# Patient Record
Sex: Male | Born: 1935 | Race: White | Hispanic: No | Smoking: Former smoker
Health system: Southern US, Community
[De-identification: ages and names within clinical notes are randomized; demographics above are authoritative.]

## PROBLEM LIST (undated history)

## (undated) DIAGNOSIS — K56609 Unspecified intestinal obstruction, unspecified as to partial versus complete obstruction: Secondary | ICD-10-CM

## (undated) DIAGNOSIS — I639 Cerebral infarction, unspecified: Secondary | ICD-10-CM

## (undated) DIAGNOSIS — I10 Essential (primary) hypertension: Secondary | ICD-10-CM

## (undated) DIAGNOSIS — C8590 Non-Hodgkin lymphoma, unspecified, unspecified site: Secondary | ICD-10-CM

## (undated) DIAGNOSIS — E119 Type 2 diabetes mellitus without complications: Secondary | ICD-10-CM

## (undated) HISTORY — PX: APPENDECTOMY: SHX54

## (undated) HISTORY — PX: GALLBLADDER SURGERY: SHX652

## (undated) HISTORY — PX: CORONARY ARTERY BYPASS GRAFT: SHX141

## (undated) HISTORY — PX: SPLENECTOMY: SUR1306

## (undated) HISTORY — DX: Cerebral infarction, unspecified: I63.9

---

## 2004-10-03 ENCOUNTER — Ambulatory Visit: Payer: Self-pay | Admitting: Oncology

## 2004-10-22 ENCOUNTER — Ambulatory Visit: Payer: Self-pay | Admitting: Oncology

## 2005-01-02 ENCOUNTER — Ambulatory Visit: Payer: Self-pay | Admitting: Oncology

## 2005-01-22 ENCOUNTER — Ambulatory Visit: Payer: Self-pay | Admitting: Oncology

## 2005-02-19 ENCOUNTER — Ambulatory Visit: Payer: Self-pay | Admitting: Oncology

## 2005-03-22 ENCOUNTER — Ambulatory Visit: Payer: Self-pay | Admitting: Oncology

## 2005-05-08 ENCOUNTER — Ambulatory Visit: Payer: Self-pay | Admitting: Oncology

## 2005-05-22 ENCOUNTER — Ambulatory Visit: Payer: Self-pay | Admitting: Oncology

## 2005-08-07 ENCOUNTER — Ambulatory Visit: Payer: Self-pay | Admitting: Oncology

## 2005-08-22 ENCOUNTER — Ambulatory Visit: Payer: Self-pay | Admitting: Oncology

## 2005-11-05 ENCOUNTER — Ambulatory Visit: Payer: Self-pay | Admitting: Oncology

## 2005-11-21 ENCOUNTER — Ambulatory Visit: Payer: Self-pay | Admitting: Oncology

## 2006-03-05 ENCOUNTER — Ambulatory Visit: Payer: Self-pay | Admitting: Oncology

## 2006-03-22 ENCOUNTER — Ambulatory Visit: Payer: Self-pay | Admitting: Oncology

## 2006-09-01 ENCOUNTER — Ambulatory Visit: Payer: Self-pay | Admitting: Ophthalmology

## 2006-09-09 ENCOUNTER — Ambulatory Visit: Payer: Self-pay | Admitting: Ophthalmology

## 2006-10-16 ENCOUNTER — Ambulatory Visit: Payer: Self-pay | Admitting: Oncology

## 2006-10-22 ENCOUNTER — Ambulatory Visit: Payer: Self-pay | Admitting: Oncology

## 2007-03-23 ENCOUNTER — Ambulatory Visit: Payer: Self-pay | Admitting: Oncology

## 2007-04-15 ENCOUNTER — Ambulatory Visit: Payer: Self-pay | Admitting: Oncology

## 2007-04-22 ENCOUNTER — Ambulatory Visit: Payer: Self-pay | Admitting: Oncology

## 2007-06-15 ENCOUNTER — Other Ambulatory Visit: Payer: Self-pay

## 2007-06-15 ENCOUNTER — Ambulatory Visit: Payer: Self-pay | Admitting: Ophthalmology

## 2007-06-23 ENCOUNTER — Ambulatory Visit: Payer: Self-pay | Admitting: Ophthalmology

## 2007-09-22 ENCOUNTER — Ambulatory Visit: Payer: Self-pay | Admitting: Oncology

## 2007-10-14 ENCOUNTER — Ambulatory Visit: Payer: Self-pay | Admitting: Oncology

## 2007-10-23 ENCOUNTER — Ambulatory Visit: Payer: Self-pay | Admitting: Oncology

## 2008-03-22 ENCOUNTER — Ambulatory Visit: Payer: Self-pay | Admitting: Oncology

## 2008-04-14 ENCOUNTER — Ambulatory Visit: Payer: Self-pay | Admitting: Oncology

## 2008-04-21 ENCOUNTER — Ambulatory Visit: Payer: Self-pay | Admitting: Oncology

## 2008-10-22 ENCOUNTER — Ambulatory Visit: Payer: Self-pay | Admitting: Oncology

## 2008-10-26 ENCOUNTER — Ambulatory Visit: Payer: Self-pay | Admitting: Oncology

## 2008-11-21 ENCOUNTER — Ambulatory Visit: Payer: Self-pay | Admitting: Oncology

## 2009-02-19 ENCOUNTER — Ambulatory Visit: Payer: Self-pay | Admitting: Oncology

## 2009-02-27 ENCOUNTER — Ambulatory Visit: Payer: Self-pay | Admitting: Oncology

## 2009-03-22 ENCOUNTER — Ambulatory Visit: Payer: Self-pay | Admitting: Oncology

## 2009-04-17 ENCOUNTER — Ambulatory Visit: Payer: Self-pay | Admitting: Unknown Physician Specialty

## 2009-04-21 ENCOUNTER — Ambulatory Visit: Payer: Self-pay | Admitting: Oncology

## 2009-05-22 ENCOUNTER — Ambulatory Visit: Payer: Self-pay | Admitting: Oncology

## 2009-06-21 ENCOUNTER — Ambulatory Visit: Payer: Self-pay | Admitting: Oncology

## 2009-07-22 ENCOUNTER — Ambulatory Visit: Payer: Self-pay | Admitting: Oncology

## 2009-08-22 ENCOUNTER — Ambulatory Visit: Payer: Self-pay | Admitting: Internal Medicine

## 2009-08-30 ENCOUNTER — Ambulatory Visit: Payer: Self-pay | Admitting: Internal Medicine

## 2009-09-21 ENCOUNTER — Ambulatory Visit: Payer: Self-pay | Admitting: Internal Medicine

## 2009-10-22 ENCOUNTER — Ambulatory Visit: Payer: Self-pay | Admitting: Internal Medicine

## 2009-11-21 ENCOUNTER — Ambulatory Visit: Payer: Self-pay | Admitting: Internal Medicine

## 2009-12-06 ENCOUNTER — Ambulatory Visit: Payer: Self-pay | Admitting: Internal Medicine

## 2009-12-22 ENCOUNTER — Ambulatory Visit: Payer: Self-pay | Admitting: Internal Medicine

## 2010-01-22 ENCOUNTER — Ambulatory Visit: Payer: Self-pay | Admitting: Internal Medicine

## 2010-01-29 ENCOUNTER — Ambulatory Visit: Payer: Self-pay | Admitting: Internal Medicine

## 2010-02-19 ENCOUNTER — Ambulatory Visit: Payer: Self-pay | Admitting: Internal Medicine

## 2010-03-22 ENCOUNTER — Ambulatory Visit: Payer: Self-pay | Admitting: Internal Medicine

## 2010-03-26 ENCOUNTER — Ambulatory Visit: Payer: Self-pay | Admitting: Internal Medicine

## 2010-04-21 ENCOUNTER — Ambulatory Visit: Payer: Self-pay | Admitting: Internal Medicine

## 2010-06-21 ENCOUNTER — Ambulatory Visit: Payer: Self-pay | Admitting: Internal Medicine

## 2010-07-04 ENCOUNTER — Ambulatory Visit: Payer: Self-pay | Admitting: Internal Medicine

## 2010-07-22 ENCOUNTER — Ambulatory Visit: Payer: Self-pay | Admitting: Internal Medicine

## 2010-10-08 ENCOUNTER — Ambulatory Visit: Payer: Self-pay | Admitting: Internal Medicine

## 2010-10-22 ENCOUNTER — Ambulatory Visit: Payer: Self-pay | Admitting: Internal Medicine

## 2011-02-11 ENCOUNTER — Ambulatory Visit: Payer: Self-pay | Admitting: Internal Medicine

## 2011-02-20 ENCOUNTER — Ambulatory Visit: Payer: Self-pay | Admitting: Internal Medicine

## 2011-04-01 ENCOUNTER — Emergency Department: Payer: Self-pay | Admitting: Emergency Medicine

## 2011-08-12 ENCOUNTER — Ambulatory Visit: Payer: Self-pay | Admitting: Internal Medicine

## 2011-08-23 ENCOUNTER — Ambulatory Visit: Payer: Self-pay | Admitting: Internal Medicine

## 2012-02-12 ENCOUNTER — Ambulatory Visit: Payer: Self-pay | Admitting: Oncology

## 2012-02-12 LAB — RETICULOCYTES
Absolute Retic Count: 0.096 10*6/uL — ABNORMAL HIGH (ref 0.024–0.084)
Reticulocyte: 2 % — ABNORMAL HIGH (ref 0.5–1.5)

## 2012-02-12 LAB — CBC CANCER CENTER
Basophil #: 0 x10 3/mm (ref 0.0–0.1)
Basophil %: 0.5 %
Eosinophil #: 0.2 x10 3/mm (ref 0.0–0.7)
Eosinophil %: 2.1 %
Lymphocyte #: 2.9 x10 3/mm (ref 1.0–3.6)
MCV: 94 fL (ref 80–100)
Monocyte #: 0.8 x10 3/mm — ABNORMAL HIGH (ref 0.0–0.7)
Neutrophil #: 3.4 x10 3/mm (ref 1.4–6.5)
Neutrophil %: 47 %
Platelet: 264 x10 3/mm (ref 150–440)
RBC: 4.92 10*6/uL (ref 4.40–5.90)
RDW: 13.9 % (ref 11.5–14.5)
WBC: 7.3 x10 3/mm (ref 3.8–10.6)

## 2012-02-12 LAB — COMPREHENSIVE METABOLIC PANEL
Albumin: 3.8 g/dL (ref 3.4–5.0)
Alkaline Phosphatase: 73 U/L (ref 50–136)
Anion Gap: 6 — ABNORMAL LOW (ref 7–16)
BUN: 19 mg/dL — ABNORMAL HIGH (ref 7–18)
Bilirubin,Total: 0.5 mg/dL (ref 0.2–1.0)
Co2: 30 mmol/L (ref 21–32)
Creatinine: 1.36 mg/dL — ABNORMAL HIGH (ref 0.60–1.30)
EGFR (Non-African Amer.): 53 — ABNORMAL LOW
Glucose: 188 mg/dL — ABNORMAL HIGH (ref 65–99)
Osmolality: 283 (ref 275–301)
Potassium: 4.2 mmol/L (ref 3.5–5.1)
SGPT (ALT): 41 U/L
Sodium: 138 mmol/L (ref 136–145)
Total Protein: 7.1 g/dL (ref 6.4–8.2)

## 2012-02-20 ENCOUNTER — Ambulatory Visit: Payer: Self-pay | Admitting: Oncology

## 2012-09-02 ENCOUNTER — Ambulatory Visit: Payer: Self-pay | Admitting: Oncology

## 2012-09-02 LAB — COMPREHENSIVE METABOLIC PANEL
Alkaline Phosphatase: 70 U/L (ref 50–136)
BUN: 17 mg/dL (ref 7–18)
Bilirubin,Total: 0.6 mg/dL (ref 0.2–1.0)
Creatinine: 1.17 mg/dL (ref 0.60–1.30)
EGFR (Non-African Amer.): 56 — ABNORMAL LOW
Potassium: 4.2 mmol/L (ref 3.5–5.1)
SGPT (ALT): 34 U/L (ref 12–78)
Sodium: 141 mmol/L (ref 136–145)
Total Protein: 7 g/dL (ref 6.4–8.2)

## 2012-09-02 LAB — CBC CANCER CENTER
Basophil %: 0.9 %
Eosinophil #: 0.1 x10 3/mm (ref 0.0–0.7)
HCT: 45.9 % (ref 40.0–52.0)
HGB: 15.5 g/dL (ref 13.0–18.0)
Lymphocyte #: 2.3 x10 3/mm (ref 1.0–3.6)
MCH: 31.6 pg (ref 26.0–34.0)
MCHC: 33.8 g/dL (ref 32.0–36.0)
Monocyte #: 0.8 x10 3/mm (ref 0.2–1.0)
Neutrophil #: 3.7 x10 3/mm (ref 1.4–6.5)
Neutrophil %: 52.9 %

## 2012-09-21 ENCOUNTER — Ambulatory Visit: Payer: Self-pay | Admitting: Oncology

## 2012-12-29 LAB — CBC
HGB: 17.3 g/dL (ref 13.0–18.0)
MCH: 31.6 pg (ref 26.0–34.0)
MCHC: 33.6 g/dL (ref 32.0–36.0)
MCV: 94 fL (ref 80–100)
Platelet: 305 10*3/uL (ref 150–440)
RBC: 5.49 10*6/uL (ref 4.40–5.90)
RDW: 14.2 % (ref 11.5–14.5)
WBC: 15.6 10*3/uL — ABNORMAL HIGH (ref 3.8–10.6)

## 2012-12-29 LAB — COMPREHENSIVE METABOLIC PANEL
Alkaline Phosphatase: 92 U/L (ref 50–136)
Anion Gap: 13 (ref 7–16)
Bilirubin,Total: 1.2 mg/dL — ABNORMAL HIGH (ref 0.2–1.0)
Chloride: 101 mmol/L (ref 98–107)
Co2: 23 mmol/L (ref 21–32)
Creatinine: 1.56 mg/dL — ABNORMAL HIGH (ref 0.60–1.30)
EGFR (African American): 46 — ABNORMAL LOW
EGFR (Non-African Amer.): 39 — ABNORMAL LOW
Glucose: 283 mg/dL — ABNORMAL HIGH (ref 65–99)
Osmolality: 290 (ref 275–301)
Potassium: 4.3 mmol/L (ref 3.5–5.1)
SGOT(AST): 34 U/L (ref 15–37)
SGPT (ALT): 42 U/L (ref 12–78)
Total Protein: 8.4 g/dL — ABNORMAL HIGH (ref 6.4–8.2)

## 2012-12-29 LAB — LIPASE, BLOOD: Lipase: 87 U/L (ref 73–393)

## 2012-12-30 ENCOUNTER — Inpatient Hospital Stay: Payer: Self-pay | Admitting: Surgery

## 2012-12-30 LAB — URINALYSIS, COMPLETE
Bacteria: NONE SEEN
Blood: NEGATIVE
Glucose,UR: 150 mg/dL (ref 0–75)
Ketone: NEGATIVE
Leukocyte Esterase: NEGATIVE
Nitrite: NEGATIVE
Protein: 30
Squamous Epithelial: <1

## 2012-12-30 LAB — CBC WITH DIFFERENTIAL/PLATELET
Basophil #: 0 10*3/uL (ref 0.0–0.1)
Eosinophil #: 0 10*3/uL (ref 0.0–0.7)
Eosinophil %: 0 %
HCT: 48.9 % (ref 40.0–52.0)
HGB: 16.9 g/dL (ref 13.0–18.0)
MCH: 32.4 pg (ref 26.0–34.0)
MCHC: 34.5 g/dL (ref 32.0–36.0)
Monocyte #: 1.5 x10 3/mm — ABNORMAL HIGH (ref 0.2–1.0)
Monocyte %: 14.4 %
Neutrophil #: 7.6 10*3/uL — ABNORMAL HIGH (ref 1.4–6.5)
Platelet: 273 10*3/uL (ref 150–440)
RDW: 14 % (ref 11.5–14.5)

## 2012-12-30 LAB — COMPREHENSIVE METABOLIC PANEL
Alkaline Phosphatase: 76 U/L (ref 50–136)
BUN: 34 mg/dL — ABNORMAL HIGH (ref 7–18)
Calcium, Total: 9 mg/dL (ref 8.5–10.1)
Chloride: 99 mmol/L (ref 98–107)
EGFR (African American): 50 — ABNORMAL LOW
Osmolality: 287 (ref 275–301)
SGOT(AST): 27 U/L (ref 15–37)
SGPT (ALT): 35 U/L (ref 12–78)
Sodium: 137 mmol/L (ref 136–145)
Total Protein: 8.1 g/dL (ref 6.4–8.2)

## 2012-12-30 LAB — APTT: Activated PTT: 30.8 secs (ref 23.6–35.9)

## 2012-12-30 LAB — PROTIME-INR: INR: 1.1

## 2012-12-31 LAB — BASIC METABOLIC PANEL WITH GFR
Anion Gap: 8
BUN: 26 mg/dL — ABNORMAL HIGH
Calcium, Total: 7.9 mg/dL — ABNORMAL LOW
Chloride: 107 mmol/L
Co2: 28 mmol/L
Creatinine: 1.2 mg/dL
EGFR (African American): >60
EGFR (Non-African Amer.): 54 — ABNORMAL LOW
Glucose: 117 mg/dL — ABNORMAL HIGH
Osmolality: 291
Potassium: 3.9 mmol/L
Sodium: 143 mmol/L

## 2012-12-31 LAB — CBC WITH DIFFERENTIAL/PLATELET
Eosinophil %: 2 %
HCT: 44.7 % (ref 40.0–52.0)
HGB: 14.3 g/dL (ref 13.0–18.0)
Lymphocyte #: 2 10*3/uL (ref 1.0–3.6)
Lymphocyte %: 26.7 %
MCH: 30 pg (ref 26.0–34.0)
MCHC: 32 g/dL (ref 32.0–36.0)
MCV: 94 fL (ref 80–100)
Monocyte #: 1 x10 3/mm (ref 0.2–1.0)
Neutrophil #: 4.4 10*3/uL (ref 1.4–6.5)
Neutrophil %: 57.2 %
WBC: 7.6 10*3/uL (ref 3.8–10.6)

## 2013-03-02 ENCOUNTER — Ambulatory Visit: Payer: Self-pay | Admitting: Oncology

## 2013-03-03 LAB — BASIC METABOLIC PANEL
Anion Gap: 8 (ref 7–16)
BUN: 16 mg/dL (ref 7–18)
Co2: 30 mmol/L (ref 21–32)
Creatinine: 1.61 mg/dL — ABNORMAL HIGH (ref 0.60–1.30)
EGFR (Non-African Amer.): 38 — ABNORMAL LOW
Glucose: 197 mg/dL — ABNORMAL HIGH (ref 65–99)
Osmolality: 290 (ref 275–301)

## 2013-03-03 LAB — CBC CANCER CENTER
Basophil %: 1.3 %
Eosinophil #: 0.2 x10 3/mm (ref 0.0–0.7)
Eosinophil %: 1.5 %
HGB: 14.2 g/dL (ref 13.0–18.0)
Lymphocyte #: 3 x10 3/mm (ref 1.0–3.6)
Lymphocyte %: 25.4 %
MCH: 31 pg (ref 26.0–34.0)
MCHC: 33.5 g/dL (ref 32.0–36.0)
MCV: 93 fL (ref 80–100)
Monocyte %: 11.6 %
Platelet: 282 x10 3/mm (ref 150–440)
RDW: 14.5 % (ref 11.5–14.5)
WBC: 11.8 x10 3/mm — ABNORMAL HIGH (ref 3.8–10.6)

## 2013-03-22 ENCOUNTER — Ambulatory Visit: Payer: Self-pay | Admitting: Oncology

## 2013-08-30 ENCOUNTER — Ambulatory Visit: Payer: Self-pay | Admitting: Oncology

## 2013-08-30 LAB — COMPREHENSIVE METABOLIC PANEL
Albumin: 3.7 g/dL (ref 3.4–5.0)
Anion Gap: 7 (ref 7–16)
BUN: 16 mg/dL (ref 7–18)
Calcium, Total: 9.1 mg/dL (ref 8.5–10.1)
Creatinine: 1.18 mg/dL (ref 0.60–1.30)
EGFR (African American): >60
EGFR (Non-African Amer.): 55 — ABNORMAL LOW
Osmolality: 283 (ref 275–301)
SGPT (ALT): 40 U/L (ref 12–78)
Sodium: 140 mmol/L (ref 136–145)
Total Protein: 7.1 g/dL (ref 6.4–8.2)

## 2013-08-30 LAB — CBC CANCER CENTER
Basophil #: 0.1 x10 3/mm (ref 0.0–0.1)
Basophil %: 1.5 %
Eosinophil #: 0.1 x10 3/mm (ref 0.0–0.7)
Eosinophil %: 1.8 %
HCT: 44.7 % (ref 40.0–52.0)
Lymphocyte %: 29.2 %
MCH: 31.2 pg (ref 26.0–34.0)
MCV: 93 fL (ref 80–100)
Monocyte #: 0.8 x10 3/mm (ref 0.2–1.0)
Monocyte %: 9.6 %
Neutrophil %: 57.9 %
Platelet: 287 x10 3/mm (ref 150–440)
RDW: 14 % (ref 11.5–14.5)
WBC: 8.5 x10 3/mm (ref 3.8–10.6)

## 2013-09-21 ENCOUNTER — Ambulatory Visit: Payer: Self-pay | Admitting: Oncology

## 2014-03-08 ENCOUNTER — Ambulatory Visit: Payer: Self-pay | Admitting: Oncology

## 2014-03-08 LAB — COMPREHENSIVE METABOLIC PANEL
ANION GAP: 10 (ref 7–16)
AST: 38 U/L — AB (ref 15–37)
Albumin: 3.6 g/dL (ref 3.4–5.0)
Alkaline Phosphatase: 72 U/L
BILIRUBIN TOTAL: 0.5 mg/dL (ref 0.2–1.0)
BUN: 24 mg/dL — AB (ref 7–18)
CALCIUM: 9.5 mg/dL (ref 8.5–10.1)
Chloride: 104 mmol/L (ref 98–107)
Co2: 28 mmol/L (ref 21–32)
Creatinine: 1.2 mg/dL (ref 0.60–1.30)
EGFR (African American): >60
GFR CALC NON AF AMER: 54 — AB
Glucose: 128 mg/dL — ABNORMAL HIGH (ref 65–99)
Osmolality: 289 (ref 275–301)
POTASSIUM: 4.7 mmol/L (ref 3.5–5.1)
SGPT (ALT): 36 U/L (ref 12–78)
SODIUM: 142 mmol/L (ref 136–145)
TOTAL PROTEIN: 7.2 g/dL (ref 6.4–8.2)

## 2014-03-08 LAB — CBC CANCER CENTER
BASOS ABS: 0.1 x10 3/mm (ref 0.0–0.1)
Basophil %: 1 %
Eosinophil #: 0.3 x10 3/mm (ref 0.0–0.7)
Eosinophil %: 2.9 %
HCT: 45.6 % (ref 40.0–52.0)
HGB: 14.7 g/dL (ref 13.0–18.0)
Lymphocyte #: 2.9 x10 3/mm (ref 1.0–3.6)
Lymphocyte %: 30.3 %
MCH: 30.2 pg (ref 26.0–34.0)
MCHC: 32.2 g/dL (ref 32.0–36.0)
MCV: 94 fL (ref 80–100)
Monocyte #: 0.9 x10 3/mm (ref 0.2–1.0)
Monocyte %: 9.3 %
NEUTROS ABS: 5.4 x10 3/mm (ref 1.4–6.5)
Neutrophil %: 56.5 %
Platelet: 294 x10 3/mm (ref 150–440)
RBC: 4.86 10*6/uL (ref 4.40–5.90)
RDW: 13.9 % (ref 11.5–14.5)
WBC: 9.5 x10 3/mm (ref 3.8–10.6)

## 2014-03-22 ENCOUNTER — Ambulatory Visit: Payer: Self-pay | Admitting: Oncology

## 2014-08-24 DIAGNOSIS — Z951 Presence of aortocoronary bypass graft: Secondary | ICD-10-CM | POA: Insufficient documentation

## 2014-08-24 DIAGNOSIS — E119 Type 2 diabetes mellitus without complications: Secondary | ICD-10-CM | POA: Insufficient documentation

## 2014-08-24 DIAGNOSIS — E785 Hyperlipidemia, unspecified: Secondary | ICD-10-CM | POA: Insufficient documentation

## 2014-08-24 DIAGNOSIS — I10 Essential (primary) hypertension: Secondary | ICD-10-CM | POA: Insufficient documentation

## 2014-09-12 ENCOUNTER — Ambulatory Visit: Payer: Self-pay | Admitting: Oncology

## 2014-10-10 ENCOUNTER — Ambulatory Visit: Payer: Self-pay | Admitting: Oncology

## 2014-10-10 LAB — COMPREHENSIVE METABOLIC PANEL
ALBUMIN: 3.7 g/dL (ref 3.4–5.0)
ALK PHOS: 67 U/L
Anion Gap: 4 — ABNORMAL LOW (ref 7–16)
BUN: 20 mg/dL — AB (ref 7–18)
Bilirubin,Total: 0.8 mg/dL (ref 0.2–1.0)
CO2: 31 mmol/L (ref 21–32)
Calcium, Total: 8.8 mg/dL (ref 8.5–10.1)
Chloride: 105 mmol/L (ref 98–107)
Creatinine: 1.09 mg/dL (ref 0.60–1.30)
EGFR (African American): >60
GLUCOSE: 95 mg/dL (ref 65–99)
OSMOLALITY: 282 (ref 275–301)
POTASSIUM: 4.5 mmol/L (ref 3.5–5.1)
SGOT(AST): 37 U/L (ref 15–37)
SGPT (ALT): 40 U/L
Sodium: 140 mmol/L (ref 136–145)
Total Protein: 7.3 g/dL (ref 6.4–8.2)

## 2014-10-10 LAB — CBC CANCER CENTER
BASOS ABS: 0.1 x10 3/mm (ref 0.0–0.1)
Basophil %: 1.3 %
EOS PCT: 2.8 %
Eosinophil #: 0.2 x10 3/mm (ref 0.0–0.7)
HCT: 45.2 % (ref 40.0–52.0)
HGB: 14.8 g/dL (ref 13.0–18.0)
Lymphocyte #: 2.9 x10 3/mm (ref 1.0–3.6)
Lymphocyte %: 34.7 %
MCH: 31.3 pg (ref 26.0–34.0)
MCHC: 32.7 g/dL (ref 32.0–36.0)
MCV: 96 fL (ref 80–100)
MONO ABS: 0.9 x10 3/mm (ref 0.2–1.0)
MONOS PCT: 11 %
NEUTROS ABS: 4.2 x10 3/mm (ref 1.4–6.5)
Neutrophil %: 50.2 %
Platelet: 255 x10 3/mm (ref 150–440)
RBC: 4.72 10*6/uL (ref 4.40–5.90)
RDW: 13.9 % (ref 11.5–14.5)
WBC: 8.3 x10 3/mm (ref 3.8–10.6)

## 2014-10-10 LAB — LACTATE DEHYDROGENASE: LDH: 221 U/L (ref 85–241)

## 2014-10-12 ENCOUNTER — Emergency Department: Payer: Self-pay | Admitting: Emergency Medicine

## 2014-10-22 ENCOUNTER — Ambulatory Visit: Payer: Self-pay | Admitting: Oncology

## 2014-12-26 DIAGNOSIS — M5430 Sciatica, unspecified side: Secondary | ICD-10-CM | POA: Insufficient documentation

## 2014-12-26 DIAGNOSIS — G5600 Carpal tunnel syndrome, unspecified upper limb: Secondary | ICD-10-CM | POA: Insufficient documentation

## 2014-12-26 DIAGNOSIS — M21379 Foot drop, unspecified foot: Secondary | ICD-10-CM | POA: Insufficient documentation

## 2015-01-08 DIAGNOSIS — R29898 Other symptoms and signs involving the musculoskeletal system: Secondary | ICD-10-CM | POA: Insufficient documentation

## 2015-04-12 DIAGNOSIS — R208 Other disturbances of skin sensation: Secondary | ICD-10-CM | POA: Insufficient documentation

## 2015-04-13 NOTE — H&P (Signed)
PATIENT NAME:  Zachary Spence, Zachary Spence MR#:  [id number] DATE OF BIRTH:  [birth year]  DATE OF ADMISSION:  12/30/2012  HISTORY OF PRESENT ILLNESS:  The patient is an 81 year old reasonably healthy white male who was doing fine until approximately 24 hours ago.  He had a normal bowel movement the day before yesterday and a normal bowel movement yesterday and was passing flatus the day before yesterday, but does not recall passing flatus in the last 18 to 24 hours.  He experienced some abdominal pain, nausea and vomiting and sought attention in the Emergency Department.   PAST MEDICAL HISTORY:  Diabetes mellitus since 2003, status post coronary artery bypass graft in 1997, peptic ulcer disease with gastrointestinal hemorrhage in 1981, status post bilateral inguinal hernia repairs by Dr. Rochel Brome, status post splenectomy 2003 by Dr. Rochel Brome, status post appendectomy 1961, status post prostate biopsy 1998, autoimmune hemolytic anemia.   MEDICATIONS:  Tonic water 2 ounces daily for leg cramps, fish oil 1 daily, multivitamin 1 daily, calcium 1 daily, Lipitor 40 mg at bedtime, folic acid 1 daily, glyburide 6 mg daily, atenolol 25 mg daily, PreserVision antioxidant one twice daily.   ALLERGIES:  ERYTHROMYCIN.   FAMILY HISTORY:  Noncontributory.   SOCIAL HISTORY:  The patient's wife died 6-1/2 years ago and he lives alone in a large house.  He is completely independent.  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  He does not drink alcohol.  He is attended in the Emergency Department by his son who lives in [place].   REVIEW OF SYSTEMS:  Negative for 10 systems, except as mentioned above in the history of present illness.  Specifically, the patient has not had any blood in his vomit or his bowel movements, and denies fever.  His pain is well-controlled in the Emergency Department by the analgesics administered to him here.  Since being in the Emergency  Department, he has had a nasogastric tube placed to low continuous suction and has had over a liter of dark fluid (nonbloody) in output and he is feeling better.   PHYSICAL EXAMINATION: GENERAL:  Healthy very pleasant elderly white male, height 6 feet 0 inches, weight 200 pounds, BMI 27.2.   VITAL SIGNS:  Temperature 97.6, pulse 100, respirations 16, blood pressure 148/78, oxygen saturation 94% on room air at rest.  HEENT:  Pupils equally round and reactive to light.  Extraocular movements intact.  Sclerae are anicteric.  Oropharynx clear.  Mucous membranes moist.  Hearing intact to voice.  NECK:  Supple with no tracheal deviation or jugular venous distention.  HEART:  Regular rate and rhythm with no murmurs or rubs.  LUNGS:  Clear to auscultation with normal respiratory effort bilaterally.  ABDOMEN:  Nondistended, mildly tender with no rebound, tenderness or guarding and some significant tympany despite the mild distention.  EXTREMITIES:  No edema, normal capillary refill bilaterally.  NEUROLOGIC:  Cranial nerves II-XII, motor and sensation grossly intact.  PSYCHIATRIC:  Alert and oriented x 4.  Appropriate affect.   LABORATORY, DIAGNOSTIC, AND RADIOLOGICAL DATA:  Glucose 283, BUN 28, creatinine 1.6.  Electrolytes normal.  Lipase normal.  Hepatic profile essentially normal.  White blood cell count 15.6, hemoglobin 17.3, hematocrit 51.5%, platelet count 305,000.  CT scan of the abdomen and pelvis without contrast is consistent with a small bowel obstruction of uncertain etiology.  There is no ascites or abscess.  I reviewed the films and concur as there appears to be  1 or 2 long loops of proximal small intestine that is mildly to moderately dilated and gas-filled.  The terminal ileum and colon are essentially decompressed and without gas.   ASSESSMENT:  Small bowel obstruction in a patient with multiple previous abdominal surgeries.   PLAN:  Admit to the hospital for IV fluid hydration, analgesia,  antiemetics and continue nasogastric suction and observe.     ____________________________ Consuela Mimes, MD wfm:ea D: 12/30/2012 02:16:12 ET T: 12/30/2012 04:54:04 ET JOB#: [id number]  cc: Consuela Mimes, MD, <Dictator> Consuela Mimes MD ELECTRONICALLY SIGNED 12/30/2012 19:42

## 2015-04-13 NOTE — Discharge Summary (Signed)
PATIENT NAME:  Zachary Spence, Zachary Spence MR#:  [id number] DATE OF BIRTH:  April 11, 1923  DATE OF ADMISSION:  12/30/2012 DATE OF DISCHARGE:  01/01/2013  BRIEF HISTORY:  The patient is an 81 year old gentleman seen through the Emergency Room with a clinical presentation and imaging suggestive of partial small bowel obstruction. He was admitted to the hospital, placed on nasogastric decompression. His symptoms resolved almost immediately. He was able to pass gas and have a small bowel movement. Plain films improved very slowly. Nasogastric tube was discontinued on the 10th.  He was begun on a  liquid diet and advanced to a soft diet today. He was tolerating a diet without difficulty. His plain films continued to improve. He has no sign of any further obstruction with a benign abdominal examination.  We will discharge him home today, follow him up in the office as necessary.   DISCHARGE MEDICATIONS: 1.  Atenolol 25 mg p.o. daily.  2.  Glyburide 6 mg p.o. daily.  3.  Folic acid gas 1 mg p.o. daily.  4.  Lipitor 40 mg p.o. daily.  5.  Calcium 1 tablet every day. 7.  Multivitamins 1 tablet every day.  8.  Fish oil capsules once a day.  9.  Tonic water 2 ounces daily. 10.  PreserVision antioxidant 1 tablet b.i.d.   FINAL DISCHARGE DIAGNOSIS:  Partial small bowel obstruction.   SURGERY:  None.    ____________________________ Micheline Maze, MD rle:ct D: 01/01/2013 11:50:33 ET T: 01/02/2013 10:41:04 ET JOB#: [id number]  cc: Micheline Maze, MD, <Dictator> [place] MD ELECTRONICALLY SIGNED 01/07/2013 8:48

## 2015-04-18 ENCOUNTER — Other Ambulatory Visit: Payer: Self-pay | Admitting: *Deleted

## 2015-04-18 ENCOUNTER — Ambulatory Visit
Admit: 2015-04-18 | Disposition: A | Discharge status: Home / Self Care | Payer: Self-pay | Attending: Oncology | Admitting: Oncology

## 2015-04-18 DIAGNOSIS — D649 Anemia, unspecified: Secondary | ICD-10-CM

## 2015-04-18 LAB — CBC CANCER CENTER
BASOS PCT: 1 %
Basophil #: 0.1 x10 3/mm (ref 0.0–0.1)
EOS ABS: 0.1 x10 3/mm (ref 0.0–0.7)
Eosinophil %: 2 %
HCT: 43.9 % (ref 40.0–52.0)
HGB: 14.5 g/dL (ref 13.0–18.0)
LYMPHS PCT: 35.1 %
Lymphocyte #: 2.6 x10 3/mm (ref 1.0–3.6)
MCH: 31 pg (ref 26.0–34.0)
MCHC: 33 g/dL (ref 32.0–36.0)
MCV: 94 fL (ref 80–100)
MONOS PCT: 10.7 %
Monocyte #: 0.8 x10 3/mm (ref 0.2–1.0)
NEUTROS ABS: 3.8 x10 3/mm (ref 1.4–6.5)
NEUTROS PCT: 51.2 %
Platelet: 275 x10 3/mm (ref 150–440)
RBC: 4.67 10*6/uL (ref 4.40–5.90)
RDW: 14.5 % (ref 11.5–14.5)
WBC: 7.5 x10 3/mm (ref 3.8–10.6)

## 2015-04-18 LAB — COMPREHENSIVE METABOLIC PANEL
ALK PHOS: 59 U/L
ANION GAP: 5 — AB (ref 7–16)
AST: 28 U/L
Albumin: 4.2 g/dL
BUN: 20 mg/dL
Bilirubin,Total: 0.8 mg/dL
CALCIUM: 8.8 mg/dL — AB
CREATININE: 1.05 mg/dL
Chloride: 106 mmol/L
Co2: 27 mmol/L
EGFR (African American): >60
EGFR (Non-African Amer.): >60
Glucose: 93 mg/dL
POTASSIUM: 4 mmol/L
SGPT (ALT): 26 U/L
SODIUM: 138 mmol/L
Total Protein: 7.3 g/dL

## 2015-10-18 ENCOUNTER — Inpatient Hospital Stay: Payer: Medicare Other | Attending: Oncology | Admitting: Oncology

## 2015-10-18 ENCOUNTER — Encounter: Payer: Self-pay | Admitting: Oncology

## 2015-10-18 ENCOUNTER — Inpatient Hospital Stay: Payer: Medicare Other

## 2015-10-18 VITALS — BP 135/74 | HR 90 | Temp 96.6°F | Wt 184.1 lb

## 2015-10-18 DIAGNOSIS — M21379 Foot drop, unspecified foot: Secondary | ICD-10-CM

## 2015-10-18 DIAGNOSIS — Z9081 Acquired absence of spleen: Secondary | ICD-10-CM | POA: Diagnosis not present

## 2015-10-18 DIAGNOSIS — D591 Autoimmune hemolytic anemia, unspecified: Secondary | ICD-10-CM

## 2015-10-18 DIAGNOSIS — D649 Anemia, unspecified: Secondary | ICD-10-CM

## 2015-10-18 DIAGNOSIS — G629 Polyneuropathy, unspecified: Secondary | ICD-10-CM | POA: Insufficient documentation

## 2015-10-18 DIAGNOSIS — I2510 Atherosclerotic heart disease of native coronary artery without angina pectoris: Secondary | ICD-10-CM | POA: Diagnosis not present

## 2015-10-18 DIAGNOSIS — D72820 Lymphocytosis (symptomatic): Secondary | ICD-10-CM | POA: Diagnosis not present

## 2015-10-18 DIAGNOSIS — E119 Type 2 diabetes mellitus without complications: Secondary | ICD-10-CM | POA: Insufficient documentation

## 2015-10-18 DIAGNOSIS — Z7984 Long term (current) use of oral hypoglycemic drugs: Secondary | ICD-10-CM | POA: Diagnosis not present

## 2015-10-18 DIAGNOSIS — Z9221 Personal history of antineoplastic chemotherapy: Secondary | ICD-10-CM | POA: Insufficient documentation

## 2015-10-18 DIAGNOSIS — Z87891 Personal history of nicotine dependence: Secondary | ICD-10-CM | POA: Diagnosis not present

## 2015-10-18 DIAGNOSIS — Z8711 Personal history of peptic ulcer disease: Secondary | ICD-10-CM

## 2015-10-18 DIAGNOSIS — Z923 Personal history of irradiation: Secondary | ICD-10-CM

## 2015-10-18 DIAGNOSIS — Z8572 Personal history of non-Hodgkin lymphomas: Secondary | ICD-10-CM | POA: Diagnosis not present

## 2015-10-18 DIAGNOSIS — Z79899 Other long term (current) drug therapy: Secondary | ICD-10-CM | POA: Insufficient documentation

## 2015-10-18 DIAGNOSIS — Z23 Encounter for immunization: Secondary | ICD-10-CM | POA: Diagnosis not present

## 2015-10-18 LAB — CBC WITH DIFFERENTIAL/PLATELET
BASOS ABS: 0.1 10*3/uL (ref 0–0.1)
BASOS PCT: 1 %
EOS PCT: 2 %
Eosinophils Absolute: 0.2 10*3/uL (ref 0–0.7)
HEMATOCRIT: 45.7 % (ref 40.0–52.0)
Hemoglobin: 15.3 g/dL (ref 13.0–18.0)
LYMPHS PCT: 18 %
Lymphs Abs: 1.9 10*3/uL (ref 1.0–3.6)
MCH: 30.8 pg (ref 26.0–34.0)
MCHC: 33.5 g/dL (ref 32.0–36.0)
MCV: 91.8 fL (ref 80.0–100.0)
MONO ABS: 1.1 10*3/uL — AB (ref 0.2–1.0)
MONOS PCT: 10 %
NEUTROS ABS: 7.5 10*3/uL — AB (ref 1.4–6.5)
Neutrophils Relative %: 69 %
PLATELETS: 275 10*3/uL (ref 150–440)
RBC: 4.97 MIL/uL (ref 4.40–5.90)
RDW: 14 % (ref 11.5–14.5)
WBC: 10.8 10*3/uL — ABNORMAL HIGH (ref 3.8–10.6)

## 2015-10-18 LAB — BASIC METABOLIC PANEL
Anion gap: 8 (ref 5–15)
BUN: 12 mg/dL (ref 6–20)
CO2: 28 mmol/L (ref 22–32)
CREATININE: 0.95 mg/dL (ref 0.61–1.24)
Calcium: 8.6 mg/dL — ABNORMAL LOW (ref 8.9–10.3)
Chloride: 102 mmol/L (ref 101–111)
GFR calc Af Amer: >60 mL/min (ref >60)
GLUCOSE: 162 mg/dL — AB (ref 65–99)
Potassium: 3.6 mmol/L (ref 3.5–5.1)
Sodium: 138 mmol/L (ref 135–145)

## 2015-10-18 MED ORDER — INFLUENZA VAC SPLIT QUAD 0.5 ML IM SUSY
0.5000 mL | PREFILLED_SYRINGE | Freq: Once | INTRAMUSCULAR | Status: AC
  Administered 2015-10-18: 0.5 mL via INTRAMUSCULAR
  Filled 2015-10-18: qty 0.5

## 2015-10-18 NOTE — Progress Notes (Signed)
[place] @ [facility] [phone]  Fax:([phone]) [place]: 01-Apr-1923  MR#: [id number]  XNT#:[id number]  Patient Care Team: Melynda Ripple, MD as PCP - General (Internal Medicine)  CHIEF COMPLAINT:  Chief Complaint  Patient presents with  . OTHER  Chief Complaint/Problem List  1.  Autoimmune hemolytic anemia.  2.  Status post splenectomy.  3.  Status post Rituxan therapy.  4.  Progressive anemia in January 2006. Received Rituxan weekly for 4 weeks as well as quick prednisone taper with a complete response.  Previous work up:  Urine immunoelectrophoresis: No M spike. Serum immunoelectrophoresis: IgG 814, IgA 223, IgM 338, Immunofixation IgA, monoclonal protein with kappa light chain specificity.  CT scan of chest, abdomen, pelvis notable for splenomegaly. No lymphadenopathy in the chest, abdomen, or pelvis.  No liver lesions.  Direct Coombs: IgG negative.  C3B, C3D: Positive (pretransfusion sample). Heptoglobin: Under 15.  Reticulocyte count: 89. LDH: 463. Direct Coombs: IgG negative.  C3B, C3D: Positive (post transfusion sample). Cold agglutinin: 1:128. HCV: AB-  Bone Marrow biopsy: Extensive lymphoid replacement of marrow supporting diagnosis of lymphoma, but mixture of T and B cells and variability in B-cells makes classification difficult. Favor interpretation of aggressive B-cell lymphoma, but clinical correlation and LN biopsy would be of value, however, this is a difficult case to interpret.  Bone marrow biopsy (repeat): No evidence of lymphoma.  Spleen: Normal splenic tissue, no evidence of malignanc   INTERVAL HISTORY:   81 year-old gentleman came today further follow-up regarding autoimmune hemolytic anemia and lymphoproliferative disease.. Patient is here for ongoing evaluation and treatment consideration.  Patient has recently moved to [facility] to be  with his son. Feeling stronger.  Still very active.  Appetite has been  stable. Patient desires flu vaccine  REVIEW OF SYSTEMS:   GENERAL:  Feels good.  Active.  No fevers, sweats or weight loss. PERFORMANCE STATUS (ECOG):0 HEENT:  No visual changes, runny nose, sore throat, mouth sores or tenderness. Lungs: No shortness of breath or cough.  No hemoptysis. Cardiac:  No chest pain, palpitations, orthopnea, or PND. GI:  No nausea, vomiting, diarrhea, constipation, melena or hematochezia. GU:  No urgency, frequency, dysuria, or hematuria. Musculoskeletal:  No back pain.  No joint pain.  No muscle tenderness. Extremities:  No pain or swelling. Skin:  No rashes or skin changes. Neuro:  No headache, numbness or weakness, balance or coordination issues. Endocrine:  No diabetes, thyroid issues, hot flashes or night sweats. Psych:  No mood changes, depression or anxiety. Pain:  No focal pain. Review of systems:  All other systems reviewed and found to be negative. As per HPI. Otherwise, a complete review of systems is negatve.  Significant History/PMH:   Diabetes mellitus, diagnosednin 3/03:    Coronary artery disease,status post bypass in 1997:    Peptic ulcer disease with GI bleed in 1981:    Status post hernia repair:    Status post splenectomy 03/2002:    Status post appendectomy in 1961:    Status post prostate biopsy in 1998:    Status post bypass surgery in 8/97:   [place]: Additional Past Medical and Surgical History: Past Medical History   Peptic ulcer disease with GI bleed in 1981.    Coronary artery disease, status post bypass in 1997.    Diabetes mellitus, diagnosed in 3/03.     Past Surgical History   Status post bypass surgery in 8/97.    Status post prostate biopsy in  1998.    Status post appendectomy in 1961.   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   Status post hernia repair.     Family History   No family history of colorectal cancer, breast cancer, or ovarian cancer. No history of blood clots, liver disease, anemia, or diabetes.       Social History   Positive for tobacco use from [phone]. No alcohol use. No radiation exposure. No recreational drug use.  Positive for blood transfusion in 1981 related to a bleeding peptic ulcer.   ADVANCED DIRECTIVES:  Patient does not have any living will or healthcare power of attorney.  Information was given .  Available resources had been discussed.  We will follow-up on subsequent appointments regarding this issue HEALTH MAINTENANCE: Social History  Substance Use Topics  . Smoking status: Former Research scientist (life sciences)  . Smokeless tobacco: None  . Alcohol Use: None      Allergies  Allergen Reactions  . Erythromycin Other (See Comments)    Current Outpatient Prescriptions  Medication Sig Dispense Refill  . atenolol (TENORMIN) 25 MG tablet TAKE 1 TABLET EVERY DAY    . atorvastatin (LIPITOR) 10 MG tablet Take by mouth.    . calcium carbonate (TUMS EX) 750 MG chewable tablet Chew by mouth.    Marland Kitchen glipiZIDE (GLUCOTROL) 5 MG tablet TAKE ONE TABLET DAILY WITH FOOD FOR DIABETES    . Multiple Vitamins-Minerals (MULTIVITAMIN & MINERAL PO) Take by mouth.    . Multiple Vitamins-Minerals (PRESERVISION AREDS 2 PO) Take by mouth.    . Omega-3 Fatty Acids (FISH OIL) 1000 MG CAPS Take by mouth.    . Vitamin D, Ergocalciferol, (DRISDOL) 50000 UNITS CAPS capsule Take by mouth.     No current facility-administered medications for this visit.    OBJECTIVE:  Filed Vitals:   10/18/15 1121  BP: 135/74  Pulse: 90  Temp: 96.6 F (35.9 C)     There is no height on file to calculate BMI.    ECOG FS:0 - Asymptomatic  PHYSICAL EXAM: General  status: Performance status is good.  Patient has not lost significant weight. Since last evaluation there is no significant change in the general status HEENT: No evidence of stomatitis. Sclera and conjunctivae :: No jaundice.   pale looking. Lungs: Air  entry equal on both sides.  No rhonchi.  No rales.  Cardiac: Heart sounds are normal.  No pericardial rub.  No  murmur. Lymphatic system: Cervical, axillary, inguinal, lymph nodes not palpable GI: Abdomen is soft.liver and spleen not palpable.  No ascites.  Bowel sounds are normal.  No other palpable masses.  No tenderness . Lower extremity: No edema Neurological system: Higher functions, cranial nerves intact no evidence of peripheral neuropathy. Skin: No rash.  No ecchymosis.. No petechial hemorrhages   LAB RESULTS:  CBC Latest Ref Rng 10/18/2015 04/18/2015  WBC 3.8 - 10.6 K/uL 10.8(H) 7.5  Hemoglobin 13.0 - 18.0 g/dL 15.3 14.5  Hematocrit 40.0 - 52.0 % 45.7 43.9  Platelets 150 - 440 K/uL 275 275    Appointment on 10/18/2015  Component Date Value Ref Range Status  . WBC 10/18/2015 10.8* 3.8 - 10.6 K/uL Final  . RBC 10/18/2015 4.97  4.40 - 5.90 MIL/uL Final  . Hemoglobin 10/18/2015 15.3  13.0 - 18.0 g/dL Final  . HCT 10/18/2015 45.7  40.0 - 52.0 % Final  . MCV 10/18/2015 91.8  80.0 - 100.0 fL Final  . MCH 10/18/2015 30.8  26.0 - 34.0 pg Final  . MCHC 10/18/2015 33.5  32.0 - 36.0 g/dL Final  . RDW 10/18/2015 14.0  11.5 - 14.5 % Final  . Platelets 10/18/2015 275  150 - 440 K/uL Final  . Neutrophils Relative % 10/18/2015 69   Final  . Neutro Abs 10/18/2015 7.5* 1.4 - 6.5 K/uL Final  . Lymphocytes Relative 10/18/2015 18   Final  . Lymphs Abs 10/18/2015 1.9  1.0 - 3.6 K/uL Final  . Monocytes Relative 10/18/2015 10   Final  . Monocytes Absolute 10/18/2015 1.1* 0.2 - 1.0 K/uL Final  . Eosinophils Relative 10/18/2015 2   Final  . Eosinophils Absolute 10/18/2015 0.2  0 - 0.7 K/uL Final  . Basophils Relative 10/18/2015 1   Final  . Basophils Absolute 10/18/2015 0.1  0 - 0.1 K/uL Final  . Sodium 10/18/2015 138  135 - 145 mmol/L Final  . Potassium 10/18/2015 3.6  3.5 - 5.1 mmol/L Final  . Chloride 10/18/2015 102  101 - 111 mmol/L Final  . CO2 10/18/2015 28  22 - 32 mmol/L Final  . Glucose, Bld 10/18/2015 162* 65 - 99 mg/dL Final  . BUN 10/18/2015 12  6 - 20 mg/dL Final  . Creatinine, Ser  10/18/2015 0.95  0.61 - 1.24 mg/dL Final  . Calcium 10/18/2015 8.6* 8.9 - 10.3 mg/dL Final  . GFR calc non Af Amer 10/18/2015 >60  >60 mL/min Final  . GFR calc Af Amer 10/18/2015 >60  >60 mL/min Final   Comment: (NOTE) The eGFR has been calculated using the CKD EPI equation. This calculation has not been validated in all clinical situations. eGFR's persistently <60 mL/min signify possible Chronic Kidney Disease.   . Anion gap 10/18/2015 8  5 - 15 Final         ASSESSMENT:  Autoimmune hemolytic anemia.  Patient is off all prednisone therapy and Rituxan therapy Hemoglobin is stable at 15.3 Mild lymphocytosis states to be followed Patient desires flu vaccine which was given Assessment and Plan: Impression:   1. Coombs positive hemolytic anemia.  2. B- Cell lymphoma.   3. Coombs positive hemolytic anemia. He is status post rituximab/prednisone therapy. Hgb is 14.8 and stable today.  4 B Cell Lymphoma. Clinically there is no evidence of disease. Will continue with observation.  Hemoglobin is stable. Peripheral neuropathy and foot drop is secondary to diabetes Continue follow-up without any intervention     Patient expressed understanding and was in agreement with this plan. He also understands that He can call clinic at any time with any questions, concerns, or complaints.    No matching staging information was found for the patient.  Forest Gleason, MD   10/18/2015 11:48 AM

## 2015-11-18 ENCOUNTER — Emergency Department (HOSPITAL_COMMUNITY)
Admission: EM | Admit: 2015-11-18 | Discharge: 2015-11-19 | Disposition: A | Discharge status: Home / Self Care | Payer: Medicare Other | Attending: Emergency Medicine | Admitting: Emergency Medicine

## 2015-11-18 ENCOUNTER — Encounter (HOSPITAL_COMMUNITY): Payer: Self-pay | Admitting: Nurse Practitioner

## 2015-11-18 ENCOUNTER — Emergency Department (HOSPITAL_COMMUNITY): Payer: Medicare Other

## 2015-11-18 DIAGNOSIS — Z8669 Personal history of other diseases of the nervous system and sense organs: Secondary | ICD-10-CM | POA: Diagnosis not present

## 2015-11-18 DIAGNOSIS — S0101XA Laceration without foreign body of scalp, initial encounter: Secondary | ICD-10-CM | POA: Diagnosis not present

## 2015-11-18 DIAGNOSIS — I10 Essential (primary) hypertension: Secondary | ICD-10-CM | POA: Insufficient documentation

## 2015-11-18 DIAGNOSIS — Y9389 Activity, other specified: Secondary | ICD-10-CM | POA: Insufficient documentation

## 2015-11-18 DIAGNOSIS — Z8572 Personal history of non-Hodgkin lymphomas: Secondary | ICD-10-CM | POA: Diagnosis not present

## 2015-11-18 DIAGNOSIS — Y92009 Unspecified place in unspecified non-institutional (private) residence as the place of occurrence of the external cause: Secondary | ICD-10-CM | POA: Diagnosis not present

## 2015-11-18 DIAGNOSIS — Y998 Other external cause status: Secondary | ICD-10-CM | POA: Diagnosis not present

## 2015-11-18 DIAGNOSIS — W01198A Fall on same level from slipping, tripping and stumbling with subsequent striking against other object, initial encounter: Secondary | ICD-10-CM | POA: Diagnosis not present

## 2015-11-18 DIAGNOSIS — W010XXA Fall on same level from slipping, tripping and stumbling without subsequent striking against object, initial encounter: Secondary | ICD-10-CM

## 2015-11-18 DIAGNOSIS — Z79899 Other long term (current) drug therapy: Secondary | ICD-10-CM | POA: Insufficient documentation

## 2015-11-18 DIAGNOSIS — S0990XA Unspecified injury of head, initial encounter: Secondary | ICD-10-CM | POA: Diagnosis present

## 2015-11-18 HISTORY — DX: Essential (primary) hypertension: I10

## 2015-11-18 HISTORY — DX: Non-Hodgkin lymphoma, unspecified, unspecified site: C85.90

## 2015-11-18 MED ORDER — LIDOCAINE-EPINEPHRINE (PF) 2 %-1:200000 IJ SOLN
INTRAMUSCULAR | Status: AC
  Filled 2015-11-18: qty 20

## 2015-11-18 MED ORDER — LIDOCAINE-EPINEPHRINE (PF) 2 %-1:200000 IJ SOLN
10.0000 mL | Freq: Once | INTRAMUSCULAR | Status: AC
  Administered 2015-11-18: 10 mL

## 2015-11-18 NOTE — ED Notes (Signed)
Bed: [id number] Expected date:  Expected time:  Means of arrival:  Comments: EMS 81 yo M fall; head lac

## 2015-11-18 NOTE — ED Provider Notes (Signed)
CSN: [id number]     Arrival date & time 11/18/15  2241 History  By signing my name below, I, [name], attest that this documentation has been prepared under the direction and in the presence of Delora Fuel, MD. Electronically Signed: [name], Scribe. 11/18/2015. 11:25 PM.    Chief Complaint  Patient presents with  . Fall  . Head Injury    Patient is a 81 y.o. male presenting with head injury. The history is provided by the patient. No language interpreter was used.  Head Injury Associated symptoms: no neck pain     HPI Comments: Zachary Spence is a 81 y.o. male who presents to the Emergency Department complaining of a mechanical fall due to a loss of balance while trying to reach up and turn off the lamp in his room before going to bed earlier this evening. He reports he landed on his tail bone before hitting the occipital area of his head. Pt's son reports that his father lost a lot of blood after the fall but the bleeding has now resolved. Pt reports he has had falls due to a loss of balance before. 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 He denies LOC, back pain and neck pain. Pt suspects his last tetanus shot was within the last five years but is unsure.   PCP: Dr. Starr Sinclair  Past Medical History  Diagnosis Date  . Hypertension   . Non Hodgkin's lymphoma [facility])    Past Surgical History  Procedure Laterality Date  . Appendectomy    . Splenectomy    . Coronary artery bypass graft     History reviewed. No pertinent family history. Social History  Substance Use Topics  . Smoking status: Never Smoker   . Smokeless tobacco: Never Used  . Alcohol Use: No    Review of Systems  Musculoskeletal: Negative for back pain and neck pain.  Skin: Positive for wound ( gash to the left parietal lobe).  Neurological: Negative for syncope.  All other systems reviewed and are negative.     Allergies  Erythromycin  Home Medications   Prior to  Admission medications   Medication Sig Start Date End Date Taking? Authorizing Provider  atenolol (TENORMIN) 25 MG tablet TAKE 1 TABLET (25 MG) EVERY DAY 08/23/15  Yes Historical Provider, MD  atorvastatin (LIPITOR) 10 MG tablet Take by mouth. 08/23/15 08/22/16 Yes Historical Provider, MD  calcium carbonate (OS-CAL - DOSED IN MG OF ELEMENTAL CALCIUM) 1250 (500 CA) MG tablet Take 1 tablet by mouth daily.   Yes Historical Provider, MD  folic acid (FOLVITE) 1 MG tablet Take 1 mg by mouth daily.   Yes Historical Provider, MD  glipiZIDE (GLUCOTROL) 5 MG tablet TAKE ONE TABLET DAILY WITH FOOD FOR DIABETES 08/23/15  Yes Historical Provider, MD  Multiple Vitamins-Minerals (MULTIVITAMIN & MINERAL PO) Take 1 tablet by mouth daily.    Yes Historical Provider, MD  Multiple Vitamins-Minerals (PRESERVISION AREDS 2 PO) Take 1 capsule by mouth daily.    Yes Historical Provider, MD  Omega-3 Fatty Acids (FISH OIL) 1000 MG CAPS Take 1,000 mg by mouth daily.    Yes Historical Provider, MD   BP 136/74 mmHg  Pulse 75  Temp(Src) 98.2 F (36.8 C) (Oral)  Resp 18  SpO2 98% Physical Exam  Constitutional: He is oriented to person, place, and time. He appears well-developed and well-nourished. No distress.  HENT:  Head: Normocephalic.  Laceration left parietal area No active bleeding  Eyes: Conjunctivae and EOM are normal. Pupils are equal, round, and reactive to light.  Neck: No JVD present.  Nontender  Cardiovascular: Normal rate, regular rhythm and normal heart sounds.   No murmur heard. Pulmonary/Chest: Effort normal and breath sounds normal. He has no wheezes. He has no rales. He exhibits no tenderness.  Abdominal: Soft. Bowel sounds are normal. He exhibits no distension and no mass. There is no tenderness.  Musculoskeletal: Normal range of motion. He exhibits no edema.  Lymphadenopathy:    He has no cervical adenopathy.  Neurological: He is alert and oriented to person, place, and time. No cranial nerve deficit.  He exhibits normal muscle tone. Coordination normal.  Skin: Skin is warm and dry. No rash noted.  Psychiatric: He has a normal mood and affect. His behavior is normal. Judgment and thought content normal.  Nursing note and vitals reviewed.   ED Course  Procedures  DIAGNOSTIC STUDIES:    Oxygen Saturation is 98% on RA, normal by my interpretation.   COORDINATION OF CARE:  11:20 PM Will send pt for CT scan. Will suture pt's head laceration. Discussed treatment plan with pt at bedside and pt agreed to plan.   LACERATION REPAIR Performed by: [id number] Authorized by: [id number] Consent: Verbal consent obtained. Risks and benefits: risks, benefits and alternatives were discussed Consent given by: patient Patient identity confirmed: provided demographic data Prepped and Draped in normal sterile fashion Wound explored  Laceration Location: Scalp  Laceration Length: 8.0 cm  No Foreign Bodies seen or palpated  Anesthesia: local infiltration  Local anesthetic: lidocaine 2% with epinephrine  Anesthetic total: 3 ml  Amount of cleaning: standard  Skin closure: Close   Number of staples: 13  Technique: Surgical stapling   Patient tolerance: Patient tolerated the procedure well with no immediate complications.    Imaging Review Ct Head Wo Contrast  11/19/2015  CLINICAL DATA:  Loss of balance while reaching. Fell backward and hit back of head. Concern for head or cervical spine injury. Initial encounter. EXAM: CT HEAD WITHOUT CONTRAST CT CERVICAL SPINE WITHOUT CONTRAST TECHNIQUE: Multidetector CT imaging of the head and cervical spine was performed following the standard protocol without intravenous contrast. Multiplanar CT image reconstructions of the cervical spine were also generated. COMPARISON:  None. FINDINGS: CT HEAD FINDINGS There is no evidence of acute infarction, mass lesion, or intra- or extra-axial hemorrhage on CT. Prominence of the ventricles and sulci reflects  mild cortical volume loss. Mild periventricular white matter change likely reflects small vessel ischemic microangiopathy. A small chronic lacunar infarct is noted at the left mid corona radiata. The brainstem and fourth ventricle are within normal limits. The basal ganglia are unremarkable in appearance. The cerebral hemispheres demonstrate grossly normal gray-white differentiation. No mass effect or midline shift is seen. There is no evidence of fracture; visualized osseous structures are unremarkable in appearance. The orbits are within normal limits. The paranasal sinuses and mastoid air cells are well-aerated. A 1.6 cm soft tissue nodule is noted anterior to the right parotid gland, of uncertain significance. CT CERVICAL SPINE FINDINGS There is no evidence of fracture or subluxation. Vertebral bodies demonstrate normal height and alignment. Intervertebral mild multilevel disc space narrowing is noted along the cervical and upper thoracic spine, with scattered anterior and posterior disc osteophyte complexes. There is mild chronic anterior compression deformity at T3. Diffuse facet disease is noted along the cervical spine. Prevertebral soft tissues are within normal limits. The thyroid gland is unremarkable in appearance. Mild scarring is noted  at the lung apices. No significant soft tissue abnormalities are seen. IMPRESSION: 1. No evidence of traumatic intracranial injury or fracture. 2. No evidence of fracture or subluxation along the cervical spine. 3. Mild cortical volume loss and scattered small vessel ischemic microangiopathy. 4. Small chronic lacunar infarct at the left mid corona radiata. 5. Mild diffuse degenerative change along the cervical spine, with mild chronic anterior compression deformity at T3. 6. Mild scarring noted at the lung apices. 7. 1.6 cm soft tissue nodule anterior to the right parotid gland, of uncertain significance. Would correlate for any recent palpable findings. Electronically  Signed   By: Garald Balding M.D.   On: 11/19/2015 00:32   Ct Cervical Spine Wo Contrast  11/19/2015  CLINICAL DATA:  Loss of balance while reaching. Fell backward and hit back of head. Concern for head or cervical spine injury. Initial encounter. EXAM: CT HEAD WITHOUT CONTRAST CT CERVICAL SPINE WITHOUT CONTRAST TECHNIQUE: Multidetector CT imaging of the head and cervical spine was performed following the standard protocol without intravenous contrast. Multiplanar CT image reconstructions of the cervical spine were also generated. COMPARISON:  None. FINDINGS: CT HEAD FINDINGS There is no evidence of acute infarction, mass lesion, or intra- or extra-axial hemorrhage on CT. Prominence of the ventricles and sulci reflects mild cortical volume loss. Mild periventricular white matter change likely reflects small vessel ischemic microangiopathy. A small chronic lacunar infarct is noted at the left mid corona radiata. The brainstem and fourth ventricle are within normal limits. The basal ganglia are unremarkable in appearance. The cerebral hemispheres demonstrate grossly normal gray-white differentiation. No mass effect or midline shift is seen. There is no evidence of fracture; visualized osseous structures are unremarkable in appearance. The orbits are within normal limits. The paranasal sinuses and mastoid air cells are well-aerated. A 1.6 cm soft tissue nodule is noted anterior to the right parotid gland, of uncertain significance. CT CERVICAL SPINE FINDINGS There is no evidence of fracture or subluxation. Vertebral bodies demonstrate normal height and alignment. Intervertebral mild multilevel disc space narrowing is noted along the cervical and upper thoracic spine, with scattered anterior and posterior disc osteophyte complexes. There is mild chronic anterior compression deformity at T3. Diffuse facet disease is noted along the cervical spine. Prevertebral soft tissues are within normal limits. The thyroid gland  is unremarkable in appearance. Mild scarring is noted at the lung apices. No significant soft tissue abnormalities are seen. IMPRESSION: 1. No evidence of traumatic intracranial injury or fracture. 2. No evidence of fracture or subluxation along the cervical spine. 3. Mild cortical volume loss and scattered small vessel ischemic microangiopathy. 4. Small chronic lacunar infarct at the left mid corona radiata. 5. Mild diffuse degenerative change along the cervical spine, with mild chronic anterior compression deformity at T3. 6. Mild scarring noted at the lung apices. 7. 1.6 cm soft tissue nodule anterior to the right parotid gland, of uncertain significance. Would correlate for any recent palpable findings. Electronically Signed   By: Garald Balding M.D.   On: 11/19/2015 00:32   I have personally reviewed and evaluated these images and lab results as part of my medical decision-making.   MDM   Final diagnoses:  Fall from slip, trip, or stumble, initial encounter  Scalp laceration, initial encounter     fall with scalp laceration. He is sent for CT to rule out intracranial injury. Laceration is closed with stapling.   I personally performed the services described in this documentation, which was scribed in my presence. The  recorded information has been reviewed and is accurate.        Delora Fuel, MD [id number]

## 2015-11-18 NOTE — ED Notes (Signed)
Pt presents from home, reports a fall landing on his tail bone before hitting his head on the occipital aspect, bleeding is controlled at this time from the laceration, denies LOC or being on anticoagulants.

## 2015-11-19 NOTE — Discharge Instructions (Signed)
Staples need to be removed in 7 days. I can be done here, at an urgent care center, or at her doctor's office.  Fall Prevention in the Home  Falls can cause injuries and can affect people from all age groups. There are many simple things that you can do to make your home safe and to help prevent falls. WHAT CAN I DO ON THE OUTSIDE OF MY HOME?  Regularly repair the edges of walkways and driveways and fix any cracks.  Remove high doorway thresholds.  Trim any shrubbery on the main path into your home.  Use bright outdoor lighting.  Clear walkways of debris and clutter, including tools and rocks.  Regularly check that handrails are securely fastened and in good repair. Both sides of any steps should have handrails.  Install guardrails along the edges of any raised decks or porches.  Have leaves, snow, and ice cleared regularly.  Use sand or salt on walkways during winter months.  In the garage, clean up any spills right away, including grease or oil spills. WHAT CAN I DO IN THE BATHROOM?  Use night lights.  Install grab bars by the toilet and in the tub and shower. Do not use towel bars as grab bars.  Use non-skid mats or decals on the floor of the tub or shower.  If you need to sit down while you are in the shower, use a plastic, non-slip stool.Marland Kitchen  Keep the floor dry. Immediately clean up any water that spills on the floor.  Remove soap buildup in the tub or shower on a regular basis.  Attach bath mats securely with double-sided non-slip rug tape.  Remove throw rugs and other tripping hazards from the floor. WHAT CAN I DO IN THE BEDROOM?  Use night lights.  Make sure that a bedside light is easy to reach.  Do not use oversized bedding that drapes onto the floor.  Have a firm chair that has side arms to use for getting dressed.  Remove throw rugs and other tripping hazards from the floor. WHAT CAN I DO IN THE KITCHEN?   Clean up any spills right away.  Avoid  walking on wet floors.  Place frequently used items in easy-to-reach places.  If you need to reach for something above you, use a sturdy step stool that has a grab bar.  Keep electrical cables out of the way.  Do not use floor polish or wax that makes floors slippery. If you have to use wax, make sure that it is non-skid floor wax.  Remove throw rugs and other tripping hazards from the floor. WHAT CAN I DO IN THE STAIRWAYS?  Do not leave any items on the stairs.  Make sure that there are handrails on both sides of the stairs. Fix handrails that are broken or loose. Make sure that handrails are as long as the stairways.  Check any carpeting to make sure that it is firmly attached to the stairs. Fix any carpet that is loose or worn.  Avoid having throw rugs at the top or bottom of stairways, or secure the rugs with carpet tape to prevent them from moving.  Make sure that you have a light switch at the top of the stairs and the bottom of the stairs. If you do not have them, have them installed. WHAT ARE SOME OTHER FALL PREVENTION TIPS?  Wear closed-toe shoes that fit well and support your feet. Wear shoes that have rubber soles or low heels.  When you  use a stepladder, make sure that it is completely opened and that the sides are firmly locked. Have someone hold the ladder while you are using it. Do not climb a closed stepladder.  Add color or contrast paint or tape to grab bars and handrails in your home. Place contrasting color strips on the first and last steps.  Use mobility aids as needed, such as canes, walkers, scooters, and crutches.  Turn on lights if it is dark. Replace any light bulbs that burn out.  Set up furniture so that there are clear paths. Keep the furniture in the same spot.  Fix any uneven floor surfaces.  Choose a carpet design that does not hide the edge of steps of a stairway.  Be aware of any and all pets.  Review your medicines with your healthcare  provider. Some medicines can cause dizziness or changes in blood pressure, which increase your risk of falling. Talk with your health care provider about other ways that you can decrease your risk of falls. This may include working with a physical therapist or trainer to improve your strength, balance, and endurance.   This information is not intended to replace advice given to you by your health care provider. Make sure you discuss any questions you have with your health care provider.   Document Released: 11/28/2002 Document Revised: 04/24/2015 Document Reviewed: 01/12/2015 Elsevier Interactive Patient Education 2016 [place], Adult A laceration is a cut that goes through all of the layers of the skin and into the tissue that is right under the skin. Some lacerations heal on their own. Others need to be closed with stitches (sutures), staples, skin adhesive strips, or skin glue. Proper laceration care minimizes the risk of infection and helps the laceration to heal better. HOW TO CARE FOR YOUR LACERATION If sutures or staples were used:  Keep the wound clean and dry.  If you were given a bandage (dressing), you should change it at least one time per day or as told by your health care provider. You should also change it if it becomes wet or dirty.  Keep the wound completely dry for the first 24 hours or as told by your health care provider. After that time, you may shower or bathe. However, make sure that the wound is not soaked in water until after the sutures or staples have been removed.  Clean the wound one time each day or as told by your health care provider:  Wash the wound with soap and water.  Rinse the wound with water to remove all soap.  Pat the wound dry with a clean towel. Do not rub the wound.  After cleaning the wound, apply a thin layer of antibiotic ointmentas told by your health care provider. This will help to prevent infection and keep the  dressing from sticking to the wound.  Have the sutures or staples removed as told by your health care provider. If skin adhesive strips were used:  Keep the wound clean and dry.  If you were given a bandage (dressing), you should change it at least one time per day or as told by your health care provider. You should also change it if it becomes dirty or wet.  Do not get the skin adhesive strips wet. You may shower or bathe, but be careful to keep the wound dry.  If the wound gets wet, pat it dry with a clean towel. Do not rub the wound.  Skin adhesive  strips fall off on their own. You may trim the strips as the wound heals. Do not remove skin adhesive strips that are still stuck to the wound. They will fall off in time. If skin glue was used:  Try to keep the wound dry, but you may briefly wet it in the shower or bath. Do not soak the wound in water, such as by swimming.  After you have showered or bathed, gently pat the wound dry with a clean towel. Do not rub the wound.  Do not do any activities that will make you sweat heavily until the skin glue has fallen off on its own.  Do not apply liquid, cream, or ointment medicine to the wound while the skin glue is in place. Using those may loosen the film before the wound has healed.  If you were given a bandage (dressing), you should change it at least one time per day or as told by your health care provider. You should also change it if it becomes dirty or wet.  If a dressing is placed over the wound, be careful not to apply tape directly over the skin glue. Doing that may cause the glue to be pulled off before the wound has healed.  Do not pick at the glue. The skin glue usually remains in place for 5-10 days, then it falls off of the skin. General Instructions  Take over-the-counter and prescription medicines only as told by your health care provider.  If you were prescribed an antibiotic medicine or ointment, take or apply it as  told by your doctor. Do not stop using it even if your condition improves.  To help prevent scarring, make sure to cover your wound with sunscreen whenever you are outside after stitches are removed, after adhesive strips are removed, or when glue remains in place and the wound is healed. Make sure to wear a sunscreen of at least 30 SPF.  Do not scratch or pick at the wound.  Keep all follow-up visits as told by your health care provider. This is important.  Check your wound every day for signs of infection. Watch for:  Redness, swelling, or pain.  Fluid, blood, or pus.  Raise (elevate) the injured area above the level of your heart while you are sitting or lying down, if possible. SEEK MEDICAL CARE IF:  You received a tetanus shot and you have swelling, severe pain, redness, or bleeding at the injection site.  You have a fever.  A wound that was closed breaks open.  You notice a bad smell coming from your wound or your dressing.  You notice something coming out of the wound, such as wood or glass.  Your pain is not controlled with medicine.  You have increased redness, swelling, or pain at the site of your wound.  You have fluid, blood, or pus coming from your wound.  You notice a change in the color of your skin near your wound.  You need to change the dressing frequently due to fluid, blood, or pus draining from the wound.  You develop a new rash.  You develop numbness around the wound. SEEK IMMEDIATE MEDICAL CARE IF:  You develop severe swelling around the wound.  Your pain suddenly increases and is severe.  You develop painful lumps near the wound or on skin that is anywhere on your body.  You have a red streak going away from your wound.  The wound is on your hand or foot and you cannot  properly move a finger or toe.  The wound is on your hand or foot and you notice that your fingers or toes look pale or bluish.   This information is not intended to replace  advice given to you by your health care provider. Make sure you discuss any questions you have with your health care provider.   Document Released: 12/08/2005 Document Revised: 04/24/2015 Document Reviewed: 12/04/2014 Elsevier Interactive Patient Education [employer].

## 2015-11-26 ENCOUNTER — Emergency Department (HOSPITAL_COMMUNITY)
Admission: EM | Admit: 2015-11-26 | Discharge: 2015-11-26 | Disposition: A | Discharge status: Home / Self Care | Payer: Medicare Other | Attending: Emergency Medicine | Admitting: Emergency Medicine

## 2015-11-26 ENCOUNTER — Encounter (HOSPITAL_COMMUNITY): Payer: Self-pay | Admitting: Emergency Medicine

## 2015-11-26 DIAGNOSIS — Z951 Presence of aortocoronary bypass graft: Secondary | ICD-10-CM | POA: Insufficient documentation

## 2015-11-26 DIAGNOSIS — Z8572 Personal history of non-Hodgkin lymphomas: Secondary | ICD-10-CM | POA: Insufficient documentation

## 2015-11-26 DIAGNOSIS — Z79899 Other long term (current) drug therapy: Secondary | ICD-10-CM | POA: Diagnosis not present

## 2015-11-26 DIAGNOSIS — I10 Essential (primary) hypertension: Secondary | ICD-10-CM | POA: Insufficient documentation

## 2015-11-26 DIAGNOSIS — Z4802 Encounter for removal of sutures: Secondary | ICD-10-CM | POA: Diagnosis present

## 2015-11-26 NOTE — Discharge Instructions (Signed)
Please follow up with a primary care provider from the Resource Guide provided below. Return to the emergency department if symptoms worsen or new onset of fever, drainage, redness, swelling.   Emergency Department Resource Guide 1) Find a Doctor and Pay Out of Pocket Although you won't have to find out who is covered by your insurance plan, it is a good idea to ask around and get recommendations. You will then need to call the office and see if the doctor you have chosen will accept you as a new patient and what types of options they offer for patients who are self-pay. Some doctors offer discounts or will set up payment plans for their patients who do not have insurance, but you will need to ask so you aren't surprised when you get to your appointment.  2) Contact Your Local Health Department Not all health departments have doctors that can see patients for sick visits, but many do, so it is worth a call to see if yours does. If you don't know where your local health department is, you can check in your phone book. The CDC also has a tool to help you locate your state's health department, and many state websites also have listings of all of their local health departments.  3) Find a [facility] If your illness is not likely to be very severe or complicated, you may want to try a walk in clinic. These are popping up all over the country in pharmacies, drugstores, and shopping centers. They're usually staffed by nurse practitioners or physician assistants that have been trained to treat common illnesses and complaints. They're usually fairly quick and inexpensive. However, if you have serious medical issues or chronic medical problems, these are probably not your best option.  No Primary Care Doctor: - Call Health Connect at  [phone] - they can help you locate a primary care doctor that  accepts your insurance, provides certain services, etc. - Physician Referral Service-  [phone]  Chronic Pain Problems: Organization         Address  Phone   Notes  [facility]  [phone] Patients need to be referred by their primary care doctor.   Medication Assistance: Organization         Address  Phone   Notes  [facility] Medication [facility] [address] [place], [place] [address] [phone] --Must be a resident of [facility] -- Must have NO insurance coverage whatsoever (no Medicaid/ Medicare, etc.) -- The pt. MUST have a primary care doctor that directs their care regularly and follows them in the community   MedAssist  [phone]   [employer]  [phone]    Agencies that provide inexpensive medical care: Organization         Address  Phone   Notes  [place]  [phone]   [facility]    [phone]   [facility] [place], [place] [address] [phone]   [place] [address] [place] [phone]   Planned Parenthood    [phone]   [facility]    [phone]   [place] and [address], [place] Phone:  [phone], Fax:  [phone] Hours of Operation:  9 am - 6 pm, M-F.  Also accepts Medicaid/Medicare and self-pay.  [facility] for [place]  [address], [place] Phone: [phone], Fax: [phone]. Hours of Operation:  8:30 am - 5:30 pm, M-F.  Also accepts Medicaid and self-pay.  [facility] [address] [place] Phone: [phone]   [place], [place], [place] [phone], Ext. [phone] Mondays & Thursdays: 7-9 AM.  First 15 patients are seen on a first come, first serve basis.    [place] Providers:  Organization         Address  Phone   Notes  [facility] [address],  [place] [phone] Also accepts self-pay patients.  [facility] [address], [place]  [phone]   [place], [address], [place] [phone]   [facility] [address] [place] [phone]   Lucianne Lei [address], [place]   [phone] Only accepts [place] Access [place] patients after they have their name applied to their card.   Self-Pay (no insurance) in [facility]:  Organization         Address  Phone   Notes  Sickle Cell Patients, [facility] [place] [phone]   [facility] Urgent Care [place] [phone]   [facility] [place]  [place], [place], [place] [phone]   [facility]/Dr. Osei-Bonsu  [address] [place] or [address], [place] [phone] Phone number for both [place] and [place] locations is the same.  Urgent Medical and [facility] [address], [place] [phone]   [facility] [address] [place] or [address] [phone]   [facility] [address] [place] [phone], phone; [phone], fax Sees patients 1st and 3rd Saturday of every month.  Must not qualify for public or private insurance (i.e. Medicaid, Medicare, [place] Health Choice, Veterans' Benefits)  Household income should be no more than 200% of the poverty level The clinic cannot treat you if you are pregnant or think you are pregnant  Sexually transmitted diseases are not treated at the clinic.    Dental Care: Organization         Address  Phone  Notes  [facility] Department of [facility] [phone] Accepts children up to age 11 who are enrolled in  [place] or [place]; pregnant women with a Medicaid card; and children who have applied for Medicaid or [place] Health Choice, but were declined, whose parents can pay a reduced fee at time of service.  [facility] Department of [facility]  [address], [place] [phone] Accepts children up to age 74 who are enrolled in [place] or [place]; pregnant women with a Medicaid card; and children who have applied for Medicaid or [place] Health Choice, but were declined, whose parents can pay a reduced fee at time of service.  [facility] PRO[facility]  [place] [phone] Patients are seen by appointment only. Walk-ins are not accepted. [place] will see patients 38 years of age and older. Monday - Tuesday (8am-5pm) Most Wednesdays (8:30-5pm) $30 per visit, cash only  [place]  PROGRAM  [address], [facility] [phone] Patients are seen by appointment only. Walk-ins are not accepted. [place] will see patients 72 years of age and older. One Wednesday Evening (Monthly: Volunteer Based).  $30 per visit, cash only  [place]  [phone] for adults; Children under age 63, call Graduate Pediatric Dentistry at [phone]. Children aged 75-14, please call [phone] to request a pediatric application.  Dental services are provided in all areas of dental care including fillings, crowns and bridges, complete and partial dentures, implants, gum treatment, root canals, and extractions. Preventive care is also provided. Treatment is provided to both adults and children. Patients are selected via a lottery and there is often a waiting list.   [facility] [address] [place]  [phone] [email or web address]   [facility] [address], [place] [phone], Ext. [phone] Second and Fourth Thursday of each month, opens at 6:30  AM; Clinic ends at 9 AM.  Patients are seen on a first-come first-served basis, and a limited number are seen during each clinic.   [facility]  [address] Hillard Danker [place], [place] [phone]   Eligibility Requirements You must have lived in [place], [place], or [place] counties for at least the last three months.   You cannot be eligible for state or federal sponsored [employer], including [employer], [place], or [employer].   You generally cannot be eligible for healthcare insurance through your employer.    How to apply: Eligibility screenings are held every Tuesday and Wednesday afternoon from 1:00 pm until 4:00 pm. You do not need an appointment for the interview!  [facility] [address] [place], [place]   [place]  [facility] Department  [place]  [phone]    Behavioral Health Resources in the Community: Intensive Outpatient Programs Organization         Address  Phone  Notes  [place]et Warren. [address], [place], [place] [phone]   [facility] Outpatient [address], [place], [place]   ADS: Alcohol & Drug S[facility] [address] [place], [place]   [place] [address]  [place], [place] or [phone]   Substance Abuse Resources Organization         Address  Phone  Notes  Alcohol and Drug Services  [phone]   [place]  [phone]   [facility] [place]   Chinita Pester  [phone]   Residential & Outpatient Substance Abuse Program  [phone]   Psychological Services Organization         Address  Phone  Notes  [facility] [place]  [place]  [phone]   [place] [address], [place] or  [phone]    Mobile Crisis Teams Organization         Address  Phone  Notes  [facility], Mobile Crisis Care Unit  [phone]   [facility]ive Psychotherapeutic Services  [address] [place], [place]   [place] [address], [place] [phone]    Self-Help/Support Groups Organization         Address  Phone             Notes  [place].  of [employer] - variety of support groups  [place] Call for more information  Narcotics Anonymous (NA), [facility] [address], [employer] [place]  2 meetings at this location   Special educational needs teacher         Address  Phone  Notes  [facility] Residential Treatment [place],    [place]  [phone]   [facility]  [address], [place] [phone], [place], [place]   [address] [place], [place] [phone] Admissions: 8am-3pm M-F  Incentives Substance [place] [address]    [place], [place] [phone]   [facility] [address] [place], [place], [place]   [facility] [address],  [place], [place]   [facility] - Intensive Outpatient [address] Krist[address], [place], [place]   [facility] [address].,  [place], [place] [phone] or [phone]   [facility] Services ([facility]) [address], [place], [place] Accepts Medicaid  [facility] [place] [address]  [place] [phone] Substance Abuse/Addiction Treatment   [facility] Organization         Address  Phone  Notes  [facility]  [phone]   Domenic Schwab, PhD [address] Arlis Porta [place], [place]   [phone] or [phone]   [place] Park[place], [place] [phone]   [facility] [address],  [place], [place] [phone] Insurance/Medicaid/sponsorship through [facility] and Families [address]                                    [place], [place] [phone] [place] [address] Street[place], [place] [phone]    Dr. Adele Schilder  [phone]   [facility] Dept. 1) [address] [place] 2) [place] 3)  [place] [address], [place] [phone]  [phone]   [place] [phone] or [phone] (After Hours)

## 2015-11-26 NOTE — ED Provider Notes (Signed)
CSN: [id number]     Arrival date & time 11/26/15  1225 History  By signing my name below, I, Evelene Croon, attest that this documentation has been prepared under the direction and in the presence of non-physician practitioner, Harlene Ramus, PA-C. Electronically Signed: Evelene Croon, Scribe. 11/26/2015. 12:55 PM.  Chief Complaint  Patient presents with  . Suture / Staple Removal    The history is provided by the patient. No language interpreter was used.    HPI Comments:  Zachary Spence is a 81 y.o. male who presents to the Emergency Department for staple removal. He was seen in the ED on 11/18/15 following a fall and  had 13 staples placed in his left parietal scalp at that time, negative head CT. Pt notes he struck his head on the door knob when he went to turn the light off. He denies fever, HA, change in vision, difficulty walking, and drainage/bleeding from the site. Pt has no other complaints or symptoms at this time.    Past Medical History  Diagnosis Date  . Hypertension   . Non Hodgkin's lymphoma [facility])    Past Surgical History  Procedure Laterality Date  . Appendectomy    . Splenectomy    . Coronary artery bypass graft     No family history on file. Social History  Substance Use Topics  . Smoking status: Never Smoker   . Smokeless tobacco: Never Used  . Alcohol Use: No    Review of Systems  Constitutional: Negative for fever and chills.  Eyes: Negative for visual disturbance.  Respiratory: Negative for shortness of breath.   Cardiovascular: Negative for chest pain.  Skin: Positive for wound (Staple removal).  Neurological: Negative for headaches.    Allergies  Erythromycin  Home Medications   Prior to Admission medications   Medication Sig Start Date End Date Taking? Authorizing Provider  atenolol (TENORMIN) 25 MG tablet TAKE 1 TABLET (25 MG) EVERY DAY 08/23/15   Historical Provider, MD  atorvastatin (LIPITOR) 10 MG tablet Take by mouth. 08/23/15 08/22/16   Historical Provider, MD  calcium carbonate (OS-CAL - DOSED IN MG OF ELEMENTAL CALCIUM) 1250 (500 CA) MG tablet Take 1 tablet by mouth daily.    Historical Provider, MD  folic acid (FOLVITE) 1 MG tablet Take 1 mg by mouth daily.    Historical Provider, MD  glipiZIDE (GLUCOTROL) 5 MG tablet TAKE ONE TABLET DAILY WITH FOOD FOR DIABETES 08/23/15   Historical Provider, MD  Multiple Vitamins-Minerals (MULTIVITAMIN & MINERAL PO) Take 1 tablet by mouth daily.     Historical Provider, MD  Multiple Vitamins-Minerals (PRESERVISION AREDS 2 PO) Take 1 capsule by mouth daily.     Historical Provider, MD  Omega-3 Fatty Acids (FISH OIL) 1000 MG CAPS Take 1,000 mg by mouth daily.     Historical Provider, MD   BP 112/81 mmHg  Pulse 62  Temp(Src) 98.7 F (37.1 C) (Oral)  Resp 18  SpO2 100% Physical Exam  Constitutional: He is oriented to person, place, and time. He appears well-developed and well-nourished. No distress.  HENT:  Head: Normocephalic.  Eyes: Conjunctivae are normal.  Cardiovascular: Normal rate.   Pulmonary/Chest: Effort normal.  Abdominal: He exhibits no distension.  Neurological: He is alert and oriented to person, place, and time.  Skin: Skin is warm and dry.  Healing laceration to left parietal region with 13 staples in place  No surrounding erythema, swelling, or drainage   Psychiatric: He has a normal mood and affect.  Nursing  note and vitals reviewed.   ED Course  Procedures   DIAGNOSTIC STUDIES:  Oxygen Saturation is 100% on RA, normal by my interpretation.    COORDINATION OF CARE:  12:48 PM 13 staples removed from left scalp at bedside. Pt tolerated procedure well. Discussed follow up plan with pt at bedside and pt agreed to plan.  MDM   Final diagnoses:  Removal of staple    Patient presents for staple removal. He was seen in the ED on 11/27 status post fall, negative head CT, 13 staples were placed to left parietal region. Patient denies any complaints or  complications. VSS. Exam revealed healing laceration to left parietal region with 13 staples in place, no surrounding erythema, swelling, drainage or signs of infection. Staples removed without any, locations. Plan to discharge patient home. Patient given resource guide to follow up with primary care provider.  I personally performed the services described in this documentation, which was scribed in my presence. The recorded information has been reviewed and is accurate.    Chesley Noon [place], [place] 11/26/15 1405  Pattricia Boss, MD 11/27/15 [phone]

## 2015-11-26 NOTE — ED Notes (Signed)
Pt was here on 11/27 and was given 13 staples on the top of his head for a lac.  Pt states that he has had no problems with these and has been taking showers normally.  Pt here for staple removal.

## 2016-04-17 ENCOUNTER — Inpatient Hospital Stay: Payer: Medicare Other

## 2016-04-17 ENCOUNTER — Inpatient Hospital Stay: Payer: Medicare Other | Attending: Oncology | Admitting: Oncology

## 2016-04-17 ENCOUNTER — Encounter: Payer: Self-pay | Admitting: Oncology

## 2016-04-17 VITALS — BP 110/61 | HR 88 | Temp 97.2°F | Resp 18 | Wt 184.6 lb

## 2016-04-17 DIAGNOSIS — D591 Autoimmune hemolytic anemia, unspecified: Secondary | ICD-10-CM

## 2016-04-17 DIAGNOSIS — D72820 Lymphocytosis (symptomatic): Secondary | ICD-10-CM

## 2016-04-17 DIAGNOSIS — Z9221 Personal history of antineoplastic chemotherapy: Secondary | ICD-10-CM | POA: Insufficient documentation

## 2016-04-17 DIAGNOSIS — Z79899 Other long term (current) drug therapy: Secondary | ICD-10-CM | POA: Diagnosis not present

## 2016-04-17 DIAGNOSIS — E1142 Type 2 diabetes mellitus with diabetic polyneuropathy: Secondary | ICD-10-CM | POA: Diagnosis not present

## 2016-04-17 DIAGNOSIS — Z87891 Personal history of nicotine dependence: Secondary | ICD-10-CM

## 2016-04-17 DIAGNOSIS — Z9081 Acquired absence of spleen: Secondary | ICD-10-CM | POA: Insufficient documentation

## 2016-04-17 DIAGNOSIS — Z8711 Personal history of peptic ulcer disease: Secondary | ICD-10-CM | POA: Insufficient documentation

## 2016-04-17 DIAGNOSIS — I2510 Atherosclerotic heart disease of native coronary artery without angina pectoris: Secondary | ICD-10-CM | POA: Insufficient documentation

## 2016-04-17 LAB — CBC WITH DIFFERENTIAL/PLATELET
BASOS PCT: 1 %
Basophils Absolute: 0.1 10*3/uL (ref 0–0.1)
EOS ABS: 0.2 10*3/uL (ref 0–0.7)
Eosinophils Relative: 2 %
HEMATOCRIT: 42.6 % (ref 40.0–52.0)
Hemoglobin: 14.3 g/dL (ref 13.0–18.0)
LYMPHS ABS: 2.2 10*3/uL (ref 1.0–3.6)
Lymphocytes Relative: 28 %
MCH: 30.8 pg (ref 26.0–34.0)
MCHC: 33.6 g/dL (ref 32.0–36.0)
MCV: 91.8 fL (ref 80.0–100.0)
MONO ABS: 1 10*3/uL (ref 0.2–1.0)
MONOS PCT: 12 %
NEUTROS ABS: 4.7 10*3/uL (ref 1.4–6.5)
NEUTROS PCT: 57 %
PLATELETS: 266 10*3/uL (ref 150–440)
RBC: 4.64 MIL/uL (ref 4.40–5.90)
RDW: 13.8 % (ref 11.5–14.5)
WBC: 8.2 10*3/uL (ref 3.8–10.6)

## 2016-04-17 LAB — COMPREHENSIVE METABOLIC PANEL
ALBUMIN: 4.3 g/dL (ref 3.5–5.0)
ALT: 25 U/L (ref 17–63)
AST: 32 U/L (ref 15–41)
Alkaline Phosphatase: 66 U/L (ref 38–126)
Anion gap: 10 (ref 5–15)
BILIRUBIN TOTAL: 1.1 mg/dL (ref 0.3–1.2)
BUN: 18 mg/dL (ref 6–20)
CO2: 23 mmol/L (ref 22–32)
CREATININE: 1.04 mg/dL (ref 0.61–1.24)
Calcium: 9.3 mg/dL (ref 8.9–10.3)
Chloride: 106 mmol/L (ref 101–111)
GFR calc Af Amer: >60 mL/min (ref >60)
GFR calc non Af Amer: >60 mL/min (ref >60)
GLUCOSE: 127 mg/dL — AB (ref 65–99)
POTASSIUM: 4 mmol/L (ref 3.5–5.1)
Sodium: 139 mmol/L (ref 135–145)
Total Protein: 7.2 g/dL (ref 6.5–8.1)

## 2016-04-17 NOTE — Progress Notes (Signed)
[place] @ [facility] [phone]  Fax:([phone]) [place]: 10-Nov-1923  MR#: [id number]  OJJ#:[id number]  Patient Care Team: Tracie Harrier, MD as PCP - General (Internal Medicine)  CHIEF COMPLAINT:  Chief Complaint  Patient presents with  . Autoimmune hemolytic anemia  Chief Complaint/Problem List  1.  Autoimmune hemolytic anemia.  2.  Status post splenectomy.  3.  Status post Rituxan therapy.  4.  Progressive anemia in January 2006. Received Rituxan weekly for 4 weeks as well as quick prednisone taper with a complete response.  Previous work up:  Urine immunoelectrophoresis: No M spike. Serum immunoelectrophoresis: IgG 814, IgA 223, IgM 338, Immunofixation IgA, monoclonal protein with kappa light chain specificity.  CT scan of chest, abdomen, pelvis notable for splenomegaly. No lymphadenopathy in the chest, abdomen, or pelvis.  No liver lesions.  Direct Coombs: IgG negative.  C3B, C3D: Positive (pretransfusion sample). Heptoglobin: Under 15.  Reticulocyte count: 89. LDH: 463. Direct Coombs: IgG negative.  C3B, C3D: Positive (post transfusion sample). Cold agglutinin: 1:128. HCV: AB-  Bone Marrow biopsy: Extensive lymphoid replacement of marrow supporting diagnosis of lymphoma, but mixture of T and B cells and variability in B-cells makes classification difficult. Favor interpretation of aggressive B-cell lymphoma, but clinical correlation and LN biopsy would be of value, however, this is a difficult case to interpret.  Bone marrow biopsy (repeat): No evidence of lymphoma.  Spleen: Normal splenic tissue, no evidence of malignanc   INTERVAL HISTORY:   82 year-old gentleman came today further follow-up regarding autoimmune hemolytic anemia and lymphoproliferative disease.. Patient is here for ongoing evaluation and treatment consideration.  Patient has recently moved to [facility] to be  with his son. Feeling stronger.  Still very active.   Appetite has been stable. Patient remains asymptomatic.  He will be 82 years old remains pretty active. Drives himself.  Patient is moved to   [place] Recently patient had a fall but recovered completely.  Did not have any significant injury. Patient is here for further follow-up regarding autoimmune hemolytic anemia most likely associated with lymphocytic disorder like chronic lymphocytic leukemia. REVIEW OF SYSTEMS:   GENERAL:  Feels good.  Active.  No fevers, sweats or weight loss. PERFORMANCE STATUS (ECOG):0 HEENT:  No visual changes, runny nose, sore throat, mouth sores or tenderness. Lungs: No shortness of breath or cough.  No hemoptysis. Cardiac:  No chest pain, palpitations, orthopnea, or PND. GI:  No nausea, vomiting, diarrhea, constipation, melena or hematochezia. GU:  No urgency, frequency, dysuria, or hematuria. Musculoskeletal:  No back pain.  No joint pain.  No muscle tenderness. Extremities:  No pain or swelling. Skin:  No rashes or skin changes. Neuro:  No headache, numbness or weakness, balance or coordination issues. Endocrine:  No diabetes, thyroid issues, hot flashes or night sweats. Psych:  No mood changes, depression or anxiety. Pain:  No focal pain. Review of systems:  All other systems reviewed and found to be negative. As per HPI. Otherwise, a complete review of systems is negatve.  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    Coronary artery disease, status post bypass in 1997.    Diabetes mellitus, diagnosed in 3/03.       Past Surgical History   Status post bypass surgery in 8/97.    Status post prostate biopsy in 1998.    Status post appendectomy in 1961.   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   Status post hernia repair.     Family History   No family history of colorectal cancer, breast cancer, or ovarian cancer. No history of blood clots, liver disease, anemia, or diabetes.      Social History   Positive for tobacco use from [phone]. No alcohol use. No radiation exposure. No recreational drug use.  Positive for blood transfusion in 1981 related to a bleeding peptic ulcer.   ADVANCED DIRECTIVES:  Patient does not have any living will or healthcare power of attorney.  Information was given .  Available resources had been discussed.  We will follow-up on subsequent appointments regarding this issue HEALTH MAINTENANCE: Social History  Substance Use Topics  . Smoking status: Never Smoker   . Smokeless tobacco: Never Used  . Alcohol Use: No      Allergies  Allergen Reactions  . Erythromycin Other (See Comments)    Swelling of hands    Current Outpatient Prescriptions  Medication Sig Dispense Refill  . atenolol (TENORMIN) 25 MG tablet TAKE 1 TABLET (25 MG) EVERY DAY    . atorvastatin (LIPITOR) 10 MG tablet Take by mouth.    . calcium carbonate (OS-CAL - DOSED IN MG OF ELEMENTAL CALCIUM) 1250 (500 CA) MG tablet Take 1 tablet by mouth daily.    . folic acid (FOLVITE) 1 MG tablet Take 1 mg by mouth daily.    Marland Kitchen glipiZIDE (GLUCOTROL) 5 MG tablet TAKE ONE TABLET DAILY WITH FOOD FOR DIABETES    . Multiple Vitamins-Minerals (MULTIVITAMIN & MINERAL PO) Take 1 tablet by mouth daily.     . Multiple Vitamins-Minerals (PRESERVISION AREDS 2 PO) Take 1 capsule by mouth daily.     . Omega-3 Fatty Acids (FISH OIL) 1000 MG CAPS Take 1,000 mg by mouth daily.      No current facility-administered medications for this visit.    OBJECTIVE:  Filed Vitals:   04/17/16 1110  BP: 110/61  Pulse: 88  Temp: 97.2  F (36.2 C)  Resp: 18     There is no height on file to calculate BMI.    ECOG FS:0 - Asymptomatic  PHYSICAL EXAM: General  status: Performance status is good.  Patient has not lost significant weight. Since last evaluation there is no significant change in the general status HEENT: No evidence of stomatitis. Sclera and conjunctivae :: No jaundice.   pale looking. Lungs: Air  entry equal on both sides.  No rhonchi.  No rales.  Cardiac: Heart sounds are normal.  No pericardial rub.  No murmur. Lymphatic system: Cervical, axillary, inguinal, lymph nodes not palpable GI: Abdomen is soft.liver and spleen not palpable.  No ascites.  Bowel sounds are normal.  No other palpable masses.  No tenderness . Lower extremity: No edema Neurological system: Higher functions, cranial nerves intact no evidence of peripheral neuropathy. Skin: No rash.  No ecchymosis.. No petechial hemorrhages   LAB RESULTS:  CBC Latest Ref Rng 04/17/2016 10/18/2015  WBC 3.8 - 10.6 K/uL 8.2 10.8(H)  Hemoglobin 13.0 - 18.0 g/dL 14.3 15.3  Hematocrit 40.0 - 52.0 % 42.6  45.7  Platelets 150 - 440 K/uL 266 275    Appointment on 04/17/2016  Component Date Value Ref Range Status  . WBC 04/17/2016 8.2  3.8 - 10.6 K/uL Final  . RBC 04/17/2016 4.64  4.40 - 5.90 MIL/uL Final  . Hemoglobin 04/17/2016 14.3  13.0 - 18.0 g/dL Final  . HCT 04/17/2016 42.6  40.0 - 52.0 % Final  . MCV 04/17/2016 91.8  80.0 - 100.0 fL Final  . MCH 04/17/2016 30.8  26.0 - 34.0 pg Final  . MCHC 04/17/2016 33.6  32.0 - 36.0 g/dL Final  . RDW 04/17/2016 13.8  11.5 - 14.5 % Final  . Platelets 04/17/2016 266  150 - 440 K/uL Final  . Neutrophils Relative % 04/17/2016 57   Final  . Neutro Abs 04/17/2016 4.7  1.4 - 6.5 K/uL Final  . Lymphocytes Relative 04/17/2016 28   Final  . Lymphs Abs 04/17/2016 2.2  1.0 - 3.6 K/uL Final  . Monocytes Relative 04/17/2016 12   Final  . Monocytes Absolute 04/17/2016 1.0  0.2 - 1.0 K/uL Final  . Eosinophils Relative  04/17/2016 2   Final  . Eosinophils Absolute 04/17/2016 0.2  0 - 0.7 K/uL Final  . Basophils Relative 04/17/2016 1   Final  . Basophils Absolute 04/17/2016 0.1  0 - 0.1 K/uL Final  . Sodium 04/17/2016 139  135 - 145 mmol/L Final  . Potassium 04/17/2016 4.0  3.5 - 5.1 mmol/L Final  . Chloride 04/17/2016 106  101 - 111 mmol/L Final  . CO2 04/17/2016 23  22 - 32 mmol/L Final  . Glucose, Bld 04/17/2016 127* 65 - 99 mg/dL Final  . BUN 04/17/2016 18  6 - 20 mg/dL Final  . Creatinine, Ser 04/17/2016 1.04  0.61 - 1.24 mg/dL Final  . Calcium 04/17/2016 9.3  8.9 - 10.3 mg/dL Final  . Total Protein 04/17/2016 7.2  6.5 - 8.1 g/dL Final  . Albumin 04/17/2016 4.3  3.5 - 5.0 g/dL Final  . AST 04/17/2016 32  15 - 41 U/L Final  . ALT 04/17/2016 25  17 - 63 U/L Final  . Alkaline Phosphatase 04/17/2016 66  38 - 126 U/L Final  . Total Bilirubin 04/17/2016 1.1  0.3 - 1.2 mg/dL Final  . GFR calc non Af Amer 04/17/2016 >60  >60 mL/min Final  . GFR calc Af Amer 04/17/2016 >60  >60 mL/min Final   Comment: (NOTE) The eGFR has been calculated using the CKD EPI equation. This calculation has not been validated in all clinical situations. eGFR's persistently <60 mL/min signify possible Chronic Kidney Disease.   . Anion gap 04/17/2016 10  5 - 15 Final         ASSESSMENT:  Autoimmune hemolytic anemia.  Patient is off all prednisone therapy and Rituxan therapy Hemoglobin is stable at 14.3 Mild lymphocytosis states to be followed  Assessment and Plan: Impression:   1. Coombs positive hemolytic anemia.  2. B- Cell lymphoma.   3. Coombs positive hemolytic anemia. He is status post rituximab/prednisone therapy. Hgb is 14.8 and stable today.  4 B Cell Lymphoma. Clinically there is no evidence of disease. Will continue with observation.  Hemoglobin is stable. Peripheral neuropathy and foot drop is secondary to diabetes Plan continue observation at present time. Because of my planned retirement I gave   patient a choice to be followed by oncologist versus primary care physician can check regular CBC and if hemoglobin starts drifting downwards patient can be reevaluated.  he is agreeable  with the plan to be followed by primary care physician    Patient expressed understanding and was in agreement with this plan. He also understands that He can call clinic at any time with any questions, concerns, or complaints.    No matching staging information was found for the patient.  Forest Gleason, MD   04/17/2016 11:45 AM

## 2016-04-19 ENCOUNTER — Encounter: Payer: Self-pay | Admitting: Oncology

## 2017-09-07 DIAGNOSIS — R202 Paresthesia of skin: Secondary | ICD-10-CM | POA: Insufficient documentation

## 2017-09-07 DIAGNOSIS — M21372 Foot drop, left foot: Secondary | ICD-10-CM | POA: Insufficient documentation

## 2017-09-07 DIAGNOSIS — G5603 Carpal tunnel syndrome, bilateral upper limbs: Secondary | ICD-10-CM | POA: Insufficient documentation

## 2017-09-07 DIAGNOSIS — R200 Anesthesia of skin: Secondary | ICD-10-CM | POA: Insufficient documentation

## 2018-10-17 ENCOUNTER — Emergency Department (HOSPITAL_COMMUNITY): Payer: Medicare Other

## 2018-10-17 ENCOUNTER — Encounter (HOSPITAL_COMMUNITY): Payer: Self-pay | Admitting: Emergency Medicine

## 2018-10-17 ENCOUNTER — Other Ambulatory Visit: Payer: Self-pay

## 2018-10-17 ENCOUNTER — Inpatient Hospital Stay (HOSPITAL_COMMUNITY)
Admission: EM | Admit: 2018-10-17 | Discharge: 2018-10-19 | DRG: 065 | Disposition: A | Discharge status: Rehabilitation Facility | Payer: Medicare Other | Attending: Internal Medicine | Admitting: Internal Medicine

## 2018-10-17 DIAGNOSIS — I2510 Atherosclerotic heart disease of native coronary artery without angina pectoris: Secondary | ICD-10-CM | POA: Diagnosis present

## 2018-10-17 DIAGNOSIS — R29701 NIHSS score 1: Secondary | ICD-10-CM | POA: Diagnosis present

## 2018-10-17 DIAGNOSIS — I6300 Cerebral infarction due to thrombosis of unspecified precerebral artery: Secondary | ICD-10-CM | POA: Diagnosis not present

## 2018-10-17 DIAGNOSIS — Z881 Allergy status to other antibiotic agents status: Secondary | ICD-10-CM

## 2018-10-17 DIAGNOSIS — S80812A Abrasion, left lower leg, initial encounter: Secondary | ICD-10-CM | POA: Diagnosis present

## 2018-10-17 DIAGNOSIS — X58XXXA Exposure to other specified factors, initial encounter: Secondary | ICD-10-CM | POA: Diagnosis present

## 2018-10-17 DIAGNOSIS — R2689 Other abnormalities of gait and mobility: Secondary | ICD-10-CM | POA: Diagnosis present

## 2018-10-17 DIAGNOSIS — Z791 Long term (current) use of non-steroidal anti-inflammatories (NSAID): Secondary | ICD-10-CM | POA: Diagnosis not present

## 2018-10-17 DIAGNOSIS — N182 Chronic kidney disease, stage 2 (mild): Secondary | ICD-10-CM | POA: Diagnosis present

## 2018-10-17 DIAGNOSIS — I69398 Other sequelae of cerebral infarction: Secondary | ICD-10-CM | POA: Diagnosis not present

## 2018-10-17 DIAGNOSIS — R531 Weakness: Secondary | ICD-10-CM | POA: Diagnosis present

## 2018-10-17 DIAGNOSIS — Z9081 Acquired absence of spleen: Secondary | ICD-10-CM | POA: Diagnosis not present

## 2018-10-17 DIAGNOSIS — I159 Secondary hypertension, unspecified: Secondary | ICD-10-CM | POA: Diagnosis not present

## 2018-10-17 DIAGNOSIS — E785 Hyperlipidemia, unspecified: Secondary | ICD-10-CM | POA: Diagnosis present

## 2018-10-17 DIAGNOSIS — I361 Nonrheumatic tricuspid (valve) insufficiency: Secondary | ICD-10-CM | POA: Diagnosis not present

## 2018-10-17 DIAGNOSIS — I739 Peripheral vascular disease, unspecified: Secondary | ICD-10-CM | POA: Diagnosis not present

## 2018-10-17 DIAGNOSIS — E1142 Type 2 diabetes mellitus with diabetic polyneuropathy: Secondary | ICD-10-CM | POA: Diagnosis present

## 2018-10-17 DIAGNOSIS — Z7902 Long term (current) use of antithrombotics/antiplatelets: Secondary | ICD-10-CM | POA: Diagnosis not present

## 2018-10-17 DIAGNOSIS — Z79899 Other long term (current) drug therapy: Secondary | ICD-10-CM | POA: Diagnosis not present

## 2018-10-17 DIAGNOSIS — I6381 Other cerebral infarction due to occlusion or stenosis of small artery: Secondary | ICD-10-CM | POA: Diagnosis not present

## 2018-10-17 DIAGNOSIS — R269 Unspecified abnormalities of gait and mobility: Secondary | ICD-10-CM | POA: Diagnosis not present

## 2018-10-17 DIAGNOSIS — K5900 Constipation, unspecified: Secondary | ICD-10-CM | POA: Diagnosis present

## 2018-10-17 DIAGNOSIS — G5600 Carpal tunnel syndrome, unspecified upper limb: Secondary | ICD-10-CM | POA: Diagnosis present

## 2018-10-17 DIAGNOSIS — E7801 Familial hypercholesterolemia: Secondary | ICD-10-CM | POA: Diagnosis not present

## 2018-10-17 DIAGNOSIS — Z951 Presence of aortocoronary bypass graft: Secondary | ICD-10-CM | POA: Diagnosis not present

## 2018-10-17 DIAGNOSIS — R2681 Unsteadiness on feet: Secondary | ICD-10-CM | POA: Diagnosis present

## 2018-10-17 DIAGNOSIS — Z7982 Long term (current) use of aspirin: Secondary | ICD-10-CM | POA: Diagnosis not present

## 2018-10-17 DIAGNOSIS — E1122 Type 2 diabetes mellitus with diabetic chronic kidney disease: Secondary | ICD-10-CM | POA: Diagnosis present

## 2018-10-17 DIAGNOSIS — I63332 Cerebral infarction due to thrombosis of left posterior cerebral artery: Secondary | ICD-10-CM | POA: Diagnosis not present

## 2018-10-17 DIAGNOSIS — E1169 Type 2 diabetes mellitus with other specified complication: Secondary | ICD-10-CM | POA: Diagnosis not present

## 2018-10-17 DIAGNOSIS — E119 Type 2 diabetes mellitus without complications: Secondary | ICD-10-CM

## 2018-10-17 DIAGNOSIS — I10 Essential (primary) hypertension: Secondary | ICD-10-CM

## 2018-10-17 DIAGNOSIS — I129 Hypertensive chronic kidney disease with stage 1 through stage 4 chronic kidney disease, or unspecified chronic kidney disease: Secondary | ICD-10-CM | POA: Diagnosis present

## 2018-10-17 DIAGNOSIS — E1151 Type 2 diabetes mellitus with diabetic peripheral angiopathy without gangrene: Secondary | ICD-10-CM | POA: Diagnosis present

## 2018-10-17 DIAGNOSIS — I6930 Unspecified sequelae of cerebral infarction: Secondary | ICD-10-CM | POA: Diagnosis not present

## 2018-10-17 DIAGNOSIS — M21372 Foot drop, left foot: Secondary | ICD-10-CM | POA: Diagnosis present

## 2018-10-17 DIAGNOSIS — Z8572 Personal history of non-Hodgkin lymphomas: Secondary | ICD-10-CM | POA: Diagnosis not present

## 2018-10-17 DIAGNOSIS — Z87891 Personal history of nicotine dependence: Secondary | ICD-10-CM | POA: Diagnosis not present

## 2018-10-17 DIAGNOSIS — K5901 Slow transit constipation: Secondary | ICD-10-CM | POA: Diagnosis not present

## 2018-10-17 DIAGNOSIS — S80811A Abrasion, right lower leg, initial encounter: Secondary | ICD-10-CM | POA: Diagnosis present

## 2018-10-17 DIAGNOSIS — C8590 Non-Hodgkin lymphoma, unspecified, unspecified site: Secondary | ICD-10-CM | POA: Diagnosis present

## 2018-10-17 DIAGNOSIS — I639 Cerebral infarction, unspecified: Secondary | ICD-10-CM

## 2018-10-17 HISTORY — DX: Type 2 diabetes mellitus without complications: E11.9

## 2018-10-17 HISTORY — DX: Unspecified intestinal obstruction, unspecified as to partial versus complete obstruction: K56.609

## 2018-10-17 LAB — COMPREHENSIVE METABOLIC PANEL
ALBUMIN: 4 g/dL (ref 3.5–5.0)
ALT: 21 U/L (ref 0–44)
ANION GAP: 8 (ref 5–15)
AST: 25 U/L (ref 15–41)
Alkaline Phosphatase: 53 U/L (ref 38–126)
BUN: 24 mg/dL — AB (ref 8–23)
CHLORIDE: 109 mmol/L (ref 98–111)
CO2: 23 mmol/L (ref 22–32)
Calcium: 9.4 mg/dL (ref 8.9–10.3)
Creatinine, Ser: 1.13 mg/dL (ref 0.61–1.24)
GFR calc non Af Amer: 54 mL/min — ABNORMAL LOW (ref >60)
GLUCOSE: 101 mg/dL — AB (ref 70–99)
Potassium: 4.3 mmol/L (ref 3.5–5.1)
SODIUM: 140 mmol/L (ref 135–145)
Total Bilirubin: 0.6 mg/dL (ref 0.3–1.2)
Total Protein: 7.1 g/dL (ref 6.5–8.1)

## 2018-10-17 LAB — DIFFERENTIAL
Abs Immature Granulocytes: 0.02 10*3/uL (ref 0.00–0.07)
BASOS ABS: 0.1 10*3/uL (ref 0.0–0.1)
BASOS PCT: 1 %
EOS PCT: 2 %
Eosinophils Absolute: 0.1 10*3/uL (ref 0.0–0.5)
IMMATURE GRANULOCYTES: 0 %
LYMPHS PCT: 39 %
Lymphs Abs: 2.9 10*3/uL (ref 0.7–4.0)
Monocytes Absolute: 0.8 10*3/uL (ref 0.1–1.0)
Monocytes Relative: 11 %
NEUTROS ABS: 3.6 10*3/uL (ref 1.7–7.7)
NEUTROS PCT: 47 %

## 2018-10-17 LAB — CBC
HEMATOCRIT: 47.2 % (ref 39.0–52.0)
HEMOGLOBIN: 14.5 g/dL (ref 13.0–17.0)
MCH: 29.9 pg (ref 26.0–34.0)
MCHC: 30.7 g/dL (ref 30.0–36.0)
MCV: 97.3 fL (ref 80.0–100.0)
Platelets: 312 10*3/uL (ref 150–400)
RBC: 4.85 MIL/uL (ref 4.22–5.81)
RDW: 14.3 % (ref 11.5–15.5)
WBC: 7.5 10*3/uL (ref 4.0–10.5)
nRBC: 0 % (ref 0.0–0.2)

## 2018-10-17 LAB — I-STAT TROPONIN, ED: Troponin i, poc: 0.01 ng/mL (ref 0.00–0.08)

## 2018-10-17 LAB — PROTIME-INR
INR: 1
Prothrombin Time: 13.1 seconds (ref 11.4–15.2)

## 2018-10-17 LAB — APTT: APTT: 29 s (ref 24–36)

## 2018-10-17 MED ORDER — HEPARIN SODIUM (PORCINE) 5000 UNIT/ML IJ SOLN
5000.0000 [IU] | Freq: Three times a day (TID) | INTRAMUSCULAR | Status: DC
  Administered 2018-10-18 – 2018-10-19 (×5): 5000 [IU] via SUBCUTANEOUS
  Filled 2018-10-17 (×5): qty 1

## 2018-10-17 MED ORDER — IBUPROFEN 200 MG PO TABS: 200.0000 mg | ORAL_TABLET | Freq: Every day | ORAL | Status: DC | PRN

## 2018-10-17 MED ORDER — CALCIUM CARBONATE 1250 (500 CA) MG PO TABS
1250.0000 mg | ORAL_TABLET | Freq: Every day | ORAL | Status: DC
  Administered 2018-10-18 – 2018-10-19 (×2): 1250 mg via ORAL
  Filled 2018-10-17 (×2): qty 1

## 2018-10-17 MED ORDER — SODIUM CHLORIDE 0.9 % IV SOLN
INTRAVENOUS | Status: DC
  Administered 2018-10-18: 01:00:00 via INTRAVENOUS

## 2018-10-17 MED ORDER — ACETAMINOPHEN 160 MG/5ML PO SOLN: 650.0000 mg | ORAL | Status: DC | PRN

## 2018-10-17 MED ORDER — INSULIN ASPART 100 UNIT/ML ~~LOC~~ SOLN: 0.0000 [IU] | Freq: Three times a day (TID) | SUBCUTANEOUS | Status: DC

## 2018-10-17 MED ORDER — ACETAMINOPHEN 650 MG RE SUPP: 650.0000 mg | RECTAL | Status: DC | PRN

## 2018-10-17 MED ORDER — PROSIGHT PO TABS
1.0000 | ORAL_TABLET | Freq: Every day | ORAL | Status: DC
  Administered 2018-10-18 – 2018-10-19 (×2): 1 via ORAL
  Filled 2018-10-17 (×2): qty 1

## 2018-10-17 MED ORDER — ACETAMINOPHEN 325 MG PO TABS: 650.0000 mg | ORAL_TABLET | ORAL | Status: DC | PRN

## 2018-10-17 MED ORDER — OMEGA-3-ACID ETHYL ESTERS 1 G PO CAPS
1.0000 g | ORAL_CAPSULE | Freq: Every day | ORAL | Status: DC
  Administered 2018-10-18 – 2018-10-19 (×2): 1 g via ORAL
  Filled 2018-10-17 (×2): qty 1

## 2018-10-17 MED ORDER — INSULIN ASPART 100 UNIT/ML ~~LOC~~ SOLN: 0.0000 [IU] | Freq: Every day | SUBCUTANEOUS | Status: DC

## 2018-10-17 MED ORDER — FOLIC ACID 1 MG PO TABS
1.0000 mg | ORAL_TABLET | Freq: Every day | ORAL | Status: DC
  Administered 2018-10-18 – 2018-10-19 (×2): 1 mg via ORAL
  Filled 2018-10-17 (×2): qty 1

## 2018-10-17 MED ORDER — STROKE: EARLY STAGES OF RECOVERY BOOK
Freq: Once | Status: DC
  Filled 2018-10-17: qty 1

## 2018-10-17 MED ORDER — GLIPIZIDE 5 MG PO TABS
5.0000 mg | ORAL_TABLET | Freq: Every day | ORAL | Status: DC
  Administered 2018-10-18 – 2018-10-19 (×2): 5 mg via ORAL
  Filled 2018-10-17 (×2): qty 1

## 2018-10-17 MED ORDER — ATENOLOL 25 MG PO TABS
25.0000 mg | ORAL_TABLET | Freq: Every day | ORAL | Status: DC
  Administered 2018-10-18: 25 mg via ORAL
  Filled 2018-10-17: qty 1

## 2018-10-17 MED ORDER — ADULT MULTIVITAMIN W/MINERALS CH
1.0000 | ORAL_TABLET | Freq: Every day | ORAL | Status: DC
  Administered 2018-10-18 – 2018-10-19 (×2): 1 via ORAL
  Filled 2018-10-17 (×2): qty 1

## 2018-10-17 MED ORDER — SIMVASTATIN 20 MG PO TABS: 20.0000 mg | ORAL_TABLET | ORAL | Status: DC

## 2018-10-17 MED ORDER — ASPIRIN 325 MG PO TABS
325.0000 mg | ORAL_TABLET | Freq: Every day | ORAL | Status: DC
  Administered 2018-10-18: 325 mg via ORAL
  Filled 2018-10-17: qty 1

## 2018-10-17 MED ORDER — SENNOSIDES-DOCUSATE SODIUM 8.6-50 MG PO TABS: 1.0000 | ORAL_TABLET | Freq: Every evening | ORAL | Status: DC | PRN

## 2018-10-17 NOTE — ED Provider Notes (Signed)
[place] EMERGENCY DEPARTMENT Provider Note   CSN: [id number] Arrival date & time: 10/17/18  1741     History   Chief Complaint Chief Complaint  Patient presents with  . Weakness    HPI Zachary Spence is a 84 y.o. male history of diabetes, hypertension, non-Hodgkin's lymphoma here presenting with dizziness, weakness.  Patient states that he drove to churchthis morning and got to church around 8:30 AM.  He noticed that he seemed to be weaker on the right side.  He states that he has left foot drop and has a brace on the left side.  However he noticed that he is less steady.  He finished going to church and came home.  While at home the son noticed that he seems to be less steady.  He denies any dizziness or lightheadedness.  He went to urgent care and was noted to have a positive Romberg sign and sent here for possible stroke.  She denies any history of stroke previously.  The history is provided by the patient.    Past Medical History:  Diagnosis Date  . Bowel obstruction ([place])   . Diabetes mellitus without complication ([place])   . Hypertension   . Non Hodgkin's lymphoma [facility])     Patient Active Problem List   Diagnosis Date Noted  . CVA (cerebral vascular accident) ([place]) 10/17/2018  . Burning sensation of the foot 04/12/2015  . Leg weakness 01/08/2015  . Carpal tunnel syndrome 12/26/2014  . Dropfoot 12/26/2014  . Neuralgia neuritis, sciatic nerve 12/26/2014  . Diabetes mellitus ([place]) 08/24/2014  . BP (high blood pressure) 08/24/2014  . HLD (hyperlipidemia) 08/24/2014  . H/O coronary artery bypass surgery 08/24/2014    Past Surgical History:  Procedure Laterality Date  . APPENDECTOMY    . CORONARY ARTERY BYPASS GRAFT    . SPLENECTOMY          Home Medications    Prior to Admission medications   Medication Sig Start Date End Date Taking? Authorizing Provider  atenolol (TENORMIN) 25 MG tablet Take 25 mg by mouth daily.  08/23/15  Yes  [provider]  calcium carbonate (CALCIUM 600) 600 MG TABS tablet Take 600 mg by mouth daily.   Yes [provider]  folic acid (FOLVITE) 1 MG tablet Take 1 mg by mouth daily.   Yes [provider]  glipiZIDE (GLUCOTROL) 5 MG tablet Take 5 mg by mouth daily.  08/23/15  Yes [provider]  ibuprofen (ADVIL,MOTRIN) 200 MG tablet Take 200 mg by mouth daily as needed for headache (pain).   Yes [provider]  Multiple Vitamin (MULTIVITAMIN WITH MINERALS) TABS tablet Take 1 tablet by mouth daily.   Yes [provider]  Multiple Vitamins-Minerals (PRESERVISION AREDS 2 PO) Take 1 capsule by mouth daily.    Yes [provider]  Omega-3 Fatty Acids (FISH OIL) 1000 MG CAPS Take 1,000 mg by mouth daily.    Yes [provider]  simvastatin (ZOCOR) 40 MG tablet Take 20 mg by mouth See admin instructions. Take 1/2 tablet (20 mg) by mouth every Tuesday, Thursday, Saturday   Yes [provider]    Family History No family history on file.  Social History Social History   Tobacco Use  . Smoking status: Former Research scientist (life sciences)  . Smokeless tobacco: Never Used  Substance Use Topics  . Alcohol use: No    Alcohol/week: 0.0 standard drinks  . Drug use: Never     Allergies  Erythromycin   Review of Systems Review of Systems  Neurological: Positive for weakness.  All other systems reviewed and are negative.    Physical Exam Updated Vital Signs BP (!) 119/58   Pulse 63   Temp 98.1 F (36.7 C) (Oral)   Resp 16   Ht 6' (1.829 m)   Wt 84.4 kg   SpO2 94%   BMI 25.23 kg/m   Physical Exam  Constitutional: He is oriented to person, place, and time.  Well appearing for age, no obvious facial droop   HENT:  Head: Normocephalic and atraumatic.  Mouth/Throat: Oropharynx is clear and moist.  Eyes: Pupils are equal, round, and reactive to light. Conjunctivae and EOM are normal.  Neck: Normal range of motion. Neck supple.    Cardiovascular: Normal rate, regular rhythm and normal heart sounds.  Pulmonary/Chest: Effort normal and breath sounds normal. No stridor. No respiratory distress.  Abdominal: Soft. Bowel sounds are normal. He exhibits no distension. There is no tenderness.  Musculoskeletal: Normal range of motion.  Neurological: He is alert and oriented to person, place, and time.  No obvious facial droop. Nl strength throughout. There is abnormal finger to nose on the right side, abnormal heel to shin on R side. + rhomberg with falling to the right. No pronator drift.   Skin: Skin is warm.  Psychiatric: He has a normal mood and affect.  Nursing note and vitals reviewed.    ED Treatments / Results  Labs (all labs ordered are listed, but only abnormal results are displayed) Labs Reviewed  COMPREHENSIVE METABOLIC PANEL - Abnormal; Notable for the following components:      Result Value   Glucose, Bld 101 (*)    BUN 24 (*)    GFR calc non Af Amer 54 (*)    All other components within normal limits  PROTIME-INR  APTT  CBC  DIFFERENTIAL  I-STAT TROPONIN, ED    EKG EKG Interpretation  Date/Time:  Sunday October 17 2018 17:51:57 EDT Ventricular Rate:  64 PR Interval:  166 QRS Duration: 90 QT Interval:  398 QTC Calculation: 410 R Axis:   9 Text Interpretation:  Normal sinus rhythm Normal ECG No significant change since last tracing Confirmed by Wandra Arthurs [phone]) on 10/17/2018 6:37:43 PM   Radiology Ct Head Wo Contrast  Result Date: 10/17/2018 CLINICAL DATA:  Unsteady gait. Leg weakness. Symptoms since this morning. EXAM: CT HEAD WITHOUT CONTRAST TECHNIQUE: Contiguous axial images were obtained from the base of the skull through the vertex without intravenous contrast. COMPARISON:  11/18/2015 FINDINGS: Brain: No evidence of acute infarction, hemorrhage, hydrocephalus, extra-axial collection or mass lesion/mass effect. There is mild ventricular and sulcal enlargement reflecting  age-appropriate volume loss. Small deep white matter lacunar infarct in the left parietal lobe. Small old lacunar infarct in the left caudate nucleus head. Small old lacunar infarct, right cerebellum. Vascular: No hyperdense vessel or unexpected calcification. Skull: Normal. Negative for fracture or focal lesion. Sinuses/Orbits: Globes and orbits are unremarkable. The visualized sinuses and mastoid air cells are clear. Other: None. IMPRESSION: 1. No acute intracranial abnormalities. 2. Age-appropriate volume loss.  Three small old lacunar infarcts. Electronically Signed   By: Lajean Manes M.D.   On: 10/17/2018 18:39   Mr Brain Wo Contrast  Result Date: 10/17/2018 CLINICAL DATA:  Unsteady gait and lower extremity weakness. EXAM: MRI HEAD WITHOUT CONTRAST TECHNIQUE: Multiplanar, multiecho pulse sequences of the brain and surrounding structures were obtained without intravenous contrast. COMPARISON:  Head CT 10/17/2018 FINDINGS: BRAIN:  Focus of abnormal diffusion restriction within the posterior left corona radiata. No other diffusion abnormality. The midline structures are normal. No midline shift or other mass effect. Old right cerebellar infarct. Old left caudate and left corona radiata infarcts. Mild periventricular white matter hyperintensity. Generalized atrophy without lobar predilection. Susceptibility-sensitive sequences show no chronic microhemorrhage or superficial siderosis. VASCULAR: Major intracranial arterial and venous sinus flow voids are normal. SKULL AND UPPER CERVICAL SPINE: Calvarial bone marrow signal is normal. There is no skull base mass. Visualized upper cervical spine and soft tissues are normal. SINUSES/ORBITS: No fluid levels or advanced mucosal thickening. No mastoid or middle ear effusion. The orbits are normal. IMPRESSION: 1. Small focus of acute ischemia within the posterior left corona radiata. No hemorrhage or mass effect. 2. Findings of chronic small vessel ischemia, including  multiple old lacunar infarcts. Electronically Signed   By: Ulyses Jarred M.D.   On: 10/17/2018 21:17    Procedures Procedures (including critical care time)  Medications Ordered in ED Medications - No data to display   Initial Impression / Assessment and Plan / ED Course  I have reviewed the triage vital signs and the nursing notes.  Pertinent labs & imaging results that were available during my care of the patient were reviewed by me and considered in my medical decision making (see chart for details).    Zachary Spence is a 84 y.o. male here with unsteadiness, abnormal cerebellar signs on neuro exam. I am concerned for cerebellar stroke. Will get labs, CT head. If CT showed no obvious stroke, will need MRI.   10:09 PM CT unremarkable. MRI showed L coronal radiata stroke. I called Dr. Malen Gauze from neurology to see patient. Hospitalist to admit.     Final Clinical Impressions(s) / ED Diagnoses   Final diagnoses:  None    ED Discharge Orders    None       Drenda Freeze, MD 10/17/18 2210

## 2018-10-17 NOTE — ED Notes (Signed)
Patient transported to MRI 

## 2018-10-17 NOTE — ED Triage Notes (Signed)
Pt reports unsteady gait and weakness to bilateral legs since 8:30am.  States he drove to church and noticed weakness when he got out of the car.  Stayed at church and drove back home after service.  Seen at [place] at [facility] and sent to ED for ? Stroke.  No arm drift.  Speech clear.

## 2018-10-17 NOTE — H&P (Addendum)
History and Physical   Zachary Spence IRW:[id number] DOB: [birth year] DOA: 10/17/2018  Referring MD/NP/PA: Dr Darl Householder  PCP: Tracie Harrier, MD   Outpatient Specialists: [facility]   Patient coming from: Home  Chief Complaint: Weakness and gait abnormality  HPI: Zachary Spence is a 84 y.o. male with medical history significant of coronary artery disease status post coronary artery bypass grafting since 1997, hypertension, diabetes, non-Hodgkin's lymphoma who presented with sudden onset of dizziness and weakness today.  Patient is still driving and went to church today around 8:30 in the morning.  On returning home he started feeling dizzy and generalized weakness.  He came to the ER where he was seen on and evaluated.  He was brought in by his son who is at bedside.  Patient is fully awake alert and oriented.  No focal weakness.  Denied any visual changes.  No nausea or vomiting.  Patient has had numbness in his hands bilaterally from previous bilateral carpal tunnel with surgery down.  MRI confirms acute CVA hence he is being admitted for further work-up.  ED Course: Temperature is 98 blood pressure 133/82 pulse 64 respiratory rate of 18 oxygen sat 94% room air.  White count is 7.5 hemoglobin 14.5 platelet 312.  BUN is 28 with creatinine 1.13 glucose 101.  Other electrolytes are within normal.  Head CT without contrast showed no acute findings.  MRI of the brain showed Small focus of acute ischemia within the posterior left corona radiata. No hemorrhage or mass effect.  Is being admitted for stroke work-up.  Neurology has been consulted.  Review of Systems: As per HPI otherwise 10 point review of systems negative.    Past Medical History:  Diagnosis Date  . Bowel obstruction ([place])   . Diabetes mellitus without complication ([place])   . Hypertension   . Non Hodgkin's lymphoma [facility])     Past Surgical History:  Procedure Laterality Date  . APPENDECTOMY    . CORONARY ARTERY BYPASS  GRAFT    . SPLENECTOMY       reports that he has quit smoking. He has never used smokeless tobacco. He reports that he does not drink alcohol or use drugs.  Allergies  Allergen Reactions  . Erythromycin Swelling    Swelling of hands    No family history on file.   Prior to Admission medications   Medication Sig Start Date End Date Taking? Authorizing Provider  atenolol (TENORMIN) 25 MG tablet Take 25 mg by mouth daily.  08/23/15  Yes [provider]  calcium carbonate (CALCIUM 600) 600 MG TABS tablet Take 600 mg by mouth daily.   Yes [provider]  folic acid (FOLVITE) 1 MG tablet Take 1 mg by mouth daily.   Yes [provider]  glipiZIDE (GLUCOTROL) 5 MG tablet Take 5 mg by mouth daily.  08/23/15  Yes [provider]  ibuprofen (ADVIL,MOTRIN) 200 MG tablet Take 200 mg by mouth daily as needed for headache (pain).   Yes [provider]  Multiple Vitamin (MULTIVITAMIN WITH MINERALS) TABS tablet Take 1 tablet by mouth daily.   Yes [provider]  Multiple Vitamins-Minerals (PRESERVISION AREDS 2 PO) Take 1 capsule by mouth daily.    Yes [provider]  Omega-3 Fatty Acids (FISH OIL) 1000 MG CAPS Take 1,000 mg by mouth daily.    Yes [provider]  simvastatin (ZOCOR) 40 MG tablet Take 20 mg by mouth See admin instructions. Take 1/2 tablet (20 mg) by mouth  every Tuesday, Thursday, Saturday   Yes [provider]    Physical Exam: Vitals:   10/17/18 1845 10/17/18 1900 10/17/18 2000 10/17/18 2121  BP: 133/82 123/70 111/76 (!) 119/58  Pulse: 64 62 62 63  Resp: 17 15 18 16   Temp:      TempSrc:      SpO2: 96% 95% 96% 94%  Weight:      Height:          Constitutional: NAD, calm, comfortable Vitals:   10/17/18 1845 10/17/18 1900 10/17/18 2000 10/17/18 2121  BP: 133/82 123/70 111/76 (!) 119/58  Pulse: 64 62 62 63  Resp: 17 15 18 16   Temp:      TempSrc:      SpO2: 96% 95% 96% 94%  Weight:        Height:       Eyes: PERRL, lids and conjunctivae normal ENMT: Mucous membranes are moist. Posterior pharynx clear of any exudate or lesions.Normal dentition.  Neck: normal, supple, no masses, no thyromegaly Respiratory: clear to auscultation bilaterally, no wheezing, no crackles. Normal respiratory effort. No accessory muscle use.  Cardiovascular: Regular rate and rhythm, no murmurs / rubs / gallops. No extremity edema. 2+ pedal pulses. No carotid bruits.  Abdomen: no tenderness, no masses palpated. No hepatosplenomegaly. Bowel sounds positive.  Musculoskeletal: no clubbing / cyanosis. No joint deformity upper and lower extremities. Good ROM, no contractures. Normal muscle tone.  Skin: no rashes, lesions, ulcers. No induration Neurologic: CN 2-12 grossly intact. Sensation intact, DTR normal. Strength 5/5 in all 4. Positive Romberg Psychiatric: Normal judgment and insight. Alert and oriented x 3. Normal mood.     Labs on Admission: I have personally reviewed following labs and imaging studies  CBC: Recent Labs  Lab 10/17/18 1813  WBC 7.5  NEUTROABS 3.6  HGB 14.5  HCT 47.2  MCV 97.3  PLT 841   Basic Metabolic Panel: Recent Labs  Lab 10/17/18 1813  NA 140  K 4.3  CL 109  CO2 23  GLUCOSE 101*  BUN 24*  CREATININE 1.13  CALCIUM 9.4   GFR: Estimated Creatinine Clearance: 44.8 mL/min (by C-G formula based on SCr of 1.13 mg/dL). Liver Function Tests: Recent Labs  Lab 10/17/18 1813  AST 25  ALT 21  ALKPHOS 53  BILITOT 0.6  PROT 7.1  ALBUMIN 4.0   No results for input(s): LIPASE, AMYLASE in the last 168 hours. No results for input(s): AMMONIA in the last 168 hours. Coagulation Profile: Recent Labs  Lab 10/17/18 1813  INR 1.00   Cardiac Enzymes: No results for input(s): CKTOTAL, CKMB, CKMBINDEX, TROPONINI in the last 168 hours. BNP (last 3 results) No results for input(s): PROBNP in the last 8760 hours. HbA1C: No results for input(s): HGBA1C in the last 72  hours. CBG: No results for input(s): GLUCAP in the last 168 hours. Lipid Profile: No results for input(s): CHOL, HDL, LDLCALC, TRIG, CHOLHDL, LDLDIRECT in the last 72 hours. Thyroid Function Tests: No results for input(s): TSH, T4TOTAL, FREET4, T3FREE, THYROIDAB in the last 72 hours. Anemia Panel: No results for input(s): VITAMINB12, FOLATE, FERRITIN, TIBC, IRON, RETICCTPCT in the last 72 hours. Urine analysis:    Component Value Date/Time   COLORURINE Yellow 12/30/2012 0534   APPEARANCEUR Hazy 12/30/2012 0534   LABSPEC 1.027 12/30/2012 0534   PHURINE 5.0 12/30/2012 0534   GLUCOSEU 150 mg/dL 12/30/2012 0534   HGBUR Negative 12/30/2012 [place] Negative 12/30/2012 [place] Negative 12/30/2012 0534  PROTEINUR 30 mg/dL 12/30/2012 0534   NITRITE Negative 12/30/2012 0534   LEUKOCYTESUR Negative 12/30/2012 0534   Sepsis Labs: @LABRCNTIP (procalcitonin:4,lacticidven:4) )No results found for this or any previous visit (from the past 240 hour(s)).   Radiological Exams on Admission: Ct Head Wo Contrast  Result Date: 10/17/2018 CLINICAL DATA:  Unsteady gait. Leg weakness. Symptoms since this morning. EXAM: CT HEAD WITHOUT CONTRAST TECHNIQUE: Contiguous axial images were obtained from the base of the skull through the vertex without intravenous contrast. COMPARISON:  11/18/2015 FINDINGS: Brain: No evidence of acute infarction, hemorrhage, hydrocephalus, extra-axial collection or mass lesion/mass effect. There is mild ventricular and sulcal enlargement reflecting age-appropriate volume loss. Small deep white matter lacunar infarct in the left parietal lobe. Small old lacunar infarct in the left caudate nucleus head. Small old lacunar infarct, right cerebellum. Vascular: No hyperdense vessel or unexpected calcification. Skull: Normal. Negative for fracture or focal lesion. Sinuses/Orbits: Globes and orbits are unremarkable. The visualized sinuses and mastoid air cells are clear.  Other: None. IMPRESSION: 1. No acute intracranial abnormalities. 2. Age-appropriate volume loss.  Three small old lacunar infarcts. Electronically Signed   By: Lajean Manes M.D.   On: 10/17/2018 18:39   Mr Brain Wo Contrast  Result Date: 10/17/2018 CLINICAL DATA:  Unsteady gait and lower extremity weakness. EXAM: MRI HEAD WITHOUT CONTRAST TECHNIQUE: Multiplanar, multiecho pulse sequences of the brain and surrounding structures were obtained without intravenous contrast. COMPARISON:  Head CT 10/17/2018 FINDINGS: BRAIN: Focus of abnormal diffusion restriction within the posterior left corona radiata. No other diffusion abnormality. The midline structures are normal. No midline shift or other mass effect. Old right cerebellar infarct. Old left caudate and left corona radiata infarcts. Mild periventricular white matter hyperintensity. Generalized atrophy without lobar predilection. Susceptibility-sensitive sequences show no chronic microhemorrhage or superficial siderosis. VASCULAR: Major intracranial arterial and venous sinus flow voids are normal. SKULL AND UPPER CERVICAL SPINE: Calvarial bone marrow signal is normal. There is no skull base mass. Visualized upper cervical spine and soft tissues are normal. SINUSES/ORBITS: No fluid levels or advanced mucosal thickening. No mastoid or middle ear effusion. The orbits are normal. IMPRESSION: 1. Small focus of acute ischemia within the posterior left corona radiata. No hemorrhage or mass effect. 2. Findings of chronic small vessel ischemia, including multiple old lacunar infarcts. Electronically Signed   By: Ulyses Jarred M.D.   On: 10/17/2018 21:17    EKG: Independently reviewed.  EKG shows normal sinus rhythm with a rate of 82 nonspecific ST changes.  Assessment/Plan Principal Problem:   CVA (cerebral vascular accident) ([place]) Active Problems:   Carpal tunnel syndrome   Diabetes mellitus (HCC)   BP (high blood pressure)   HLD (hyperlipidemia)   H/O  coronary artery bypass surgery     #1 acute CVA: Patient seems to have posterior circulation CVA.  We will admit him for observation.  Work-up for stroke.  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  Neurology has been consulted.  Patient said he had echocardiogram done 4 weeks ago at [facility].  We will try and get the report if not we will repeat.  Carotid Dopplers will be done.  Aspirin and statin will be initiated.  Continue according to neurology.  #2 diabetes: None insulin-dependent.  Continue glipizide and sliding scale insulin.  #3 hyperlipidemia: Again continue with statin.  #4 coronary artery disease, status post bypass surgery.  Doing better at this point.  #5 Essential Hypertension: Permissive HTN. Resume medications.   DVT prophylaxis: Heparin Code  Status: Full code Family Communication: Son who is with him Disposition Plan: Home Consults called: Neurology Dr. Malen Gauze Admission status: Inpatient  Severity of Illness: The appropriate patient status for this patient is INPATIENT. Inpatient status is judged to be reasonable and necessary in order to provide the required intensity of service to ensure the patient's safety. The patient's presenting symptoms, physical exam findings, and initial radiographic and laboratory data in the context of their chronic comorbidities is felt to place them at high risk for further clinical deterioration. Furthermore, it is not anticipated that the patient will be medically stable for discharge from the hospital within 2 midnights of admission. The following factors support the patient status of inpatient.   " The patient's presenting symptoms include gait abnormalities. " The worrisome physical exam findings include gait abnormalities. " The initial radiographic and laboratory data are worrisome because of MRI of the brain showing posterior circulation CVA. " The chronic co-morbidities include diabetes with  hypertension.   * I certify that at the point of admission it is my clinical judgment that the patient will require inpatient hospital care spanning beyond 2 midnights from the point of admission due to high intensity of service, high risk for further deterioration and high frequency of surveillance required.Barbette Merino MD [facility] Pager [phone]  If 7PM-7AM, please contact night-coverage [email or web address] Password [email or web address]  10/17/2018, 10:30 PM

## 2018-10-17 NOTE — Consult Note (Signed)
Neurology Consultation  Reason for Consult: Stroke Referring Physician: Dr. Jonelle Sidle  CC: Gait unsteadiness  History is obtained from: Chart, patient, patient's son at bedside HPI: Zachary Spence is a 84 y.o. male diabetes, hypertension, non-Hodgkin's lymphoma, presented to the emergency room for evaluation of sudden onset of dizziness and weakness along with gait disturbance. He is a very independent 84 year old who still drives, drove to the church this morning, went to Sunday school at church and walked [place] to the [place].  Sat back in his car, started driving to get home, stopped on the way to [employer] ate lunch and got home and was seen by family to be wobbly on his feet. He has a history of a left foot drop and uses a AFO for that as well as a cane for balance. 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 Reports compliance to medications. The son felt that he was not steady on his gait and I took him to an urgent care where they thought that he had a positive Romberg sign and sent in for further evaluation [facility].  An MRI was done because of concern for posterior circulation stroke and was positive for stroke in the posterior left corona radiata.   LKW: Unclear but past 4-1/2 hours for IV TPA. tpa given?: no, outside the window Premorbid modified Rankin scale (mRS): 1  ROS:  ROS was performed and is negative except as noted in the HPI.   Past Medical History:  Diagnosis Date  . Bowel obstruction ([place])   . Diabetes mellitus without complication ([place])   . Hypertension   . Non Hodgkin's lymphoma ([place])    No family history on file.  Social History:   reports that he has quit smoking. He has never used smokeless tobacco. He reports that he does not drink alcohol or use drugs.   Medications No current facility-administered medications for this encounter.   Current Outpatient Medications:  .  atenolol (TENORMIN) 25 MG tablet, Take 25 mg by  mouth daily. , Disp: , Rfl:  .  calcium carbonate (CALCIUM 600) 600 MG TABS tablet, Take 600 mg by mouth daily., Disp: , Rfl:  .  folic acid (FOLVITE) 1 MG tablet, Take 1 mg by mouth daily., Disp: , Rfl:  .  glipiZIDE (GLUCOTROL) 5 MG tablet, Take 5 mg by mouth daily. , Disp: , Rfl:  .  ibuprofen (ADVIL,MOTRIN) 200 MG tablet, Take 200 mg by mouth daily as needed for headache (pain)., Disp: , Rfl:  .  Multiple Vitamin (MULTIVITAMIN WITH MINERALS) TABS tablet, Take 1 tablet by mouth daily., Disp: , Rfl:  .  Multiple Vitamins-Minerals (PRESERVISION AREDS 2 PO), Take 1 capsule by mouth daily. , Disp: , Rfl:  .  Omega-3 Fatty Acids (FISH OIL) 1000 MG CAPS, Take 1,000 mg by mouth daily. , Disp: , Rfl:  .  simvastatin (ZOCOR) 40 MG tablet, Take 20 mg by mouth See admin instructions. Take 1/2 tablet (20 mg) by mouth every Tuesday, Thursday, Saturday, Disp: , Rfl:   Exam: Current vital signs: BP (!) 119/58   Pulse 63   Temp 98.1 F (36.7 C) (Oral)   Resp 16   Ht 6' (1.829 m)   Wt 84.4 kg   SpO2 94%   BMI 25.23 kg/m  Vital signs in last 24 hours: Temp:  [98.1 F (36.7 C)] 98.1 F (36.7 C) (10/27 1747) Pulse Rate:  [62-64] 63 (10/27 2121) Resp:  [15-18] 16 (10/27  2121) BP: (111-133)/(58-82) 119/58 (10/27 2121) SpO2:  [94 %-96 %] 94 % (10/27 2121) Weight:  [84.4 kg] 84.4 kg (10/27 1759) GENERAL: Awake, alert in NAD HEENT: - Normocephalic and atraumatic, dry mm, no LN++, no Thyromegally LUNGS - Clear to auscultation bilaterally with no wheezes CV - S1S2 RRR, no m/r/g, equal pulses bilaterally. ABDOMEN - Soft, nontender, nondistended with normoactive BS Ext: warm, well perfused, intact peripheral pulses, no edema  NEURO:  Mental Status: AA&Ox3  Language: speech is mildly dysarthric- his baseline per family but they report that it has become more dysarthric over the past for 5 years..  Naming, repetition, fluency, and comprehension intact. Cranial Nerves: PERRL. EOMI, visual fields full, no  facial asymmetry,facial sensation intact, hearing intact, tongue/uvula/soft palate midline, normal sternocleidomastoid and trapezius muscle strength. No evidence of tongue atrophy or fibrillations Motor: Symmetric 5/5 in all 4 extremities.  With the exception of 0/5 plantarflexion on the left 0/5 dorsiflexion on the left. Tone: is normal and bulk is normal Sensation- Intact to light touch bilaterally with a stocking type pattern of sensory decreased consistent with peripheral neuropathy. Coordination: FTN intact bilaterally Gait- deferred  NIHSS-ER documentation Interval: Initial (10/27 1846) Level of Consciousness (1a.)   : Alert, keenly responsive (10/27 1846) LOC Questions (1b. )   +: Answers both questions correctly (10/27 1846) LOC Commands (1c. )   + : Performs both tasks correctly (10/27 1846) Best Gaze (2. )  +: Normal (10/27 1846) Visual (3. )  +: No visual loss (10/27 1846) Facial Palsy (4. )    : Normal symmetrical movements (10/27 1846) Motor Arm, Left (5a. )   +: No drift (10/27 1846) Motor Arm, Right (5b. )   +: No drift (10/27 1846) Motor Leg, Left (6a. )   +: No drift (10/27 1846) Motor Leg, Right (6b. )   +: No drift (10/27 1846) Limb Ataxia (7. ): Absent (10/27 1846) Sensory (8. )   +: Mild-to-moderate sensory loss, patient feels pinprick is less sharp or is dull on the affected side, or there is a loss of superficial pain with pinprick, but patient is aware of being touched (10/27 1846) Best Language (9. )   +: No aphasia (10/27 1846) Dysarthria (10. ): Normal (10/27 1846) Extinction/Inattention (11.)   +: No Abnormality (10/27 1846) Modified SS Total  +: 1 (10/27 1846) Complete NIHSS TOTAL: 1 (10/27 1846)  My NIH stroke scale-0 The son endorsed that he felt less on the right when the ED was assessing him but for me he had no decreased sensation to light touch right versus left but had a stocking type reduction as above.  Labs I have reviewed labs in epic and the  results pertinent to this consultation are: CBC    Component Value Date/Time   WBC 7.5 10/17/2018 1813   RBC 4.85 10/17/2018 1813   HGB 14.5 10/17/2018 1813   HGB 14.5 04/18/2015 1333   HCT 47.2 10/17/2018 1813   HCT 43.9 04/18/2015 1333   PLT 312 10/17/2018 1813   PLT 275 04/18/2015 1333   MCV 97.3 10/17/2018 1813   MCV 94 04/18/2015 1333   MCH 29.9 10/17/2018 1813   MCHC 30.7 10/17/2018 1813   RDW 14.3 10/17/2018 1813   RDW 14.5 04/18/2015 1333   LYMPHSABS 2.9 10/17/2018 1813   LYMPHSABS 2.6 04/18/2015 1333   MONOABS 0.8 10/17/2018 1813   MONOABS 0.8 04/18/2015 1333   EOSABS 0.1 10/17/2018 1813   EOSABS 0.1 04/18/2015 1333   BASOSABS  0.1 10/17/2018 1813   BASOSABS 0.1 04/18/2015 1333   CMP     Component Value Date/Time   NA 140 10/17/2018 1813   NA 138 04/18/2015 1333   K 4.3 10/17/2018 1813   K 4.0 04/18/2015 1333   CL 109 10/17/2018 1813   CL 106 04/18/2015 1333   CO2 23 10/17/2018 1813   CO2 27 04/18/2015 1333   GLUCOSE 101 (H) 10/17/2018 1813   GLUCOSE 93 04/18/2015 1333   BUN 24 (H) 10/17/2018 1813   BUN 20 04/18/2015 1333   CREATININE 1.13 10/17/2018 1813   CREATININE 1.05 04/18/2015 1333   CALCIUM 9.4 10/17/2018 1813   CALCIUM 8.8 (L) 04/18/2015 1333   PROT 7.1 10/17/2018 1813   PROT 7.3 04/18/2015 1333   ALBUMIN 4.0 10/17/2018 1813   ALBUMIN 4.2 04/18/2015 1333   AST 25 10/17/2018 1813   AST 28 04/18/2015 1333   ALT 21 10/17/2018 1813   ALT 26 04/18/2015 1333   ALKPHOS 53 10/17/2018 1813   ALKPHOS 59 04/18/2015 1333   BILITOT 0.6 10/17/2018 1813   BILITOT 0.8 04/18/2015 1333   GFRNONAA 54 (L) 10/17/2018 1813   GFRNONAA >60 04/18/2015 1333   GFRAA >60 10/17/2018 1813   GFRAA >60 04/18/2015 1333   Imaging I have reviewed the images obtained:  MRI examination of the brain-left posterior coronary radiata stroke.  Assessment:  84 year old man with hypertension and diabetes with sudden onset of weakness and gait instability brought in for  evaluation after he was assessed at an outside facility and found to have a positive Romberg sign. I think his positive Romberg's is unrelated to his stroke and related to his peripheral neuropathy but he does have a stroke in the left posterior coronary radiata and would need work-up for that. Etiology most likely small vessel. Evaluate for underlying arrhythmias for cardio embolic source  Recommendations: -Telemetry monitoring -Allow for permissive hypertension for the first 24-48h - only treat PRN if SBP >220 mmHg. Blood pressures can be gradually normalized to SBP<140 upon discharge. -MRA head without contrast, MRA neck with contrast -CT Angiogram of Head and neck -Echocardiogram -HgbA1c, fasting lipid panel -Frequent neuro checks -Prophylactic therapy-Antiplatelet med: Aspirin - dose 325mg  PO or 300mg  PR -Atorvastatin 80 mg PO daily -Risk factor modification -PT consult, OT consult, Speech consult -If Afib found on telemetry, will need anticoagulation. Decision pending imaging and stroke team rounding.  Please page stroke NP/PA/MD (listed on AMION)  from 8am-4 pm as this patient will be followed by the stroke team at this point. -- Amie Portland, MD [facility] Pager: [phone] If 7pm to 7am, please call on call as listed on AMION.

## 2018-10-18 ENCOUNTER — Encounter (HOSPITAL_COMMUNITY): Payer: Medicare Other

## 2018-10-18 ENCOUNTER — Inpatient Hospital Stay (HOSPITAL_COMMUNITY): Payer: Medicare Other

## 2018-10-18 DIAGNOSIS — E7801 Familial hypercholesterolemia: Secondary | ICD-10-CM

## 2018-10-18 DIAGNOSIS — I6300 Cerebral infarction due to thrombosis of unspecified precerebral artery: Secondary | ICD-10-CM

## 2018-10-18 DIAGNOSIS — G5600 Carpal tunnel syndrome, unspecified upper limb: Secondary | ICD-10-CM

## 2018-10-18 DIAGNOSIS — E1169 Type 2 diabetes mellitus with other specified complication: Secondary | ICD-10-CM

## 2018-10-18 DIAGNOSIS — Z951 Presence of aortocoronary bypass graft: Secondary | ICD-10-CM

## 2018-10-18 DIAGNOSIS — I361 Nonrheumatic tricuspid (valve) insufficiency: Secondary | ICD-10-CM

## 2018-10-18 DIAGNOSIS — I159 Secondary hypertension, unspecified: Secondary | ICD-10-CM

## 2018-10-18 LAB — COMPREHENSIVE METABOLIC PANEL
ALBUMIN: 3.7 g/dL (ref 3.5–5.0)
ALK PHOS: 48 U/L (ref 38–126)
ALT: 18 U/L (ref 0–44)
AST: 22 U/L (ref 15–41)
Anion gap: 10 (ref 5–15)
BUN: 21 mg/dL (ref 8–23)
CALCIUM: 8.9 mg/dL (ref 8.9–10.3)
CO2: 22 mmol/L (ref 22–32)
Chloride: 109 mmol/L (ref 98–111)
Creatinine, Ser: 1.06 mg/dL (ref 0.61–1.24)
GFR calc non Af Amer: 58 mL/min — ABNORMAL LOW (ref >60)
GLUCOSE: 94 mg/dL (ref 70–99)
POTASSIUM: 3.8 mmol/L (ref 3.5–5.1)
SODIUM: 141 mmol/L (ref 135–145)
TOTAL PROTEIN: 6.3 g/dL — AB (ref 6.5–8.1)
Total Bilirubin: 0.9 mg/dL (ref 0.3–1.2)

## 2018-10-18 LAB — CBC
HCT: 43.4 % (ref 39.0–52.0)
HEMATOCRIT: 44.7 % (ref 39.0–52.0)
Hemoglobin: 13.8 g/dL (ref 13.0–17.0)
Hemoglobin: 14.4 g/dL (ref 13.0–17.0)
MCH: 30.6 pg (ref 26.0–34.0)
MCH: 30.7 pg (ref 26.0–34.0)
MCHC: 31.8 g/dL (ref 30.0–36.0)
MCHC: 32.2 g/dL (ref 30.0–36.0)
MCV: 95.1 fL (ref 80.0–100.0)
MCV: 96.7 fL (ref 80.0–100.0)
PLATELETS: 283 10*3/uL (ref 150–400)
Platelets: 281 K/uL (ref 150–400)
RBC: 4.49 MIL/uL (ref 4.22–5.81)
RBC: 4.7 MIL/uL (ref 4.22–5.81)
RDW: 14.2 % (ref 11.5–15.5)
RDW: 14.4 % (ref 11.5–15.5)
WBC: 8.1 10*3/uL (ref 4.0–10.5)
WBC: 8.1 K/uL (ref 4.0–10.5)
nRBC: 0 % (ref 0.0–0.2)
nRBC: 0 % (ref 0.0–0.2)

## 2018-10-18 LAB — GLUCOSE, CAPILLARY
GLUCOSE-CAPILLARY: 80 mg/dL (ref 70–99)
Glucose-Capillary: 120 mg/dL — ABNORMAL HIGH (ref 70–99)

## 2018-10-18 LAB — ECHOCARDIOGRAM COMPLETE
HEIGHTINCHES: 72 in
WEIGHTICAEL: 2976 [oz_av]

## 2018-10-18 LAB — HEMOGLOBIN A1C
Hgb A1c MFr Bld: 6.5 % — ABNORMAL HIGH (ref 4.8–5.6)
Mean Plasma Glucose: 139.85 mg/dL

## 2018-10-18 LAB — CBG MONITORING, ED
GLUCOSE-CAPILLARY: 109 mg/dL — AB (ref 70–99)
Glucose-Capillary: 110 mg/dL — ABNORMAL HIGH (ref 70–99)
Glucose-Capillary: 112 mg/dL — ABNORMAL HIGH (ref 70–99)

## 2018-10-18 LAB — CREATININE, SERUM
Creatinine, Ser: 1.12 mg/dL (ref 0.61–1.24)
GFR calc Af Amer: >60 mL/min (ref >60)
GFR, EST NON AFRICAN AMERICAN: 55 mL/min — AB (ref >60)

## 2018-10-18 LAB — LIPID PANEL
Cholesterol: 193 mg/dL (ref 0–200)
HDL: 39 mg/dL — ABNORMAL LOW (ref >40)
LDL Cholesterol: 132 mg/dL — ABNORMAL HIGH (ref 0–99)
Total CHOL/HDL Ratio: 4.9 ratio
Triglycerides: 112 mg/dL (ref <150)
VLDL: 22 mg/dL (ref 0–40)

## 2018-10-18 MED ORDER — ASPIRIN EC 81 MG PO TBEC
81.0000 mg | DELAYED_RELEASE_TABLET | Freq: Every day | ORAL | Status: DC
  Administered 2018-10-19: 81 mg via ORAL
  Filled 2018-10-18: qty 1

## 2018-10-18 MED ORDER — ROSUVASTATIN CALCIUM 5 MG PO TABS
5.0000 mg | ORAL_TABLET | Freq: Every day | ORAL | Status: DC
  Administered 2018-10-18: 5 mg via ORAL
  Filled 2018-10-18: qty 1

## 2018-10-18 MED ORDER — IOPAMIDOL (ISOVUE-370) INJECTION 76%
INTRAVENOUS | Status: AC
  Filled 2018-10-18: qty 50

## 2018-10-18 MED ORDER — CLOPIDOGREL BISULFATE 75 MG PO TABS
75.0000 mg | ORAL_TABLET | Freq: Every day | ORAL | Status: DC
  Administered 2018-10-18 – 2018-10-19 (×2): 75 mg via ORAL
  Filled 2018-10-18 (×2): qty 1

## 2018-10-18 MED ORDER — IOPAMIDOL (ISOVUE-370) INJECTION 76%
50.0000 mL | Freq: Once | INTRAVENOUS | Status: AC | PRN
  Administered 2018-10-18: 50 mL via INTRAVENOUS

## 2018-10-18 NOTE — ED Notes (Signed)
Meal tray given, pt alert and oriented.

## 2018-10-18 NOTE — ED Notes (Signed)
Pt using bedside commode at this time. Instructed pt on how to use the call button when he is finished.

## 2018-10-18 NOTE — Progress Notes (Signed)
  Echocardiogram 2D Echocardiogram has been performed.  Darlina Sicilian M 10/18/2018, 11:39 AM

## 2018-10-18 NOTE — ED Notes (Signed)
Pt care assumed, obtained verbal report.  Pt is resting and appears comfortable.  Denies any complaints.

## 2018-10-18 NOTE — Progress Notes (Addendum)
STROKE TEAM PROGRESS NOTE  HPI:( Dr Rory Percy )  Zachary Spence is a 84 y.o. male diabetes, hypertension, non-Hodgkin's lymphoma, presented to the emergency room for evaluation of sudden onset of dizziness and weakness along with gait disturbance. He is a very independent 84 year old who still drives, drove to the church this morning, went to Sunday school at church and walked up to the [place].  Sat back in his car, started driving to get home, stopped on the way to [employer] ate lunch and got home and was seen by family to be wobbly on his feet. He has a history of a left foot drop and uses a AFO for that as well as a cane for balance. 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 Reports compliance to medications. The son felt that he was not steady on his gait and I took him to an urgent care where they thought that he had a positive Romberg sign and sent in for further evaluation [facility].  An MRI was done because of concern for posterior circulation stroke and was positive for stroke in the posterior left corona radiata. LKW: Unclear but past 4-1/2 hours for IV TPA. tpa given?: no, outside the window Premorbid modified Rankin scale (mRS): 1  INTERVAL HISTORY He reounted HPI. His son is at the bedside in the ED. He did not notice symptoms, but his son did. He is awaiting a bed on the floor.   Vitals:   10/18/18 0800 10/18/18 1000 10/18/18 1013 10/18/18 1200  BP: (!) 115/57 136/70 136/70 131/88  Pulse: 70 76 75 64  Resp: 17 18  19   Temp:      TempSrc:      SpO2: 93% 98%  97%  Weight:      Height:        CBC:  Recent Labs  Lab 10/17/18 1813 10/18/18 0016 10/18/18 0313  WBC 7.5 8.1 8.1  NEUTROABS 3.6  --   --   HGB 14.5 14.4 13.8  HCT 47.2 44.7 43.4  MCV 97.3 95.1 96.7  PLT 312 283 326    Basic Metabolic Panel:  Recent Labs  Lab 10/17/18 1813 10/18/18 0016 10/18/18 0313  NA 140  --  141  K 4.3  --  3.8  CL 109  --  109  CO2 23  --  22   GLUCOSE 101*  --  94  BUN 24*  --  21  CREATININE 1.13 1.12 1.06  CALCIUM 9.4  --  8.9   Lipid Panel:     Component Value Date/Time   CHOL 193 10/18/2018 0313   TRIG 112 10/18/2018 0313   HDL 39 (L) 10/18/2018 0313   CHOLHDL 4.9 10/18/2018 0313   VLDL 22 10/18/2018 0313   LDLCALC 132 (H) 10/18/2018 0313   HgbA1c:  Lab Results  Component Value Date   HGBA1C 6.5 (H) 10/18/2018   Urine Drug Screen: No results found for: LABOPIA, COCAINSCRNUR, LABBENZ, AMPHETMU, THCU, LABBARB  Alcohol Level No results found for: ETH  IMAGING Ct Angio Head W Or Wo Contrast  Result Date: 10/18/2018 CLINICAL DATA:  84 y/o  M; stroke for follow-up. EXAM: CT ANGIOGRAPHY HEAD AND NECK TECHNIQUE: Multidetector CT imaging of the head and neck was performed using the standard protocol during bolus administration of intravenous contrast. Multiplanar CT image reconstructions and MIPs were obtained to evaluate the vascular anatomy. Carotid stenosis measurements (when applicable) are obtained utilizing NASCET criteria, using the distal  internal carotid diameter as the denominator. CONTRAST:  50 cc Isovue 370 COMPARISON:  10/17/2018 MRI and CT of the head. 11/18/2015 CT head. FINDINGS: CTA NECK FINDINGS Aortic arch: Bovine variant branching. Imaged portion shows no evidence of aneurysm or dissection. No significant stenosis of the major arch vessel origins. Atherosclerosis with mixed plaque. Right carotid system: No evidence of dissection, stenosis (50% or greater) or occlusion. Mild non stenotic calcific atherosclerosis of carotid bifurcation. Left carotid system: No evidence of dissection, stenosis (50% or greater) or occlusion. Vertebral arteries: Left dominant. No evidence of dissection, stenosis (50% or greater) or occlusion. Skeleton: Mild-to-moderate cervical spondylosis with multilevel disc and facet degenerative changes. No high-grade bony canal stenosis. C3-4 grade 1 anterolisthesis due to facet arthropathy.  Other neck: Right anterior parotid nodule is increased in size from 2016 measuring 18 x 14 mm (AP by ML series 6, image 84). Upper chest: Status post CABG with LIMA and saphenous grafts, incompletely visualized. Healed upper median sternotomy. Review of the MIP images confirms the above findings CTA HEAD FINDINGS Anterior circulation: No significant stenosis, proximal occlusion, aneurysm, or vascular malformation. Mild non stenotic calcific atherosclerosis of carotid siphons. Posterior circulation: No significant stenosis, proximal occlusion, aneurysm, or vascular malformation. Mild right V4 stenosis. Venous sinuses: As permitted by contrast timing, patent. Anatomic variants: Complete circle-of-Willis. Fetal right PCA. Delayed phase: No abnormal intracranial enhancement. Review of the MIP images confirms the above findings IMPRESSION: 1. Patent carotid and vertebral arteries. No dissection, aneurysm, or hemodynamically significant stenosis utilizing NASCET criteria. 2. Patent anterior and posterior intracranial circulation. No large vessel occlusion, aneurysm, or significant stenosis. 3. Right anterior parotid nodule is increased in size from 2016. 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 If clinically indicated, consider MRI of the face with and without contrast for further evaluation and possible surgical consultation on a nonemergent basis. Electronically Signed   By: Kristine Garbe M.D.   On: 10/18/2018 01:31   Ct Head Wo Contrast  Result Date: 10/17/2018 CLINICAL DATA:  Unsteady gait. Leg weakness. Symptoms since this morning. EXAM: CT HEAD WITHOUT CONTRAST TECHNIQUE: Contiguous axial images were obtained from the base of the skull through the vertex without intravenous contrast. COMPARISON:  11/18/2015 FINDINGS: Brain: No evidence of acute infarction, hemorrhage, hydrocephalus, extra-axial collection  or mass lesion/mass effect. There is mild ventricular and sulcal enlargement reflecting age-appropriate volume loss. Small deep white matter lacunar infarct in the left parietal lobe. Small old lacunar infarct in the left caudate nucleus head. Small old lacunar infarct, right cerebellum. Vascular: No hyperdense vessel or unexpected calcification. Skull: Normal. Negative for fracture or focal lesion. Sinuses/Orbits: Globes and orbits are unremarkable. The visualized sinuses and mastoid air cells are clear. Other: None. IMPRESSION: 1. No acute intracranial abnormalities. 2. Age-appropriate volume loss.  Three small old lacunar infarcts. Electronically Signed   By: Lajean Manes M.D.   On: 10/17/2018 18:39   Ct Angio Neck W Or Wo Contrast  Result Date: 10/18/2018 CLINICAL DATA:  84 y/o  M; stroke for follow-up. EXAM: CT ANGIOGRAPHY HEAD AND NECK TECHNIQUE: Multidetector CT imaging of the head and neck was performed using the standard protocol during bolus administration of intravenous contrast. Multiplanar CT image reconstructions and MIPs were obtained to evaluate the vascular anatomy. Carotid stenosis measurements (when applicable) are obtained utilizing NASCET criteria, using the distal internal carotid diameter as the denominator. CONTRAST:  50 cc Isovue 370 COMPARISON:  10/17/2018 MRI  and CT of the head. 11/18/2015 CT head. FINDINGS: CTA NECK FINDINGS Aortic arch: Bovine variant branching. Imaged portion shows no evidence of aneurysm or dissection. No significant stenosis of the major arch vessel origins. Atherosclerosis with mixed plaque. Right carotid system: No evidence of dissection, stenosis (50% or greater) or occlusion. Mild non stenotic calcific atherosclerosis of carotid bifurcation. Left carotid system: No evidence of dissection, stenosis (50% or greater) or occlusion. Vertebral arteries: Left dominant. No evidence of dissection, stenosis (50% or greater) or occlusion. Skeleton: Mild-to-moderate  cervical spondylosis with multilevel disc and facet degenerative changes. No high-grade bony canal stenosis. C3-4 grade 1 anterolisthesis due to facet arthropathy. Other neck: Right anterior parotid nodule is increased in size from 2016 measuring 18 x 14 mm (AP by ML series 6, image 84). Upper chest: Status post CABG with LIMA and saphenous grafts, incompletely visualized. Healed upper median sternotomy. Review of the MIP images confirms the above findings CTA HEAD FINDINGS Anterior circulation: No significant stenosis, proximal occlusion, aneurysm, or vascular malformation. Mild non stenotic calcific atherosclerosis of carotid siphons. Posterior circulation: No significant stenosis, proximal occlusion, aneurysm, or vascular malformation. Mild right V4 stenosis. Venous sinuses: As permitted by contrast timing, patent. Anatomic variants: Complete circle-of-Willis. Fetal right PCA. Delayed phase: No abnormal intracranial enhancement. Review of the MIP images confirms the above findings IMPRESSION: 1. Patent carotid and vertebral arteries. No dissection, aneurysm, or hemodynamically significant stenosis utilizing NASCET criteria. 2. Patent anterior and posterior intracranial circulation. No large vessel occlusion, aneurysm, or significant stenosis. 3. Right anterior parotid nodule is increased in size from 2016. 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 If clinically indicated, consider MRI of the face with and without contrast for further evaluation and possible surgical consultation on a nonemergent basis. Electronically Signed   By: Kristine Garbe M.D.   On: 10/18/2018 01:31   Mr Brain Wo Contrast  Result Date: 10/17/2018 CLINICAL DATA:  Unsteady gait and lower extremity weakness. EXAM: MRI HEAD WITHOUT CONTRAST TECHNIQUE: Multiplanar, multiecho pulse sequences of the brain and surrounding structures were  obtained without intravenous contrast. COMPARISON:  Head CT 10/17/2018 FINDINGS: BRAIN: Focus of abnormal diffusion restriction within the posterior left corona radiata. No other diffusion abnormality. The midline structures are normal. No midline shift or other mass effect. Old right cerebellar infarct. Old left caudate and left corona radiata infarcts. Mild periventricular white matter hyperintensity. Generalized atrophy without lobar predilection. Susceptibility-sensitive sequences show no chronic microhemorrhage or superficial siderosis. VASCULAR: Major intracranial arterial and venous sinus flow voids are normal. SKULL AND UPPER CERVICAL SPINE: Calvarial bone marrow signal is normal. There is no skull base mass. Visualized upper cervical spine and soft tissues are normal. SINUSES/ORBITS: No fluid levels or advanced mucosal thickening. No mastoid or middle ear effusion. The orbits are normal. IMPRESSION: 1. Small focus of acute ischemia within the posterior left corona radiata. No hemorrhage or mass effect. 2. Findings of chronic small vessel ischemia, including multiple old lacunar infarcts. Electronically Signed   By: Ulyses Jarred M.D.   On: 10/17/2018 21:17    PHYSICAL EXAM Pleasant elderly Caucasian male not in distress. . Afebrile. Head is nontraumatic. Neck is supple without bruit.    Cardiac exam no murmur or gallop. Lungs are clear to auscultation. Distal pulses are well felt. Neurological Exam ;  Awake  Alert oriented x 3. Normal speech and language.eye movements full without nystagmus.fundi were not visualized. Vision acuity and fields  appear normal. Hearing is normal. Palatal movements are normal. Face symmetric. Tongue midline. Normal strength, tone, reflexes and coordination except left foot drop which is old. Normal sensation except in the left lower extremity but it is diminished.. Gait deferred.   ASSESSMENT/PLAN Zachary Spence is a 84 y.o. male with history of HTN, HLD, DB,   epistaxis on aspirin, NHL presenting with dizziness, weakness and gait instability.   Stroke:  small left corona radiata infarct  Lacunar secondary to small vessel disease    CT head No acute abnormality. 3 old lacunes. age appropriate atrophy.      MRI  Small L CR infarct. Small vessel disease. Mult old lacunes.  CTA head & neck R anterior parotid node increased in size from 2016  2D Echo  pending   LDL 132  HgbA1c 6.5  Heparin 5000 units sq tid for VTE prophylaxis  No antithrombotic prior to admission due to spontaneous epistaxis, now on aspirin 325 mg daily. Given mild stroke, recommend aspirin 81 mg and plavix 75 mg daily x 3 weeks, then PLAVIX alone. Orders adjusted.   Therapy recommendations:  CIR  Disposition:  pending   Hypertension  Stable . Permissive hypertension (OK if < 220/120) but gradually normalize in 5-7 days . Long-term BP goal normotensive  Hyperlipidemia  Home meds:  zocor 20 every other day and fish oil, resumed in hospital  Decreased dose zocor to every other day due to arm pain at home  Will change zocor to crestor 5  LDL 132, goal < 70  Recommend the addition of CoQ10 200 mg at home to help with myalgias  Continue statin at discharge  Diabetes type II  HgbA1c 6.5, goal < 7.0  Controlled  Other Stroke Risk Factors  Advanced age  CAD s/p CABG  Hospital day # [place], MSN, APRN, ANVP-BC, AGPCNP-BC Advanced Practice Stroke Nurse [place] for Schedule & Pager information 10/18/2018 1:36 PM  I have personally examined this patient, reviewed notes, independently viewed imaging studies, participated in medical decision making and plan of care.ROS completed by me personally and pertinent positives fully documented  I have made any additions or clarifications directly to the above note. Agree with note above. He has presented with slurred speech secondary to small lacunar infarct likely from small vessel  disease. Continue ongoing stroke workup and aggressive risk factor modification. Dual antiplatelet therapy for 3 weeks followed by Plavix alone. Long discussion the patient and son at the bedside and answered questions about his care. Greater than 50% time during this 35 minute visit was spent on counseling and coordination of care but is lacunar infarct in discussion about evaluation and treatment plan and answering questions.  Antony Contras, MD Medical Director [facility] Pager: [phone] 10/18/2018 6:58 PM  To contact Stroke Continuity provider, please refer to [email or web address] After hours, contact General Neurology

## 2018-10-18 NOTE — Progress Notes (Signed)
PROGRESS NOTE    Zachary Spence  TGG:[id number] DOB: Jun 23, 1923 DOA: 10/17/2018 PCP: Tracie Harrier, MD   Brief Narrative:  84 year old with a history of coronary artery disease status post bypass in 1997, essential hypertension, non-Hodgkin's lymphoma in remission, diabetes mellitus type 2 came to the hospital with complains of sudden onset of dizziness and weakness.  Patient had a CT of the head in the ER which was negative for acute finding but MRI showed small acute ischemic stroke and posterior left corona radiata.  Neurology was consulted.  Patient was admitted for routine work-up.   Assessment & Plan:   Principal Problem:   CVA (cerebral vascular accident) ([place]) Active Problems:   Carpal tunnel syndrome   Diabetes mellitus (HCC)   BP (high blood pressure)   HLD (hyperlipidemia)   H/O coronary artery bypass surgery   Acute CVA and left posterior coronary radiata Dizziness -Stroke protocol -Allow for permissive hypertension for the first 24-48h - only treat PRN if SBP >220 mmHg. Blood pressures can be gradually normalized to SBP<140 upon discharge. -MRI brain without contrast -left posterior coronary radiata CVA -Maintain Euthermia.  -Aspirin 3 and 25 mg orally daily, Zocor 20 mg.  Will need to be increased -Echocardiogram-results pending -Lipid Panel-LDL 132, and A1C-6.5 -Frequent neuro checks -Atorvastatin PO within 24 hrs.  -Risk factor modification -Consult Neurology -PT/OT eval-pending -Normal saline 100 cc/h  Coronary artery disease status post CABG -Currently patient is chest pain-free, continue home aspirin and statin.  Blood pressure medications on hold due to permissive hypertension.  Diabetes mellitus type 2 -Continue glipizide, insulin sliding scale and Accu-Chek  Hyperlipidemia -LDL elevated 132, on Zocor 20 mg daily, previously he has had upper extremity rash and unusual feeling therefore he only takes it 3 times weekly.  We will need to adjust  this, defer to neurology.  Essential hypertension -We will discontinue home blood pressure medicines at this time.   DVT prophylaxis: Heparin Code Status: Full code Family Communication: None at bedside Disposition Plan: Maintain inpatient stay for further evaluation and work-up for acute CVA  Consultants:   Neurology  Procedures:   None  Antimicrobials:   None   Subjective: Still reports of dizziness.  He has not tried ambulating yet.  No other complaints.  Review of Systems Otherwise negative except as per HPI, including: General: Denies fever, chills, night sweats or unintended weight loss. Resp: Denies cough, wheezing, shortness of breath. Cardiac: Denies chest pain, palpitations, orthopnea, paroxysmal nocturnal dyspnea. GI: Denies abdominal pain, nausea, vomiting, diarrhea or constipation GU: Denies dysuria, frequency, hesitancy or incontinence MS: Denies muscle aches, joint pain or swelling Neuro: Denies headache, neurologic deficits (focal weakness, numbness, tingling), abnormal gait Psych: Denies anxiety, depression, SI/HI/AVH Skin: Denies new rashes or lesions ID: Denies sick contacts, exotic exposures, travel  Objective: Vitals:   10/18/18 0800 10/18/18 1000 10/18/18 1013 10/18/18 1200  BP: (!) 115/57 136/70 136/70 131/88  Pulse: 70 76 75 64  Resp: 17 18  19   Temp:      TempSrc:      SpO2: 93% 98%  97%  Weight:      Height:       No intake or output data in the 24 hours ending 10/18/18 1223 Filed Weights   10/17/18 1759  Weight: 84.4 kg    Examination:  General exam: Appears calm and comfortable  Respiratory system: Clear to auscultation. Respiratory effort normal. Cardiovascular system: S1 & S2 heard, RRR. No JVD, murmurs, rubs, gallops or clicks. No pedal  edema. Gastrointestinal system: Abdomen is nondistended, soft and nontender. No organomegaly or masses felt. Normal bowel sounds heard. Central nervous system: Alert and oriented. No focal  neurological deficits. Extremities: Symmetric 5 x 5 power. Skin: No rashes, lesions or ulcers Psychiatry: Judgement and insight appear normal. Mood & affect appropriate.     Data Reviewed:   CBC: Recent Labs  Lab 10/17/18 1813 10/18/18 0016 10/18/18 0313  WBC 7.5 8.1 8.1  NEUTROABS 3.6  --   --   HGB 14.5 14.4 13.8  HCT 47.2 44.7 43.4  MCV 97.3 95.1 96.7  PLT 312 283 269   Basic Metabolic Panel: Recent Labs  Lab 10/17/18 1813 10/18/18 0016 10/18/18 0313  NA 140  --  141  K 4.3  --  3.8  CL 109  --  109  CO2 23  --  22  GLUCOSE 101*  --  94  BUN 24*  --  21  CREATININE 1.13 1.12 1.06  CALCIUM 9.4  --  8.9   GFR: Estimated Creatinine Clearance: 47.8 mL/min (by C-G formula based on SCr of 1.06 mg/dL). Liver Function Tests: Recent Labs  Lab 10/17/18 1813 10/18/18 0313  AST 25 22  ALT 21 18  ALKPHOS 53 48  BILITOT 0.6 0.9  PROT 7.1 6.3*  ALBUMIN 4.0 3.7   No results for input(s): LIPASE, AMYLASE in the last 168 hours. No results for input(s): AMMONIA in the last 168 hours. Coagulation Profile: Recent Labs  Lab 10/17/18 1813  INR 1.00   Cardiac Enzymes: No results for input(s): CKTOTAL, CKMB, CKMBINDEX, TROPONINI in the last 168 hours. BNP (last 3 results) No results for input(s): PROBNP in the last 8760 hours. HbA1C: Recent Labs    10/18/18 0313  HGBA1C 6.5*   CBG: Recent Labs  Lab 10/18/18 0010 10/18/18 0745 10/18/18 1146  GLUCAP 110* 112* 109*   Lipid Profile: Recent Labs    10/18/18 0313  CHOL 193  HDL 39*  LDLCALC 132*  TRIG 112  CHOLHDL 4.9   Thyroid Function Tests: No results for input(s): TSH, T4TOTAL, FREET4, T3FREE, THYROIDAB in the last 72 hours. Anemia Panel: No results for input(s): VITAMINB12, FOLATE, FERRITIN, TIBC, IRON, RETICCTPCT in the last 72 hours. Sepsis Labs: No results for input(s): PROCALCITON, LATICACIDVEN in the last 168 hours.  No results found for this or any previous visit (from the past 240  hour(s)).       Radiology Studies: Ct Angio Head W Or Wo Contrast  Result Date: 10/18/2018 CLINICAL DATA:  84 y/o  M; stroke for follow-up. EXAM: CT ANGIOGRAPHY HEAD AND NECK TECHNIQUE: Multidetector CT imaging of the head and neck was performed using the standard protocol during bolus administration of intravenous contrast. Multiplanar CT image reconstructions and MIPs were obtained to evaluate the vascular anatomy. Carotid stenosis measurements (when applicable) are obtained utilizing NASCET criteria, using the distal internal carotid diameter as the denominator. CONTRAST:  50 cc Isovue 370 COMPARISON:  10/17/2018 MRI and CT of the head. 11/18/2015 CT head. FINDINGS: CTA NECK FINDINGS Aortic arch: Bovine variant branching. Imaged portion shows no evidence of aneurysm or dissection. No significant stenosis of the major arch vessel origins. Atherosclerosis with mixed plaque. Right carotid system: No evidence of dissection, stenosis (50% or greater) or occlusion. Mild non stenotic calcific atherosclerosis of carotid bifurcation. Left carotid system: No evidence of dissection, stenosis (50% or greater) or occlusion. Vertebral arteries: Left dominant. No evidence of dissection, stenosis (50% or greater) or occlusion. Skeleton: Mild-to-moderate cervical spondylosis with  multilevel disc and facet degenerative changes. No high-grade bony canal stenosis. C3-4 grade 1 anterolisthesis due to facet arthropathy. Other neck: Right anterior parotid nodule is increased in size from 2016 measuring 18 x 14 mm (AP by ML series 6, image 84). Upper chest: Status post CABG with LIMA and saphenous grafts, incompletely visualized. Healed upper median sternotomy. Review of the MIP images confirms the above findings CTA HEAD FINDINGS Anterior circulation: No significant stenosis, proximal occlusion, aneurysm, or vascular malformation. Mild non stenotic calcific atherosclerosis of carotid siphons. Posterior circulation: No  significant stenosis, proximal occlusion, aneurysm, or vascular malformation. Mild right V4 stenosis. Venous sinuses: As permitted by contrast timing, patent. Anatomic variants: Complete circle-of-Willis. Fetal right PCA. Delayed phase: No abnormal intracranial enhancement. Review of the MIP images confirms the above findings IMPRESSION: 1. Patent carotid and vertebral arteries. No dissection, aneurysm, or hemodynamically significant stenosis utilizing NASCET criteria. 2. Patent anterior and posterior intracranial circulation. No large vessel occlusion, aneurysm, or significant stenosis. 3. Right anterior parotid nodule is increased in size from 2016. 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 If clinically indicated, consider MRI of the face with and without contrast for further evaluation and possible surgical consultation on a nonemergent basis. Electronically Signed   By: Kristine Garbe M.D.   On: 10/18/2018 01:31   Ct Head Wo Contrast  Result Date: 10/17/2018 CLINICAL DATA:  Unsteady gait. Leg weakness. Symptoms since this morning. EXAM: CT HEAD WITHOUT CONTRAST TECHNIQUE: Contiguous axial images were obtained from the base of the skull through the vertex without intravenous contrast. COMPARISON:  11/18/2015 FINDINGS: Brain: No evidence of acute infarction, hemorrhage, hydrocephalus, extra-axial collection or mass lesion/mass effect. There is mild ventricular and sulcal enlargement reflecting age-appropriate volume loss. Small deep white matter lacunar infarct in the left parietal lobe. Small old lacunar infarct in the left caudate nucleus head. Small old lacunar infarct, right cerebellum. Vascular: No hyperdense vessel or unexpected calcification. Skull: Normal. Negative for fracture or focal lesion. Sinuses/Orbits: Globes and orbits are unremarkable. The visualized sinuses and mastoid air cells are  clear. Other: None. IMPRESSION: 1. No acute intracranial abnormalities. 2. Age-appropriate volume loss.  Three small old lacunar infarcts. Electronically Signed   By: Lajean Manes M.D.   On: 10/17/2018 18:39   Ct Angio Neck W Or Wo Contrast  Result Date: 10/18/2018 CLINICAL DATA:  84 y/o  M; stroke for follow-up. EXAM: CT ANGIOGRAPHY HEAD AND NECK TECHNIQUE: Multidetector CT imaging of the head and neck was performed using the standard protocol during bolus administration of intravenous contrast. Multiplanar CT image reconstructions and MIPs were obtained to evaluate the vascular anatomy. Carotid stenosis measurements (when applicable) are obtained utilizing NASCET criteria, using the distal internal carotid diameter as the denominator. CONTRAST:  50 cc Isovue 370 COMPARISON:  10/17/2018 MRI and CT of the head. 11/18/2015 CT head. FINDINGS: CTA NECK FINDINGS Aortic arch: Bovine variant branching. Imaged portion shows no evidence of aneurysm or dissection. No significant stenosis of the major arch vessel origins. Atherosclerosis with mixed plaque. Right carotid system: No evidence of dissection, stenosis (50% or greater) or occlusion. Mild non stenotic calcific atherosclerosis of carotid bifurcation. Left carotid system: No evidence of dissection, stenosis (50% or greater) or occlusion. Vertebral arteries: Left dominant. No evidence of dissection, stenosis (50% or greater) or occlusion. Skeleton: Mild-to-moderate cervical spondylosis with multilevel disc and facet degenerative changes. No high-grade bony canal stenosis. C3-4 grade 1 anterolisthesis due  to facet arthropathy. Other neck: Right anterior parotid nodule is increased in size from 2016 measuring 18 x 14 mm (AP by ML series 6, image 84). Upper chest: Status post CABG with LIMA and saphenous grafts, incompletely visualized. Healed upper median sternotomy. Review of the MIP images confirms the above findings CTA HEAD FINDINGS Anterior circulation: No  significant stenosis, proximal occlusion, aneurysm, or vascular malformation. Mild non stenotic calcific atherosclerosis of carotid siphons. Posterior circulation: No significant stenosis, proximal occlusion, aneurysm, or vascular malformation. Mild right V4 stenosis. Venous sinuses: As permitted by contrast timing, patent. Anatomic variants: Complete circle-of-Willis. Fetal right PCA. Delayed phase: No abnormal intracranial enhancement. Review of the MIP images confirms the above findings IMPRESSION: 1. Patent carotid and vertebral arteries. No dissection, aneurysm, or hemodynamically significant stenosis utilizing NASCET criteria. 2. Patent anterior and posterior intracranial circulation. No large vessel occlusion, aneurysm, or significant stenosis. 3. Right anterior parotid nodule is increased in size from 2016. 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 If clinically indicated, consider MRI of the face with and without contrast for further evaluation and possible surgical consultation on a nonemergent basis. Electronically Signed   By: Kristine Garbe M.D.   On: 10/18/2018 01:31   Mr Brain Wo Contrast  Result Date: 10/17/2018 CLINICAL DATA:  Unsteady gait and lower extremity weakness. EXAM: MRI HEAD WITHOUT CONTRAST TECHNIQUE: Multiplanar, multiecho pulse sequences of the brain and surrounding structures were obtained without intravenous contrast. COMPARISON:  Head CT 10/17/2018 FINDINGS: BRAIN: Focus of abnormal diffusion restriction within the posterior left corona radiata. No other diffusion abnormality. The midline structures are normal. No midline shift or other mass effect. Old right cerebellar infarct. Old left caudate and left corona radiata infarcts. Mild periventricular white matter hyperintensity. Generalized atrophy without lobar predilection. Susceptibility-sensitive sequences show no chronic  microhemorrhage or superficial siderosis. VASCULAR: Major intracranial arterial and venous sinus flow voids are normal. SKULL AND UPPER CERVICAL SPINE: Calvarial bone marrow signal is normal. There is no skull base mass. Visualized upper cervical spine and soft tissues are normal. SINUSES/ORBITS: No fluid levels or advanced mucosal thickening. No mastoid or middle ear effusion. The orbits are normal. IMPRESSION: 1. Small focus of acute ischemia within the posterior left corona radiata. No hemorrhage or mass effect. 2. Findings of chronic small vessel ischemia, including multiple old lacunar infarcts. Electronically Signed   By: Ulyses Jarred M.D.   On: 10/17/2018 21:17        Scheduled Meds: .  stroke: mapping our early stages of recovery book   Does not apply Once  . aspirin  325 mg Oral Daily  . atenolol  25 mg Oral Daily  . calcium carbonate  1,250 mg Oral Q breakfast  . folic acid  1 mg Oral Daily  . glipiZIDE  5 mg Oral Q breakfast  . heparin  5,000 Units Subcutaneous Q8H  . insulin aspart  0-5 Units Subcutaneous QHS  . insulin aspart  0-9 Units Subcutaneous TID WC  . multivitamin  1 tablet Oral Daily  . multivitamin with minerals  1 tablet Oral Daily  . omega-3 acid ethyl esters  1 g Oral Daily  . [START ON 10/19/2018] simvastatin  20 mg Oral Q T,Th,Sat-1800   Continuous Infusions: . sodium chloride 100 mL/hr at 10/18/18 0121     LOS: 1 day   Time spent= 30 mins    Carlyle Achenbach Arsenio Loader, MD [facility] Pager [phone]  If 7PM-7AM, please contact night-coverage [email or web address] Password [facility] 10/18/2018, 12:23 PM

## 2018-10-18 NOTE — ED Notes (Signed)
Family at bedside. 

## 2018-10-18 NOTE — Progress Notes (Signed)
Rehab Admissions Coordinator Note:  Patient was screened by Cleatrice Burke for appropriateness for an Inpatient Acute Rehab Consult per PT recommendation. .  At this time, we are recommending Inpatient Rehab consult. I will contact Dr. Reesa Chew for order.  Cleatrice Burke 10/18/2018, 2:38 PM  I can be reached at [phone].

## 2018-10-18 NOTE — ED Notes (Signed)
Admitting MD at bedside.

## 2018-10-18 NOTE — ED Notes (Signed)
Echo at bedside

## 2018-10-18 NOTE — Evaluation (Signed)
Physical Therapy Evaluation Patient Details Name: Zachary Spence MRN: [id number] DOB: [birth year] Today's Date: 10/18/2018   History of Present Illness  PT is a 84 y/o male admitted secondary to weakness and pt reported "wobbling" during ambulation. 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 PMH includes DM, HTN, nonhodgkins lymphoma, and L foot drop at baseline.   Clinical Impression  Pt admitted secondary to problem above with deficits below. Pt requiring mod A to take side steps at EOB using cane. Presenting with weakness on the L (baseline) and decreased coordination and weakness on the R during mobility tasks. Feel pt would benefit from CIR level therapies as pt was very independent prior to admission; was still driving and ambulating with cane. Pt also with good family support from son at home. Will continue to follow acutely to maximize functional mobility independence and safety.     Follow Up Recommendations CIR;Supervision for mobility/OOB    Equipment Recommendations  None recommended by PT    Recommendations for Other Services Rehab consult     Precautions / Restrictions Precautions Precautions: Fall Required Braces or Orthoses: Other Brace/Splint Other Brace/Splint: Pt wears L AFO at baseline.  Restrictions Weight Bearing Restrictions: No      Mobility  Bed Mobility Overal bed mobility: Needs Assistance Bed Mobility: Supine to Sit;Sit to Supine     Supine to sit: Supervision Sit to supine: Supervision   General bed mobility comments: Supervision and increased time to perform bed mobility tasks.   Transfers Overall transfer level: Needs assistance Equipment used: Straight cane Transfers: Sit to/from Stand Sit to Stand: Mod assist         General transfer comment: Mod A for lift assist and steadying to come to standing.   Ambulation/Gait Ambulation/Gait assistance: Mod assist   Assistive device: Straight cane   Gait velocity: Decreased     General Gait Details: Unsteady when performing side steps at EOB. Noted decreased coordination and required mod A for steadying assist.   Stairs            Wheelchair Mobility    Modified Rankin (Stroke Patients Only) Modified Rankin (Stroke Patients Only) Pre-Morbid Rankin Score: No significant disability Modified Rankin: Moderately severe disability     Balance Overall balance assessment: Needs assistance Sitting-balance support: No upper extremity supported;Feet supported Sitting balance-Leahy Scale: Good     Standing balance support: Single extremity supported;During functional activity Standing balance-Leahy Scale: Poor Standing balance comment: Reliant on RUE and external support.                              Pertinent Vitals/Pain Pain Assessment: No/denies pain    Home Living Family/patient expects to be discharged to:: Private residence Living Arrangements: Children Available Help at Discharge: Family;Available PRN/intermittently Type of Home: House Home Access: Stairs to enter Entrance Stairs-Rails: None Entrance Stairs-Number of Steps: 1 Home Layout: One level Home Equipment: Cane - single point;Shower seat - built in;Grab bars - tub/shower      Prior Function Level of Independence: Independent with assistive device(s)         Comments: Pt uses cane and L AFO for ambulation. Reports he was still driving as well.      Hand Dominance        Extremity/Trunk Assessment   Upper Extremity Assessment Upper Extremity Assessment: Defer to OT evaluation    Lower Extremity Assessment Lower Extremity Assessment: RLE deficits/detail;LLE deficits/detail RLE Deficits / Details: Decreased coordination  noted with heel to shin test. Functional weakness noted during sidestepping at EOB.  LLE Deficits / Details: L foot drop at baseline. WEakness in LLE at baseline.     Cervical / Trunk Assessment Cervical / Trunk Assessment: Kyphotic   Communication   Communication: HOH  Cognition Arousal/Alertness: Awake/alert Behavior During Therapy: WFL for tasks assessed/performed Overall Cognitive Status: Within Functional Limits for tasks assessed                                        General Comments      Exercises     Assessment/Plan    PT Assessment Patient needs continued PT services  PT Problem List Decreased strength;Decreased balance;Decreased mobility;Decreased coordination;Decreased knowledge of use of DME;Decreased knowledge of precautions       PT Treatment Interventions DME instruction;Gait training;Functional mobility training;Therapeutic activities;Therapeutic exercise;Balance training;Stair training;Patient/family education;Neuromuscular re-education    PT Goals (Current goals can be found in the Care Plan section)  Acute Rehab PT Goals Patient Stated Goal: to regain independence PT Goal Formulation: With patient Time For Goal Achievement: 11/01/18 Potential to Achieve Goals: Good    Frequency Min 4X/week   Barriers to discharge        Co-evaluation               AM-PAC PT "6 Clicks" Daily Activity  Outcome Measure Difficulty turning over in bed (including adjusting bedclothes, sheets and blankets)?: A Little Difficulty moving from lying on back to sitting on the side of the bed? : A Lot Difficulty sitting down on and standing up from a chair with arms (e.g., wheelchair, bedside commode, etc,.)?: Unable Help needed moving to and from a bed to chair (including a wheelchair)?: A Lot Help needed walking in hospital room?: A Lot Help needed climbing 3-5 steps with a railing? : Total 6 Click Score: 11    End of Session Equipment Utilized During Treatment: Gait belt Activity Tolerance: Patient tolerated treatment well Patient left: in bed;with call bell/phone within reach Nurse Communication: Mobility status PT Visit Diagnosis: Unsteadiness on feet (R26.81);Muscle weakness  (generalized) (M62.81)    Time: 4098-1191 PT Time Calculation (min) (ACUTE ONLY): 16 min   Charges:   PT Evaluation $PT Eval Moderate Complexity: [place], PT, DPT  Acute Rehabilitation Services  Pager: [phone] Office: [phone]   Rudean Hitt 10/18/2018, 1:16 PM

## 2018-10-18 NOTE — ED Notes (Signed)
CBG 112, notified [employer], corporate)

## 2018-10-18 NOTE — ED Notes (Signed)
Checked CBG 110, RN Katie informed

## 2018-10-19 ENCOUNTER — Other Ambulatory Visit: Payer: Self-pay

## 2018-10-19 ENCOUNTER — Encounter (HOSPITAL_COMMUNITY): Payer: Self-pay | Admitting: *Deleted

## 2018-10-19 ENCOUNTER — Inpatient Hospital Stay (HOSPITAL_COMMUNITY)
Admission: RE | Admit: 2018-10-19 | Discharge: 2018-11-03 | DRG: 057 | Disposition: A | Discharge status: Home Health | Payer: Medicare Other | Source: Intra-hospital | Attending: Physical Medicine & Rehabilitation | Admitting: Physical Medicine & Rehabilitation

## 2018-10-19 DIAGNOSIS — Z881 Allergy status to other antibiotic agents status: Secondary | ICD-10-CM

## 2018-10-19 DIAGNOSIS — R2689 Other abnormalities of gait and mobility: Secondary | ICD-10-CM | POA: Diagnosis present

## 2018-10-19 DIAGNOSIS — D591 Autoimmune hemolytic anemia, unspecified: Secondary | ICD-10-CM

## 2018-10-19 DIAGNOSIS — M21372 Foot drop, left foot: Secondary | ICD-10-CM | POA: Diagnosis present

## 2018-10-19 DIAGNOSIS — Z79899 Other long term (current) drug therapy: Secondary | ICD-10-CM | POA: Diagnosis not present

## 2018-10-19 DIAGNOSIS — Z8572 Personal history of non-Hodgkin lymphomas: Secondary | ICD-10-CM

## 2018-10-19 DIAGNOSIS — S80812A Abrasion, left lower leg, initial encounter: Secondary | ICD-10-CM | POA: Diagnosis present

## 2018-10-19 DIAGNOSIS — R531 Weakness: Secondary | ICD-10-CM | POA: Diagnosis present

## 2018-10-19 DIAGNOSIS — Z951 Presence of aortocoronary bypass graft: Secondary | ICD-10-CM

## 2018-10-19 DIAGNOSIS — E1142 Type 2 diabetes mellitus with diabetic polyneuropathy: Secondary | ICD-10-CM | POA: Diagnosis present

## 2018-10-19 DIAGNOSIS — X58XXXA Exposure to other specified factors, initial encounter: Secondary | ICD-10-CM | POA: Diagnosis present

## 2018-10-19 DIAGNOSIS — S80811A Abrasion, right lower leg, initial encounter: Secondary | ICD-10-CM | POA: Diagnosis present

## 2018-10-19 DIAGNOSIS — I6930 Unspecified sequelae of cerebral infarction: Secondary | ICD-10-CM

## 2018-10-19 DIAGNOSIS — E785 Hyperlipidemia, unspecified: Secondary | ICD-10-CM | POA: Diagnosis present

## 2018-10-19 DIAGNOSIS — K5900 Constipation, unspecified: Secondary | ICD-10-CM | POA: Diagnosis present

## 2018-10-19 DIAGNOSIS — I739 Peripheral vascular disease, unspecified: Secondary | ICD-10-CM | POA: Diagnosis not present

## 2018-10-19 DIAGNOSIS — K5901 Slow transit constipation: Secondary | ICD-10-CM

## 2018-10-19 DIAGNOSIS — I2510 Atherosclerotic heart disease of native coronary artery without angina pectoris: Secondary | ICD-10-CM | POA: Diagnosis present

## 2018-10-19 DIAGNOSIS — R2681 Unsteadiness on feet: Secondary | ICD-10-CM | POA: Diagnosis present

## 2018-10-19 DIAGNOSIS — Z791 Long term (current) use of non-steroidal anti-inflammatories (NSAID): Secondary | ICD-10-CM | POA: Diagnosis not present

## 2018-10-19 DIAGNOSIS — I10 Essential (primary) hypertension: Secondary | ICD-10-CM

## 2018-10-19 DIAGNOSIS — R269 Unspecified abnormalities of gait and mobility: Secondary | ICD-10-CM

## 2018-10-19 DIAGNOSIS — E1151 Type 2 diabetes mellitus with diabetic peripheral angiopathy without gangrene: Secondary | ICD-10-CM | POA: Diagnosis present

## 2018-10-19 DIAGNOSIS — Z87891 Personal history of nicotine dependence: Secondary | ICD-10-CM

## 2018-10-19 DIAGNOSIS — I69398 Other sequelae of cerebral infarction: Secondary | ICD-10-CM | POA: Diagnosis present

## 2018-10-19 DIAGNOSIS — Z9081 Acquired absence of spleen: Secondary | ICD-10-CM | POA: Diagnosis not present

## 2018-10-19 DIAGNOSIS — N182 Chronic kidney disease, stage 2 (mild): Secondary | ICD-10-CM

## 2018-10-19 LAB — GLUCOSE, CAPILLARY
GLUCOSE-CAPILLARY: 109 mg/dL — AB (ref 70–99)
GLUCOSE-CAPILLARY: 81 mg/dL (ref 70–99)
Glucose-Capillary: 103 mg/dL — ABNORMAL HIGH (ref 70–99)
Glucose-Capillary: 132 mg/dL — ABNORMAL HIGH (ref 70–99)

## 2018-10-19 MED ORDER — ROSUVASTATIN CALCIUM 5 MG PO TABS: 5.0000 mg | ORAL_TABLET | Freq: Every day | ORAL | 0 refills | Status: DC

## 2018-10-19 MED ORDER — ASPIRIN 81 MG PO TBEC: 81.0000 mg | DELAYED_RELEASE_TABLET | Freq: Every day | ORAL | 0 refills | Status: AC

## 2018-10-19 MED ORDER — PROSIGHT PO TABS
1.0000 | ORAL_TABLET | Freq: Every day | ORAL | Status: DC
  Administered 2018-10-20 – 2018-11-03 (×14): 1 via ORAL
  Filled 2018-10-19 (×16): qty 1

## 2018-10-19 MED ORDER — GLIPIZIDE 5 MG PO TABS
5.0000 mg | ORAL_TABLET | Freq: Every day | ORAL | Status: DC
  Administered 2018-10-20 – 2018-10-28 (×9): 5 mg via ORAL
  Filled 2018-10-19 (×9): qty 1

## 2018-10-19 MED ORDER — ASPIRIN EC 81 MG PO TBEC
81.0000 mg | DELAYED_RELEASE_TABLET | Freq: Every day | ORAL | Status: DC
  Administered 2018-10-20 – 2018-11-03 (×15): 81 mg via ORAL
  Filled 2018-10-19 (×15): qty 1

## 2018-10-19 MED ORDER — ROSUVASTATIN CALCIUM 5 MG PO TABS
5.0000 mg | ORAL_TABLET | Freq: Every day | ORAL | Status: DC
  Administered 2018-10-19 – 2018-11-02 (×15): 5 mg via ORAL
  Filled 2018-10-19 (×15): qty 1

## 2018-10-19 MED ORDER — IBUPROFEN 400 MG PO TABS: 200.0000 mg | ORAL_TABLET | Freq: Every day | ORAL | Status: DC | PRN

## 2018-10-19 MED ORDER — CALCIUM CARBONATE 1250 (500 CA) MG PO TABS
1250.0000 mg | ORAL_TABLET | Freq: Every day | ORAL | Status: DC
  Administered 2018-10-20 – 2018-11-03 (×15): 1250 mg via ORAL
  Filled 2018-10-19 (×15): qty 1

## 2018-10-19 MED ORDER — ACETAMINOPHEN 160 MG/5ML PO SOLN: 650.0000 mg | ORAL | Status: DC | PRN

## 2018-10-19 MED ORDER — SENNOSIDES-DOCUSATE SODIUM 8.6-50 MG PO TABS: 1.0000 | ORAL_TABLET | Freq: Every evening | ORAL | Status: DC | PRN

## 2018-10-19 MED ORDER — ADULT MULTIVITAMIN W/MINERALS CH
1.0000 | ORAL_TABLET | Freq: Every day | ORAL | Status: DC
  Administered 2018-10-20 – 2018-11-03 (×15): 1 via ORAL
  Filled 2018-10-19 (×15): qty 1

## 2018-10-19 MED ORDER — CLOPIDOGREL BISULFATE 75 MG PO TABS
75.0000 mg | ORAL_TABLET | Freq: Every day | ORAL | Status: DC
  Administered 2018-10-20 – 2018-11-03 (×15): 75 mg via ORAL
  Filled 2018-10-19 (×15): qty 1

## 2018-10-19 MED ORDER — ACETAMINOPHEN 325 MG PO TABS: 650.0000 mg | ORAL_TABLET | ORAL | Status: DC | PRN

## 2018-10-19 MED ORDER — OMEGA-3-ACID ETHYL ESTERS 1 G PO CAPS
1.0000 g | ORAL_CAPSULE | Freq: Every day | ORAL | Status: DC
  Administered 2018-10-20 – 2018-11-03 (×15): 1 g via ORAL
  Filled 2018-10-19 (×15): qty 1

## 2018-10-19 MED ORDER — HEPARIN SODIUM (PORCINE) 5000 UNIT/ML IJ SOLN
5000.0000 [IU] | Freq: Three times a day (TID) | INTRAMUSCULAR | Status: DC
  Administered 2018-10-19 – 2018-11-01 (×38): 5000 [IU] via SUBCUTANEOUS
  Filled 2018-10-19 (×38): qty 1

## 2018-10-19 MED ORDER — FOLIC ACID 1 MG PO TABS
1.0000 mg | ORAL_TABLET | Freq: Every day | ORAL | Status: DC
  Administered 2018-10-20 – 2018-11-03 (×15): 1 mg via ORAL
  Filled 2018-10-19 (×15): qty 1

## 2018-10-19 MED ORDER — SORBITOL 70 % SOLN
30.0000 mL | Freq: Every day | Status: DC | PRN
  Administered 2018-10-24: 30 mL via ORAL
  Filled 2018-10-19 (×2): qty 30

## 2018-10-19 MED ORDER — CLOPIDOGREL BISULFATE 75 MG PO TABS: 75.0000 mg | ORAL_TABLET | Freq: Every day | ORAL | 1 refills | Status: DC

## 2018-10-19 MED ORDER — INSULIN ASPART 100 UNIT/ML ~~LOC~~ SOLN
0.0000 [IU] | Freq: Three times a day (TID) | SUBCUTANEOUS | Status: DC
  Administered 2018-10-21 – 2018-10-31 (×2): 1 [IU] via SUBCUTANEOUS

## 2018-10-19 MED ORDER — HEPARIN SODIUM (PORCINE) 5000 UNIT/ML IJ SOLN: 5000.0000 [IU] | Freq: Three times a day (TID) | INTRAMUSCULAR | Status: DC

## 2018-10-19 MED ORDER — ACETAMINOPHEN 650 MG RE SUPP: 650.0000 mg | RECTAL | Status: DC | PRN

## 2018-10-19 NOTE — Progress Notes (Signed)
Inpatient Rehabilitation Admissions Coordinator  I met with pt at bedside to discus goals an expectations of an inpt rehab admit. He is in agreement. I contacted Dr. Leonie Man and Dr. Reesa Chew by phone to clarify workup completion. RN CM and SW made aware. I will make the arrangements to admit today.  Danne Baxter, RN, MSN Rehab Admissions Coordinator [phone] 10/19/2018 2:58 PM

## 2018-10-19 NOTE — Progress Notes (Signed)
Zachary Gong, RN  Rehab Admission Coordinator  Physical Medicine and Rehabilitation  PMR Pre-admission  Signed  Date of Service:  10/19/2018 3:02 PM       Related encounter: ED to Hosp-Admission (Current) from 10/17/2018 in [facility]      Signed         Show:Clear all [x] Manual[x] Template[x] Copied  Added by: [x] Zachary Gong, RN  [] Hover for details PMR Admission Coordinator Pre-Admission Assessment  Patient: Zachary Spence is an 84 y.o., male MRN: [id number] DOB: 1923-10-30 Height: 6' (182.9 cm) Weight: 84.4 kg                                                                                                                                                  Insurance Information HMO:    PPO:      PCP:      IPA:      80/20:      OTHER:  PRIMARY: Medicare a and b      Policy#: [id number] a/pt to provide updated number      Subscriber: pt Benefits:  Phone #: online     Name: 10/19/2018 Eff. Date: 04/21/1990     Deduct: $1364      Out of Pocket Max: none      Life Max: none CIR: 100%      SNF: 20 full days Outpatient: 80%     Co-Pay: 20% Home Health: 100%      Co-Pay: none DME: 80%     Co-Pay: 20% Providers: pt choice  SECONDARY: aarp supplement      Policy#: [id number]      Subscriber: pt  Medicaid Application Date:       Case Manager:  Disability Application Date:       Case Worker:   Emergency Zachary Spence    Name Relation Home Work Mobile   [place] Son [phone]  [phone]     Current Medical History  Patient Admitting Diagnosis: acute ischemia within the posterior left corona radiata  History of Present Illness:Skeelsis a 84 year old right-handed male history of CAD with CABG in 1997, hypertension, diabetes mellitus, non-Hodgkin's lymphoma and left foot drop. Presented 10/17/2018 with generalized weakness and gait instability.Cranial CT scan reviewed, unremarkable for acute  intracranial process. 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 Patient did not receive TPA. MRI of the brain showed small focus of acute ischemia within the posterior left corona radiata. No hemorrhage or mass-effect. CT angiogram of head and neck with no dissection or aneurysm. Echocardiogram with ejection fraction of 60%. Systolic function was normal. Presently on aspirin and Plavix for CVA prophylaxis. Subcutaneous heparin for DVT prophylaxis. Tolerating a regular diet.   Complete NIHSS TOTAL: 0  Past Medical History      Past Medical History:  Diagnosis Date  . Bowel obstruction ([place])   . Diabetes mellitus without complication ([place])   . Hypertension   . Non Hodgkin's lymphoma ([place])     Family History  family history is not on file.  Prior Rehab/Hospitalizations:  Has the patient had major surgery during 100 days prior to admission? No  Current Medications   Current Facility-Administered Medications:  .   stroke: mapping our early stages of recovery book, , Does not apply, Once, Elwyn Reach, MD, Stopped at 10/18/18 [phone] .  0.9 %  sodium chloride infusion, , Intravenous, Continuous, Garba, Mohammad L, MD, Last Rate: 100 mL/hr at 10/18/18 0121 .  acetaminophen (TYLENOL) tablet 650 mg, 650 mg, Oral, Q4H PRN **OR** acetaminophen (TYLENOL) solution 650 mg, 650 mg, Per Tube, Q4H PRN **OR** acetaminophen (TYLENOL) suppository 650 mg, 650 mg, Rectal, Q4H PRN, Jonelle Sidle, Mohammad L, MD .  aspirin EC tablet 81 mg, 81 mg, Oral, Daily, Biby, Sharon L, NP, 81 mg at 10/19/18 1024 .  calcium carbonate (OS-CAL - dosed in mg of elemental calcium) tablet 1,250 mg, 1,250 mg, Oral, Q breakfast, Jonelle Sidle, Mohammad L, MD, 1,250 mg at 10/19/18 0825 .  clopidogrel (PLAVIX) tablet 75 mg, 75 mg, Oral, Daily, Biby, Sharon L, NP, 75 mg at 10/19/18 1024 .  folic acid (FOLVITE) tablet 1 mg, 1 mg, Oral, Daily, Jonelle Sidle, Mohammad L, MD, 1 mg at 10/19/18 1024 .  glipiZIDE (GLUCOTROL) tablet 5  mg, 5 mg, Oral, Q breakfast, Garba, Mohammad L, MD, 5 mg at 10/19/18 0800 .  heparin injection 5,000 Units, 5,000 Units, Subcutaneous, Q8H, Elwyn Reach, MD, 5,000 Units at 10/19/18 [phone] .  ibuprofen (ADVIL,MOTRIN) tablet 200 mg, 200 mg, Oral, Daily PRN, Garba, Mohammad L, MD .  insulin aspart (novoLOG) injection 0-5 Units, 0-5 Units, Subcutaneous, QHS, Garba, Mohammad L, MD .  insulin aspart (novoLOG) injection 0-9 Units, 0-9 Units, Subcutaneous, TID WC, Garba, Mohammad L, MD .  multivitamin (PROSIGHT) tablet 1 tablet, 1 tablet, Oral, Daily, Gala Romney L, MD, 1 tablet at 10/19/18 1026 .  multivitamin with minerals tablet 1 tablet, 1 tablet, Oral, Daily, Elwyn Reach, MD, 1 tablet at 10/19/18 1025 .  omega-3 acid ethyl esters (LOVAZA) capsule 1 g, 1 g, Oral, Daily, Jonelle Sidle, Mohammad L, MD, 1 g at 10/19/18 1024 .  rosuvastatin (CRESTOR) tablet 5 mg, 5 mg, Oral, q1800, Biby, Sharon L, NP, 5 mg at 10/18/18 1901 .  senna-docusate (Senokot-S) tablet 1 tablet, 1 tablet, Oral, QHS PRN, Elwyn Reach, MD  Patients Current Diet:     Diet Order                  Diet Carb Modified Fluid consistency: Thin; Room service appropriate? Yes  Diet effective now               Precautions / Restrictions Precautions Precautions: Fall Other Brace/Splint: Pt wears L AFO at baseline.  Restrictions Weight Bearing Restrictions: No   Has the patient had 2 or more falls or a fall with injury in the past year?No  Prior Activity Level Community (5-7x/wk): Mod I ; very active; gym 5 days per week; drives  Home Assistive Devices / Equipment Home Equipment: Kasandra Knudsen - single point, Shower seat - built in, [employer] - tub/shower  Prior Device Use: Indicate devices/aids used by the patient prior to current illness, exacerbation or injury? cane and AFO  Prior Functional Level Prior Function Level of Independence: Independent with assistive device(s) Comments: Pt uses cane and  L AFO  for ambulation. Reports he was still driving as well. Working out at gym several days a week  Self Care: Did the patient need help bathing, dressing, using the toilet or eating?  Independent  Indoor Mobility: Did the patient need assistance with walking from room to room (with or without device)? Independent  Stairs: Did the patient need assistance with internal or external stairs (with or without device)? Independent  Functional Cognition: Did the patient need help planning regular tasks such as shopping or remembering to take medications? Independent  Current Functional Level Cognition  Overall Cognitive Status: Within Functional Limits for tasks assessed Orientation Level: Oriented X4    Extremity Assessment (includes Sensation/Coordination)  Upper Extremity Assessment: Generalized weakness  Lower Extremity Assessment: RLE deficits/detail, LLE deficits/detail RLE Deficits / Details: Decreased coordination noted with heel to shin test. Functional weakness noted during sidestepping at EOB.  LLE Deficits / Details: L foot drop at baseline. WEakness in LLE at baseline.     ADLs  Overall ADL's : Needs assistance/impaired Grooming: Standing, Min guard Upper Body Bathing: Set up, Sitting Lower Body Bathing: Minimal assistance, Sit to/from stand Upper Body Dressing : Set up, Sitting Lower Body Dressing: Minimal assistance, Sit to/from stand Toilet Transfer: RW, Minimal assistance, Grab bars Toileting- Clothing Manipulation and Hygiene: Minimal assistance, Sit to/from stand General ADL Comments: min A overall with use of RW     Mobility  Overal bed mobility: Needs Assistance Bed Mobility: Supine to Sit, Sit to Supine Supine to sit: Supervision Sit to supine: Supervision General bed mobility comments: Supervision and increased time to perform bed mobility tasks.     Transfers  Overall transfer level: Needs assistance Equipment used: Rolling walker (2 wheeled),  Straight cane Transfers: Sit to/from Stand Sit to Stand: Min assist General transfer comment: Mod A for lift assist and steadying to come to standing.     Ambulation / Gait / Stairs / Wheelchair Mobility  Ambulation/Gait Ambulation/Gait assistance: Mod assist Assistive device: Straight cane General Gait Details: Unsteady when performing side steps at EOB. Noted decreased coordination and required mod A for steadying assist.  Gait velocity: Decreased     Posture / Balance Balance Overall balance assessment: Needs assistance Sitting-balance support: No upper extremity supported, Feet supported Sitting balance-Leahy Scale: Good Standing balance support: Single extremity supported, During functional activity Standing balance-Leahy Scale: Fair Standing balance comment: Reliant on RUE and external support.     Special needs/care consideration BiPAP/CPAP n/a CPM n/a Continuous Drip IV n/a Dialysis n/a Life Vest n/a Oxygen n/a Special Bed n/a Trach Size n/a Wound Vac n/a Skin tear right leg Bowel mgmt: no LBM documented Bladder mgmt: continent Diabetic mgmt yes   Previous Home Environment Living Arrangements: (32 year old son, [name])  Lives With: Son Available Help at Discharge: Family, Available PRN/intermittently Type of Home: House Home Layout: One level Home Access: Stairs to enter Entrance Stairs-Rails: None Technical brewer of Steps: 1 Bathroom Shower/Tub: Multimedia programmer: Handicapped height Bathroom Accessibility: Yes How Accessible: Accessible via walker [place]: No  Discharge Living Setting Plans for Discharge Living Setting: Patient's home, Lives with (comment)(son, David) Type of Home at Discharge: House Discharge Home Layout: One level Discharge Home Access: Stairs to enter Entrance Stairs-Rails: None Entrance Stairs-Number of Steps: 1 Discharge Bathroom Shower/Tub: Walk-in shower Discharge Bathroom Toilet: Handicapped  height Discharge Bathroom Accessibility: Yes How Accessible: Accessible via walker Does the patient have any problems obtaining your medications?: No  Social/Family/Support Systems Patient Roles: Parent  Contact Information: [name], son Anticipated Caregiver: son Anticipated Caregiver's Contact Information: see above Ability/Limitations of Caregiver: son works Building control surveyor Availability: Intermittent Discharge Plan Discussed with Primary Caregiver: Yes Is Caregiver In Agreement with Plan?: Yes Does Caregiver/Family have Issues with Lodging/Transportation while Pt is in Rehab?: No   Goals/Additional Needs Patient/Family Goal for Rehab: Mod I to supervision with PT and OT Expected length of stay: ELOS 8 to 13 days Pt/Family Agrees to Admission and willing to participate: Yes Program Orientation Provided & Reviewed with Pt/Caregiver Including Roles  & Responsibilities: Yes   Decrease burden of Care through IP rehab admission: n/a  Possible need for SNF placement upon discharge: not anticipated  Patient Condition: This patient's condition remains as documented in the consult dated 09/19/2018, in which the Rehabilitation Physician determined and documented that the patient's condition is appropriate for intensive rehabilitative care in an inpatient rehabilitation facility. Will admit to inpatient rehab today.  Preadmission Screen Completed By:  Cleatrice Burke, 10/19/2018 3:02 PM ______________________________________________________________________   Discussed status with Dr. Naaman Plummer on 10/19/2018 at  1507 and received telephone approval for admission today.  Admission Coordinator:  Cleatrice Burke, time 4163 Date 10/19/2018           Cosigned by: Meredith Staggers, MD at 10/19/2018 3:47 PM  Revision History

## 2018-10-19 NOTE — Progress Notes (Signed)
Pt d/c to CIR 4W 25. Pt is stable with no new concerns. Report called to [place]

## 2018-10-19 NOTE — Progress Notes (Signed)
Pt admitted to 4W25. Pt alert and oriented with family at bedside. Pt has no concerns or questions. Bed alarm is in place and call bell is within reach. Continue plan of care.

## 2018-10-19 NOTE — Discharge Summary (Signed)
Physician Discharge Summary  Zachary Spence YWV:[id number] DOB: 10-30-1923 DOA: 10/17/2018  PCP: Tracie Harrier, MD  Admit date: 10/17/2018 Discharge date: 10/19/2018  Admitted From: Home Disposition: Inpatient rehabilitation  Recommendations for Outpatient Follow-up:  1. Follow up with PCP in 1-2 weeks 2. Please obtain BMP/CBC in one week your next doctors visit.  3. Aspirin 81 mg plus Plavix 75 mg daily followed by Plavix alone 4. Follow-up with outpatient neurology in 3-4 weeks 5. Zocor has been switched to Crestor 5 mg daily.   Discharge Condition: Stable CODE STATUS: Full code Diet recommendation: Cardiac  Brief/Interim Summary: 84 year old with a history of coronary artery disease status post bypass in 1997, essential hypertension, non-Hodgkin's lymphoma in remission, diabetes mellitus type 2 came to the hospital with complains of sudden onset of dizziness and weakness.  Patient had a CT of the head in the ER which was negative for acute finding but MRI showed small acute ischemic stroke and posterior left corona radiata.  Neurology was consulted.  Patient was admitted for routine work-up.  Patient's Zocor was changed to Crestor 5 mg.  CTA of head and neck showed right anterior parotid node increased in size systems 2016 which can be followed up outpatient.  Plan is to continue aspirin 81 mg and Plavix 75 mg daily for 3 weeks followed by Plavix alone.  Physical therapy recommended CIR. At this time patient is reached maximum benefit from hospital stay and stable to be discharged to inpatient rehabilitation for further care.   Discharge Diagnoses:  Principal Problem:   CVA (cerebral vascular accident) ([facility]) Active Problems:   Carpal tunnel syndrome   Diabetes mellitus (HCC)   BP (high blood pressure)   HLD (hyperlipidemia)   H/O coronary artery bypass surgery   Benign essential HTN   Stage 2 chronic kidney disease  Acute CVA and left posterior coronary  radiata Dizziness -MRI brain without contrast-left posterior coronary radiata CVA.  CT of the head and neck showed right anterior parotid node increased in size which can be followed up outpatient. -Continue aspirin 81 mg and Plavix 75 mg daily for 3 weeks followed by Plavix alone -Echocardiogram- EF 55 to 60%, mild LVH -Lipid Panel-LDL 132, and A1C-6.5 -Frequent neuro checks -Zocor changed to Crestor per neurology -Risk factor modification -Consult Neurology-appreciate their input -PT/OT eval- CIR  Coronary artery disease status post CABG -Currently patient is chest pain-free and does not have any complaints.  Resume his home medications.  Diabetes mellitus type 2 -Continue glipizide, insulin sliding scale and Accu-Chek  Hyperlipidemia -LDL elevated 132, Zocor changed to Crestor  Essential hypertension -Resume at the time of discharge  DVT prophylaxis: Heparin Code Status: Full code Family Communication: None at bedside Disposition Plan:  Discharge to [facility]   Discharge Instructions   Allergies as of 10/19/2018      Reactions   Erythromycin Swelling   Swelling of hands      Medication List    STOP taking these medications   simvastatin 40 MG tablet Commonly known as:  ZOCOR     TAKE these medications   aspirin 81 MG EC tablet Take 1 tablet (81 mg total) by mouth daily. Start taking on:  10/20/2018   atenolol 25 MG tablet Commonly known as:  TENORMIN Take 25 mg by mouth daily.   CALCIUM 600 600 MG Tabs tablet Generic drug:  calcium carbonate Take 600 mg by mouth daily.   clopidogrel 75 MG tablet Commonly known as:  PLAVIX Take 1 tablet (75 mg  total) by mouth daily. Start taking on:  10/20/2018   Fish Oil 1000 MG Caps Take 1,000 mg by mouth daily.   folic acid 1 MG tablet Commonly known as:  FOLVITE Take 1 mg by mouth daily.   glipiZIDE 5 MG tablet Commonly known as:  GLUCOTROL Take 5 mg by mouth daily.   ibuprofen 200 MG tablet Commonly  known as:  ADVIL,MOTRIN Take 200 mg by mouth daily as needed for headache (pain).   multivitamin with minerals Tabs tablet Take 1 tablet by mouth daily.   PRESERVISION AREDS 2 PO Take 1 capsule by mouth daily.   rosuvastatin 5 MG tablet Commonly known as:  CRESTOR Take 1 tablet (5 mg total) by mouth daily at 6 PM.      Follow-up Information    Hande, Vishwanath, MD. Schedule an appointment as soon as possible for a visit in 1 week(s).   Specialty:  Internal Medicine Contact information: [address] [place] [address] [phone]        Garvin Fila, MD. Schedule an appointment as soon as possible for a visit in 3 week(s).   Specialties:  Neurology, Radiology Contact information: [address] [place] [address] [phone]          Allergies  Allergen Reactions  . Erythromycin Swelling    Swelling of hands    You were cared for by a hospitalist during your hospital stay. If you have any questions about your discharge medications or the care you received while you were in the hospital after you are discharged, you can call the unit and asked to speak with the hospitalist on call if the hospitalist that took care of you is not available. Once you are discharged, your primary care physician will handle any further medical issues. Please note that no refills for any discharge medications will be authorized once you are discharged, as it is imperative that you return to your primary care physician (or establish a relationship with a primary care physician if you do not have one) for your aftercare needs so that they can reassess your need for medications and monitor your lab values.  Consultations:  Neurology   Procedures/Studies: Ct Angio Head W Or Wo Contrast  Result Date: 10/18/2018 CLINICAL DATA:  84 y/o  M; stroke for follow-up. EXAM: CT ANGIOGRAPHY HEAD AND NECK TECHNIQUE: Multidetector CT imaging of the  head and neck was performed using the standard protocol during bolus administration of intravenous contrast. Multiplanar CT image reconstructions and MIPs were obtained to evaluate the vascular anatomy. Carotid stenosis measurements (when applicable) are obtained utilizing NASCET criteria, using the distal internal carotid diameter as the denominator. CONTRAST:  50 cc Isovue 370 COMPARISON:  10/17/2018 MRI and CT of the head. 11/18/2015 CT head. FINDINGS: CTA NECK FINDINGS Aortic arch: Bovine variant branching. Imaged portion shows no evidence of aneurysm or dissection. No significant stenosis of the major arch vessel origins. Atherosclerosis with mixed plaque. Right carotid system: No evidence of dissection, stenosis (50% or greater) or occlusion. Mild non stenotic calcific atherosclerosis of carotid bifurcation. Left carotid system: No evidence of dissection, stenosis (50% or greater) or occlusion. Vertebral arteries: Left dominant. No evidence of dissection, stenosis (50% or greater) or occlusion. Skeleton: Mild-to-moderate cervical spondylosis with multilevel disc and facet degenerative changes. No high-grade bony canal stenosis. C3-4 grade 1 anterolisthesis due to facet arthropathy. Other neck: Right anterior parotid nodule is increased in size from 2016 measuring 18 x 14  mm (AP by ML series 6, image 84). Upper chest: Status post CABG with LIMA and saphenous grafts, incompletely visualized. Healed upper median sternotomy. Review of the MIP images confirms the above findings CTA HEAD FINDINGS Anterior circulation: No significant stenosis, proximal occlusion, aneurysm, or vascular malformation. Mild non stenotic calcific atherosclerosis of carotid siphons. Posterior circulation: No significant stenosis, proximal occlusion, aneurysm, or vascular malformation. Mild right V4 stenosis. Venous sinuses: As permitted by contrast timing, patent. Anatomic variants: Complete circle-of-Willis. Fetal right PCA. Delayed  phase: No abnormal intracranial enhancement. Review of the MIP images confirms the above findings IMPRESSION: 1. Patent carotid and vertebral arteries. No dissection, aneurysm, or hemodynamically significant stenosis utilizing NASCET criteria. 2. Patent anterior and posterior intracranial circulation. No large vessel occlusion, aneurysm, or significant stenosis. 3. Right anterior parotid nodule is increased in size from 2016. 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 If clinically indicated, consider MRI of the face with and without contrast for further evaluation and possible surgical consultation on a nonemergent basis. Electronically Signed   By: Kristine Garbe M.D.   On: 10/18/2018 01:31   Ct Head Wo Contrast  Result Date: 10/17/2018 CLINICAL DATA:  Unsteady gait. Leg weakness. Symptoms since this morning. EXAM: CT HEAD WITHOUT CONTRAST TECHNIQUE: Contiguous axial images were obtained from the base of the skull through the vertex without intravenous contrast. COMPARISON:  11/18/2015 FINDINGS: Brain: No evidence of acute infarction, hemorrhage, hydrocephalus, extra-axial collection or mass lesion/mass effect. There is mild ventricular and sulcal enlargement reflecting age-appropriate volume loss. Small deep white matter lacunar infarct in the left parietal lobe. Small old lacunar infarct in the left caudate nucleus head. Small old lacunar infarct, right cerebellum. Vascular: No hyperdense vessel or unexpected calcification. Skull: Normal. Negative for fracture or focal lesion. Sinuses/Orbits: Globes and orbits are unremarkable. The visualized sinuses and mastoid air cells are clear. Other: None. IMPRESSION: 1. No acute intracranial abnormalities. 2. Age-appropriate volume loss.  Three small old lacunar infarcts. Electronically Signed   By: Lajean Manes M.D.   On: 10/17/2018 18:39   Ct Angio Neck W Or  Wo Contrast  Result Date: 10/18/2018 CLINICAL DATA:  84 y/o  M; stroke for follow-up. EXAM: CT ANGIOGRAPHY HEAD AND NECK TECHNIQUE: Multidetector CT imaging of the head and neck was performed using the standard protocol during bolus administration of intravenous contrast. Multiplanar CT image reconstructions and MIPs were obtained to evaluate the vascular anatomy. Carotid stenosis measurements (when applicable) are obtained utilizing NASCET criteria, using the distal internal carotid diameter as the denominator. CONTRAST:  50 cc Isovue 370 COMPARISON:  10/17/2018 MRI and CT of the head. 11/18/2015 CT head. FINDINGS: CTA NECK FINDINGS Aortic arch: Bovine variant branching. Imaged portion shows no evidence of aneurysm or dissection. No significant stenosis of the major arch vessel origins. Atherosclerosis with mixed plaque. Right carotid system: No evidence of dissection, stenosis (50% or greater) or occlusion. Mild non stenotic calcific atherosclerosis of carotid bifurcation. Left carotid system: No evidence of dissection, stenosis (50% or greater) or occlusion. Vertebral arteries: Left dominant. No evidence of dissection, stenosis (50% or greater) or occlusion. Skeleton: Mild-to-moderate cervical spondylosis with multilevel disc and facet degenerative changes. No high-grade bony canal stenosis. C3-4 grade 1 anterolisthesis due to facet arthropathy. Other neck: Right anterior parotid nodule is increased in size from 2016 measuring 18 x 14 mm (AP by ML series 6, image 84). Upper chest: Status post CABG with LIMA and  saphenous grafts, incompletely visualized. Healed upper median sternotomy. Review of the MIP images confirms the above findings CTA HEAD FINDINGS Anterior circulation: No significant stenosis, proximal occlusion, aneurysm, or vascular malformation. Mild non stenotic calcific atherosclerosis of carotid siphons. Posterior circulation: No significant stenosis, proximal occlusion, aneurysm, or vascular  malformation. Mild right V4 stenosis. Venous sinuses: As permitted by contrast timing, patent. Anatomic variants: Complete circle-of-Willis. Fetal right PCA. Delayed phase: No abnormal intracranial enhancement. Review of the MIP images confirms the above findings IMPRESSION: 1. Patent carotid and vertebral arteries. No dissection, aneurysm, or hemodynamically significant stenosis utilizing NASCET criteria. 2. Patent anterior and posterior intracranial circulation. No large vessel occlusion, aneurysm, or significant stenosis. 3. Right anterior parotid nodule is increased in size from 2016. 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 If clinically indicated, consider MRI of the face with and without contrast for further evaluation and possible surgical consultation on a nonemergent basis. Electronically Signed   By: Kristine Garbe M.D.   On: 10/18/2018 01:31   Mr Brain Wo Contrast  Result Date: 10/17/2018 CLINICAL DATA:  Unsteady gait and lower extremity weakness. EXAM: MRI HEAD WITHOUT CONTRAST TECHNIQUE: Multiplanar, multiecho pulse sequences of the brain and surrounding structures were obtained without intravenous contrast. COMPARISON:  Head CT 10/17/2018 FINDINGS: BRAIN: Focus of abnormal diffusion restriction within the posterior left corona radiata. No other diffusion abnormality. The midline structures are normal. No midline shift or other mass effect. Old right cerebellar infarct. Old left caudate and left corona radiata infarcts. Mild periventricular white matter hyperintensity. Generalized atrophy without lobar predilection. Susceptibility-sensitive sequences show no chronic microhemorrhage or superficial siderosis. VASCULAR: Major intracranial arterial and venous sinus flow voids are normal. SKULL AND UPPER CERVICAL SPINE: Calvarial bone marrow signal is normal. There is no skull base mass.  Visualized upper cervical spine and soft tissues are normal. SINUSES/ORBITS: No fluid levels or advanced mucosal thickening. No mastoid or middle ear effusion. The orbits are normal. IMPRESSION: 1. Small focus of acute ischemia within the posterior left corona radiata. No hemorrhage or mass effect. 2. Findings of chronic small vessel ischemia, including multiple old lacunar infarcts. Electronically Signed   By: Ulyses Jarred M.D.   On: 10/17/2018 21:17      Subjective: Still complains of weakness but no other new symptoms.  Was able to walk in the morning with the assistance of walker and an aide to the bathroom.  General = no fevers, chills, malaise, fatigue HEENT/EYES = negative for pain, redness, loss of vision, double vision, blurred vision, loss of hearing, sore throat, hoarseness, dysphagia Cardiovascular= negative for chest pain, palpitation, murmurs, lower extremity swelling Respiratory/lungs= negative for shortness of breath, cough, hemoptysis, wheezing, mucus production Gastrointestinal= negative for nausea, vomiting,, abdominal pain, melena, hematemesis Genitourinary= negative for Dysuria, Hematuria, Change in Urinary Frequency MSK = Negative for arthralgia, myalgias, Back Pain, Joint swelling  Neurology= Negative for headache, seizures, numbness, tingling  Psychiatry= Negative for anxiety, depression, suicidal and homocidal ideation Allergy/Immunology= Medication/Food allergy as listed  Skin= Negative for Rash, lesions, ulcers, itching   Discharge Exam: Vitals:   10/19/18 0744 10/19/18 1256  BP: 117/71 103/63  Pulse: 71 67  Resp: 20 15  Temp: 97.8 F (36.6 C) 98.2 F (36.8 C)  SpO2: 95% 95%   Vitals:   10/19/18 0000 10/19/18 0634 10/19/18 0744 10/19/18 1256  BP: (!) 101/57 129/69 117/71 103/63  Pulse: 74 69 71 67  Resp: 18 18  20 15  Temp: 98.4 F (36.9 C) 97.7 F (36.5 C) 97.8 F (36.6 C) 98.2 F (36.8 C)  TempSrc: Oral Oral Oral Oral  SpO2: 94% 91% 95% 95%   Weight:      Height:        General: Pt is alert, awake, not in acute distress Cardiovascular: RRR, S1/S2 +, no rubs, no gallops Respiratory: CTA bilaterally, no wheezing, no rhonchi Abdominal: Soft, NT, ND, bowel sounds + Extremities: no edema, no cyanosis Overall he is unsteady gait but no obvious focal neuro deficits. 4/5 strength in all 4 extremities.  Cranial nerve II-XII intact   The results of significant diagnostics from this hospitalization (including imaging, microbiology, ancillary and laboratory) are listed below for reference.     Microbiology: No results found for this or any previous visit (from the past 240 hour(s)).   Labs: BNP (last 3 results) No results for input(s): BNP in the last 8760 hours. Basic Metabolic Panel: Recent Labs  Lab 10/17/18 1813 10/18/18 0016 10/18/18 0313  NA 140  --  141  K 4.3  --  3.8  CL 109  --  109  CO2 23  --  22  GLUCOSE 101*  --  94  BUN 24*  --  21  CREATININE 1.13 1.12 1.06  CALCIUM 9.4  --  8.9   Liver Function Tests: Recent Labs  Lab 10/17/18 1813 10/18/18 0313  AST 25 22  ALT 21 18  ALKPHOS 53 48  BILITOT 0.6 0.9  PROT 7.1 6.3*  ALBUMIN 4.0 3.7   No results for input(s): LIPASE, AMYLASE in the last 168 hours. No results for input(s): AMMONIA in the last 168 hours. CBC: Recent Labs  Lab 10/17/18 1813 10/18/18 0016 10/18/18 0313  WBC 7.5 8.1 8.1  NEUTROABS 3.6  --   --   HGB 14.5 14.4 13.8  HCT 47.2 44.7 43.4  MCV 97.3 95.1 96.7  PLT 312 283 281   Cardiac Enzymes: No results for input(s): CKTOTAL, CKMB, CKMBINDEX, TROPONINI in the last 168 hours. BNP: Invalid input(s): POCBNP CBG: Recent Labs  Lab 10/18/18 1146 10/18/18 1807 10/18/18 2114 10/19/18 0632 10/19/18 1118  GLUCAP 109* 80 120* 109* 103*   D-Dimer No results for input(s): DDIMER in the last 72 hours. Hgb A1c Recent Labs    10/18/18 0313  HGBA1C 6.5*   Lipid Profile Recent Labs    10/18/18 0313  CHOL 193  HDL 39*   LDLCALC 132*  TRIG 112  CHOLHDL 4.9   Thyroid function studies No results for input(s): TSH, T4TOTAL, T3FREE, THYROIDAB in the last 72 hours.  Invalid input(s): FREET3 Anemia work up No results for input(s): VITAMINB12, FOLATE, FERRITIN, TIBC, IRON, RETICCTPCT in the last 72 hours. Urinalysis    Component Value Date/Time   COLORURINE Yellow 12/30/2012 0534   APPEARANCEUR Hazy 12/30/2012 0534   LABSPEC 1.027 12/30/2012 0534   PHURINE 5.0 12/30/2012 0534   GLUCOSEU 150 mg/dL 12/30/2012 0534   HGBUR Negative 12/30/2012 0534   BILIRUBINUR Negative 12/30/2012 0534   KETONESUR Negative 12/30/2012 0534   PROTEINUR 30 mg/dL 12/30/2012 0534   NITRITE Negative 12/30/2012 0534   LEUKOCYTESUR Negative 12/30/2012 0534   Sepsis Labs Invalid input(s): PROCALCITONIN,  WBC,  LACTICIDVEN Microbiology No results found for this or any previous visit (from the past 240 hour(s)).   Time coordinating discharge:  I have spent 35 minutes face to face with the patient and on the ward discussing the patients care, assessment, plan and disposition  with other care givers. >50% of the time was devoted counseling the patient about the risks and benefits of treatment/Discharge disposition and coordinating care.   SIGNED:   Damita Lack, MD  [facility] 10/19/2018, 4:18 PM Pager   If 7PM-7AM, please contact night-coverage [email or web address] Password [email or web address]

## 2018-10-19 NOTE — Evaluation (Signed)
Occupational Therapy Evaluation Patient Details Name: Zachary Spence MRN: [id number] DOB: 12-10-1923 Today's Date: 10/19/2018    History of Present Illness PT is a 84 y/o male admitted secondary to weakness and pt reported "wobbling" during ambulation. 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 PMH includes DM, HTN, nonhodgkins lymphoma, and L foot drop at baseline.    Clinical Impression   Patient presenting with decreased I in self care, balance, functional transfers/mobility, safety awareness, strength, coordination, and endurance.  Patient was independent PTA. He lived with son and went to gym multiple times each week. Still drives during the day only.Patient currently functioning at min A overall.Steady assistance with use of RW and min A- mod A for balance with use of SPC. Patient will benefit from acute OT to increase overall independence in the areas of ADLs, functional mobility, and safety in order to safely discharge to next venue of care.    Follow Up Recommendations  CIR;Supervision/Assistance - 24 hour    Equipment Recommendations  Other (comment)(defer to next venue of care)    Recommendations for Other Services Rehab consult     Precautions / Restrictions Precautions Precautions: Fall Required Braces or Orthoses: Other Brace/Splint Other Brace/Splint: Pt wears L AFO at baseline.  Restrictions Weight Bearing Restrictions: No      Mobility Bed Mobility Overal bed mobility: Needs Assistance Bed Mobility: Supine to Sit;Sit to Supine     Supine to sit: Supervision Sit to supine: Supervision   General bed mobility comments: Supervision and increased time to perform bed mobility tasks.   Transfers Overall transfer level: Needs assistance Equipment used: Rolling walker (2 wheeled);Straight cane Transfers: Sit to/from Stand Sit to Stand: Min assist              Balance Overall balance assessment: Needs assistance Sitting-balance support: No upper  extremity supported;Feet supported Sitting balance-Leahy Scale: Good     Standing balance support: Single extremity supported;During functional activity Standing balance-Leahy Scale: Fair Standing balance comment: Reliant on RUE and external support.          ADL either performed or assessed with clinical judgement   ADL Overall ADL's : Needs assistance/impaired     Grooming: Standing;Min guard   Upper Body Bathing: Set up;Sitting   Lower Body Bathing: Minimal assistance;Sit to/from stand   Upper Body Dressing : Set up;Sitting   Lower Body Dressing: Minimal assistance;Sit to/from stand   Toilet Transfer: RW;Minimal assistance;Grab bars   Toileting- Water quality scientist and Hygiene: Minimal assistance;Sit to/from stand         General ADL Comments: min A overall with use of RW      Vision Baseline Vision/History: Wears glasses Wears Glasses: At all times Patient Visual Report: No change from baseline              Pertinent Vitals/Pain Pain Assessment: No/denies pain     Hand Dominance Right   Extremity/Trunk Assessment Upper Extremity Assessment Upper Extremity Assessment: Generalized weakness   Lower Extremity Assessment RLE Deficits / Details: Decreased coordination noted with heel to shin test. Functional weakness noted during sidestepping at EOB.  LLE Deficits / Details: L foot drop at baseline. WEakness in LLE at baseline.    Cervical / Trunk Assessment Cervical / Trunk Assessment: Kyphotic   Communication Communication Communication: HOH   Cognition Arousal/Alertness: Awake/alert Behavior During Therapy: WFL for tasks assessed/performed Overall Cognitive Status: Within Functional Limits for tasks assessed  Home Living Family/patient expects to be discharged to:: Private residence Living Arrangements: Children Available Help at Discharge: Family;Available PRN/intermittently Type of Home: House Home Access: Stairs to  enter [employer] of Steps: 1 Entrance Stairs-Rails: None Home Layout: One level     Bathroom Shower/Tub: Occupational psychologist: Handicapped height     Home Equipment: Cane - single point;Shower seat - built in;Grab bars - tub/shower          Prior Functioning/Environment Level of Independence: Independent with assistive device(s)        Comments: Pt uses cane and L AFO for ambulation. Reports he was still driving as well. Working out at gym several days a week        OT Problem List: Decreased strength;Decreased knowledge of use of DME or AE;Decreased coordination;Decreased activity tolerance;Impaired balance (sitting and/or standing);Decreased safety awareness      OT Treatment/Interventions: Self-care/ADL training;Balance training;Therapeutic exercise;Neuromuscular education;Therapeutic activities;Energy conservation;Patient/family education    OT Goals(Current goals can be found in the care plan section) Acute Rehab OT Goals Patient Stated Goal: to gain independence and then return home OT Goal Formulation: With patient/family Time For Goal Achievement: 11/02/18 Potential to Achieve Goals: Good ADL Goals Pt Will Perform Lower Body Bathing: with modified independence Pt Will Perform Lower Body Dressing: with modified independence Pt Will Transfer to Toilet: with modified independence Pt Will Perform Toileting - Clothing Manipulation and hygiene: with modified independence Pt Will Perform Tub/Shower Transfer: with modified independence  OT Frequency: Min 2X/week   Barriers to D/C: Other (comment)  none known at this time          AM-PAC PT "6 Clicks" Daily Activity     Outcome Measure Help from another person eating meals?: None Help from another person taking care of personal grooming?: A Little Help from another person toileting, which includes using toliet, bedpan, or urinal?: A Little Help from another person bathing (including  washing, rinsing, drying)?: A Little Help from another person to put on and taking off regular upper body clothing?: A Little Help from another person to put on and taking off regular lower body clothing?: A Little 6 Click Score: 19   End of Session Equipment Utilized During Treatment: Rolling walker;Other (comment)(straight cane) Nurse Communication: Mobility status  Activity Tolerance: Patient tolerated treatment well Patient left: in bed;with call bell/phone within reach;with bed alarm set;with family/visitor present  OT Visit Diagnosis: Unsteadiness on feet (R26.81);Hemiplegia and hemiparesis Hemiplegia - Right/Left: Right Hemiplegia - dominant/non-dominant: Dominant Hemiplegia - caused by: Cerebral infarction                Time: 1740-8144 OT Time Calculation (min): 19 min Charges:  OT General Charges $OT Visit: 1 Visit OT Evaluation $OT Eval Low Complexity: 1 Low  Kail Fraley P, MS, OTR/L 10/19/2018, 9:48 AM

## 2018-10-19 NOTE — Consult Note (Addendum)
Physical Medicine and Rehabilitation Consult Reason for Consult: Decreased functional mobility Referring Physician: [facility]   HPI: Zachary Spence is a 84 y.o. right-handed male with history of CAD with CABG 1997, hypertension, diabetes mellitus, non-Hodgkin's lymphoma and left foot drop.  Presented 10/17/2018 with generalized weakness and gait instability.  Per chart review, patient, and son, patient lives with son.  Independent with cane and left AFO brace.  He still drives.  Son works during the day.  One level home with one-step to entry.  Cranial CT scan reviewed, unremarkable for acute intracranial process. 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  Patient did not receive TPA.  MRI of the brain showed small focus of acute ischemia within the posterior left corona radiata.  No hemorrhage or mass-effect.  CT angiogram of head and neck with no dissection or aneurysm.  Echocardiogram with ejection fraction of 60%.  Systolic function normal.  Presently on aspirin and Plavix for CVA prophylaxis.  Subcutaneous heparin for DVT prophylaxis.  Tolerating a regular diet.  Therapy evaluation completed with recommendations of physical medicine rehab consult.   Review of Systems  Constitutional: Negative for chills and fever.  HENT: Negative for hearing loss.   Eyes: Negative for blurred vision and double vision.  Respiratory: Negative for cough and shortness of breath.   Cardiovascular: Negative for chest pain, palpitations and leg swelling.  Gastrointestinal: Positive for constipation. Negative for nausea and vomiting.  Genitourinary: Positive for urgency. Negative for dysuria, flank pain and hematuria.  Musculoskeletal: Positive for myalgias.  Skin: Negative for rash.  Neurological: Positive for dizziness and weakness. Negative for tremors.  All other systems reviewed and are negative.  Past Medical History:  Diagnosis Date  . Bowel obstruction ([place])   . Diabetes mellitus without  complication ([place])   . Hypertension   . Non Hodgkin's lymphoma [facility]    Past Surgical History:  Procedure Laterality Date  . APPENDECTOMY    . CORONARY ARTERY BYPASS GRAFT    . SPLENECTOMY     No pertinent family history of premature CVA. Social History:  reports that he has quit smoking. He has never used smokeless tobacco. He reports that he does not drink alcohol or use drugs. Allergies:  Allergies  Allergen Reactions  . Erythromycin Swelling    Swelling of hands   Medications Prior to Admission  Medication Sig Dispense Refill  . atenolol (TENORMIN) 25 MG tablet Take 25 mg by mouth daily.     . calcium carbonate (CALCIUM 600) 600 MG TABS tablet Take 600 mg by mouth daily.    . folic acid (FOLVITE) 1 MG tablet Take 1 mg by mouth daily.    Marland Kitchen glipiZIDE (GLUCOTROL) 5 MG tablet Take 5 mg by mouth daily.     Marland Kitchen ibuprofen (ADVIL,MOTRIN) 200 MG tablet Take 200 mg by mouth daily as needed for headache (pain).    . Multiple Vitamin (MULTIVITAMIN WITH MINERALS) TABS tablet Take 1 tablet by mouth daily.    . Multiple Vitamins-Minerals (PRESERVISION AREDS 2 PO) Take 1 capsule by mouth daily.     . Omega-3 Fatty Acids (FISH OIL) 1000 MG CAPS Take 1,000 mg by mouth daily.     . simvastatin (ZOCOR) 40 MG tablet Take 20 mg by mouth See admin instructions. Take 1/2 tablet (20 mg) by mouth every Tuesday, Thursday, Saturday      Home: Home Living Family/patient expects to be discharged to:: Private residence Living Arrangements: Children Available Help at Discharge: Family,  Available PRN/intermittently Type of Home: House Home Access: Stairs to enter [employer] of Steps: 1 Entrance Stairs-Rails: None Home Layout: One level Bathroom Shower/Tub: Multimedia programmer: Standard Home Equipment: Radio producer - single point, Civil engineer, contracting - built in, [employer] - tub/shower  Functional History: Prior Function Level of Independence: Independent with assistive device(s) Comments: Pt  uses cane and L AFO for ambulation. Reports he was still driving as well.  Functional Status:  Mobility: Bed Mobility Overal bed mobility: Needs Assistance Bed Mobility: Supine to Sit, Sit to Supine Supine to sit: Supervision Sit to supine: Supervision General bed mobility comments: Supervision and increased time to perform bed mobility tasks.  Transfers Overall transfer level: Needs assistance Equipment used: Straight cane Transfers: Sit to/from Stand Sit to Stand: Mod assist General transfer comment: Mod A for lift assist and steadying to come to standing.  Ambulation/Gait Ambulation/Gait assistance: Mod assist Assistive device: Straight cane General Gait Details: Unsteady when performing side steps at EOB. Noted decreased coordination and required mod A for steadying assist.  Gait velocity: Decreased     ADL:    Cognition: Cognition Overall Cognitive Status: Within Functional Limits for tasks assessed Orientation Level: Oriented X4 Cognition Arousal/Alertness: Awake/alert Behavior During Therapy: WFL for tasks assessed/performed Overall Cognitive Status: Within Functional Limits for tasks assessed  Blood pressure 117/71, pulse 71, temperature 97.8 F (36.6 C), temperature source Oral, resp. rate 20, height 6' (1.829 m), weight 84.4 kg, SpO2 95 %. Physical Exam  Vitals reviewed. Constitutional: He is oriented to person, place, and time. He appears well-developed and well-nourished.  Fair sitting balance  HENT:  Head: Normocephalic and atraumatic.  Eyes: EOM are normal. Right eye exhibits no discharge. Left eye exhibits no discharge.  Neck: Normal range of motion. Neck supple. No thyromegaly present.  Cardiovascular: Normal rate and regular rhythm.  Murmur heard. Respiratory: Effort normal and breath sounds normal. No respiratory distress.  GI: Soft. Bowel sounds are normal. He exhibits no distension.  Neurological: He is alert and oriented to person, place, and time.   Follows full commands Motor: Grossly 4-4+/5 throughout, except left ankle dorsiflexion 0/5 Sensation intact to light touch No ataxia b/l UE  Skin: Skin is warm and dry.  Psychiatric: He has a normal mood and affect. His behavior is normal.    Results for orders placed or performed during the hospital encounter of 10/17/18 (from the past 24 hour(s))  CBG monitoring, ED     Status: Abnormal   Collection Time: 10/18/18 11:46 AM  Result Value Ref Range   Glucose-Capillary 109 (H) 70 - 99 mg/dL   Comment 1 Notify RN    Comment 2 Document in Chart   Glucose, capillary     Status: None   Collection Time: 10/18/18  6:07 PM  Result Value Ref Range   Glucose-Capillary 80 70 - 99 mg/dL   Comment 1 Notify RN    Comment 2 Document in Chart   Glucose, capillary     Status: Abnormal   Collection Time: 10/18/18  9:14 PM  Result Value Ref Range   Glucose-Capillary 120 (H) 70 - 99 mg/dL   Comment 1 Notify RN    Comment 2 Document in Chart   Glucose, capillary     Status: Abnormal   Collection Time: 10/19/18  6:32 AM  Result Value Ref Range   Glucose-Capillary 109 (H) 70 - 99 mg/dL   Comment 1 Notify RN    Comment 2 Document in Chart    Ct  Angio Head W Or Wo Contrast  Result Date: 10/18/2018 CLINICAL DATA:  84 y/o  M; stroke for follow-up. EXAM: CT ANGIOGRAPHY HEAD AND NECK TECHNIQUE: Multidetector CT imaging of the head and neck was performed using the standard protocol during bolus administration of intravenous contrast. Multiplanar CT image reconstructions and MIPs were obtained to evaluate the vascular anatomy. Carotid stenosis measurements (when applicable) are obtained utilizing NASCET criteria, using the distal internal carotid diameter as the denominator. CONTRAST:  50 cc Isovue 370 COMPARISON:  10/17/2018 MRI and CT of the head. 11/18/2015 CT head. FINDINGS: CTA NECK FINDINGS Aortic arch: Bovine variant branching. Imaged portion shows no evidence of aneurysm or dissection. No significant  stenosis of the major arch vessel origins. Atherosclerosis with mixed plaque. Right carotid system: No evidence of dissection, stenosis (50% or greater) or occlusion. Mild non stenotic calcific atherosclerosis of carotid bifurcation. Left carotid system: No evidence of dissection, stenosis (50% or greater) or occlusion. Vertebral arteries: Left dominant. No evidence of dissection, stenosis (50% or greater) or occlusion. Skeleton: Mild-to-moderate cervical spondylosis with multilevel disc and facet degenerative changes. No high-grade bony canal stenosis. C3-4 grade 1 anterolisthesis due to facet arthropathy. Other neck: Right anterior parotid nodule is increased in size from 2016 measuring 18 x 14 mm (AP by ML series 6, image 84). Upper chest: Status post CABG with LIMA and saphenous grafts, incompletely visualized. Healed upper median sternotomy. Review of the MIP images confirms the above findings CTA HEAD FINDINGS Anterior circulation: No significant stenosis, proximal occlusion, aneurysm, or vascular malformation. Mild non stenotic calcific atherosclerosis of carotid siphons. Posterior circulation: No significant stenosis, proximal occlusion, aneurysm, or vascular malformation. Mild right V4 stenosis. Venous sinuses: As permitted by contrast timing, patent. Anatomic variants: Complete circle-of-Willis. Fetal right PCA. Delayed phase: No abnormal intracranial enhancement. Review of the MIP images confirms the above findings IMPRESSION: 1. Patent carotid and vertebral arteries. No dissection, aneurysm, or hemodynamically significant stenosis utilizing NASCET criteria. 2. Patent anterior and posterior intracranial circulation. No large vessel occlusion, aneurysm, or significant stenosis. 3. Right anterior parotid nodule is increased in size from 2016. 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 If clinically indicated,  consider MRI of the face with and without contrast for further evaluation and possible surgical consultation on a nonemergent basis. Electronically Signed   By: Kristine Garbe M.D.   On: 10/18/2018 01:31   Ct Head Wo Contrast  Result Date: 10/17/2018 CLINICAL DATA:  Unsteady gait. Leg weakness. Symptoms since this morning. EXAM: CT HEAD WITHOUT CONTRAST TECHNIQUE: Contiguous axial images were obtained from the base of the skull through the vertex without intravenous contrast. COMPARISON:  11/18/2015 FINDINGS: Brain: No evidence of acute infarction, hemorrhage, hydrocephalus, extra-axial collection or mass lesion/mass effect. There is mild ventricular and sulcal enlargement reflecting age-appropriate volume loss. Small deep white matter lacunar infarct in the left parietal lobe. Small old lacunar infarct in the left caudate nucleus head. Small old lacunar infarct, right cerebellum. Vascular: No hyperdense vessel or unexpected calcification. Skull: Normal. Negative for fracture or focal lesion. Sinuses/Orbits: Globes and orbits are unremarkable. The visualized sinuses and mastoid air cells are clear. Other: None. IMPRESSION: 1. No acute intracranial abnormalities. 2. Age-appropriate volume loss.  Three small old lacunar infarcts. Electronically Signed   By: Lajean Manes M.D.   On: 10/17/2018 18:39   Ct Angio Neck W Or Wo Contrast  Result Date: 10/18/2018 CLINICAL DATA:  84 y/o  M; stroke for follow-up. EXAM: CT ANGIOGRAPHY HEAD AND NECK TECHNIQUE: Multidetector CT imaging of the head and neck was performed using the standard protocol during bolus administration of intravenous contrast. Multiplanar CT image reconstructions and MIPs were obtained to evaluate the vascular anatomy. Carotid stenosis measurements (when applicable) are obtained utilizing NASCET criteria, using the distal internal carotid diameter as the denominator. CONTRAST:  50 cc Isovue 370 COMPARISON:  10/17/2018 MRI and CT of the  head. 11/18/2015 CT head. FINDINGS: CTA NECK FINDINGS Aortic arch: Bovine variant branching. Imaged portion shows no evidence of aneurysm or dissection. No significant stenosis of the major arch vessel origins. Atherosclerosis with mixed plaque. Right carotid system: No evidence of dissection, stenosis (50% or greater) or occlusion. Mild non stenotic calcific atherosclerosis of carotid bifurcation. Left carotid system: No evidence of dissection, stenosis (50% or greater) or occlusion. Vertebral arteries: Left dominant. No evidence of dissection, stenosis (50% or greater) or occlusion. Skeleton: Mild-to-moderate cervical spondylosis with multilevel disc and facet degenerative changes. No high-grade bony canal stenosis. C3-4 grade 1 anterolisthesis due to facet arthropathy. Other neck: Right anterior parotid nodule is increased in size from 2016 measuring 18 x 14 mm (AP by ML series 6, image 84). Upper chest: Status post CABG with LIMA and saphenous grafts, incompletely visualized. Healed upper median sternotomy. Review of the MIP images confirms the above findings CTA HEAD FINDINGS Anterior circulation: No significant stenosis, proximal occlusion, aneurysm, or vascular malformation. Mild non stenotic calcific atherosclerosis of carotid siphons. Posterior circulation: No significant stenosis, proximal occlusion, aneurysm, or vascular malformation. Mild right V4 stenosis. Venous sinuses: As permitted by contrast timing, patent. Anatomic variants: Complete circle-of-Willis. Fetal right PCA. Delayed phase: No abnormal intracranial enhancement. Review of the MIP images confirms the above findings IMPRESSION: 1. Patent carotid and vertebral arteries. No dissection, aneurysm, or hemodynamically significant stenosis utilizing NASCET criteria. 2. Patent anterior and posterior intracranial circulation. No large vessel occlusion, aneurysm, or significant stenosis. 3. Right anterior parotid nodule is increased in size from 2016.  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 If clinically indicated, consider MRI of the face with and without contrast for further evaluation and possible surgical consultation on a nonemergent basis. Electronically Signed   By: Kristine Garbe M.D.   On: 10/18/2018 01:31   Mr Brain Wo Contrast  Result Date: 10/17/2018 CLINICAL DATA:  Unsteady gait and lower extremity weakness. EXAM: MRI HEAD WITHOUT CONTRAST TECHNIQUE: Multiplanar, multiecho pulse sequences of the brain and surrounding structures were obtained without intravenous contrast. COMPARISON:  Head CT 10/17/2018 FINDINGS: BRAIN: Focus of abnormal diffusion restriction within the posterior left corona radiata. No other diffusion abnormality. The midline structures are normal. No midline shift or other mass effect. Old right cerebellar infarct. Old left caudate and left corona radiata infarcts. Mild periventricular white matter hyperintensity. Generalized atrophy without lobar predilection. Susceptibility-sensitive sequences show no chronic microhemorrhage or superficial siderosis. VASCULAR: Major intracranial arterial and venous sinus flow voids are normal. SKULL AND UPPER CERVICAL SPINE: Calvarial bone marrow signal is normal. There is no skull base mass. Visualized upper cervical spine and soft tissues are normal. SINUSES/ORBITS: No fluid levels or advanced mucosal thickening. No mastoid or middle ear effusion. The orbits are normal. IMPRESSION: 1. Small focus of acute ischemia within the posterior left corona radiata. No hemorrhage or mass effect. 2. Findings of chronic small vessel ischemia, including multiple old lacunar infarcts. Electronically Signed   By: Lennette Bihari  [place] M.D.   On: 10/17/2018 21:17    Assessment/Plan: Diagnosis: Acute ischemia within the posterior left corona radiata.   Labs and images (see above) independently reviewed.   Records reviewed and summated above. Stroke: Continue secondary stroke prophylaxis and Risk Factor Modification listed below:   Antiplatelet therapy:   Blood Pressure Management:  Continue current medication with prn's with permisive HTN per primary team Statin Agent:   Diabetes management:    1. Does the need for close, 24 hr/day medical supervision in concert with the patient's rehab needs make it unreasonable for this patient to be served in a less intensive setting? Yes  2. Co-Morbidities requiring supervision/potential complications: CAD with CABG (cont meds), HTN (monitor and provide prns in accordance with increased physical exertion and pain), DM (Monitor in accordance with exercise and adjust meds as necessary), non-Hodgkin's lymphoma, left foot drop (cont AFO), DM (Monitor in accordance with exercise and adjust meds as necessary), CKD II (avoid nephrotoxic meds) 3. Due to safety, disease management and patient education, does the patient require 24 hr/day rehab nursing? Yes 4. Does the patient require coordinated care of a physician, rehab nurse, PT (1-2 hrs/day, 5 days/week) and OT (1-2 hrs/day, 5 days/week) to address physical and functional deficits in the context of the above medical diagnosis(es)? Yes Addressing deficits in the following areas: balance, endurance, locomotion, strength, transferring, bathing, dressing, toileting and psychosocial support 5. Can the patient actively participate in an intensive therapy program of at least 3 hrs of therapy per day at least 5 days per week? Yes 6. The potential for patient to make measurable gains while on inpatient rehab is excellent 7. Anticipated functional outcomes upon discharge from inpatient rehab are modified independent and supervision  with PT, modified independent and supervision with OT, n/a with SLP. 8. Estimated rehab length of stay to reach the above functional goals is: 8-13 days. 9. Anticipated D/C setting:  Home 10. Anticipated post D/C treatments: HH therapy and Home excercise program 11. Overall Rehab/Functional Prognosis: good  RECOMMENDATIONS: This patient's condition is appropriate for continued rehabilitative care in the following setting: CIR Patient has agreed to participate in recommended program. Yes Note that insurance prior authorization may be required for reimbursement for recommended care.  Comment: Rehab Admissions Coordinator to follow up.   I have personally performed a face to face diagnostic evaluation, including, but not limited to relevant history and physical exam findings, of this patient and developed relevant assessment and plan.  Additionally, I have reviewed and concur with the physician assistant's documentation above.   Delice Lesch, MD, ABPMR Lavon Paganini Angiulli, PA-C 10/19/2018

## 2018-10-19 NOTE — Progress Notes (Signed)
Pt  c/o floaters in his visual fields that comes and goes and has had one occurrence this morning. Nurse will reassess pt and notify MD if symptoms persists.

## 2018-10-19 NOTE — Care Management Note (Signed)
Case Management Note  Patient Details  Name: Zachary Spence MRN: [id number] Date of Birth: [birth year]  Subjective/Objective:     Patient admitted with CVA. He is from home with family.               Action/Plan: Pt discharging to CIR today. CM signing off.   Expected Discharge Date:                  Expected Discharge Plan:  [name]  In-House Referral:     Discharge planning Services  CM Consult  Post Acute Care Choice:    Choice offered to:     DME Arranged:    DME Agency:     HH Arranged:    [place] Agency:     Status of Service:  Completed, signed off  If discussed at [employer] of Stay Meetings, dates discussed:    Additional Comments:  Pollie Friar, RN 10/19/2018, 3:10 PM

## 2018-10-19 NOTE — Progress Notes (Signed)
Jamse Arn, MD  Physician  Physical Medicine and Rehabilitation  Consult Note  Addendum  Date of Service:  10/19/2018 7:48 AM       Related encounter: ED to Hosp-Admission (Current) from 10/17/2018 in [place] [facility]      Expand All Collapse All    Show:Clear all [x] Manual[x] Template[] Copied  Added by: [x] Angiulli, Lavon Paganini, PA-C[x] Jamse Arn, MD  [] Hover for details      Physical Medicine and Rehabilitation Consult Reason for Consult: Decreased functional mobility Referring Physician: [facility]   HPI: Zachary Spence is a 84 y.o. right-handed male with history of CAD with CABG 1997, hypertension, diabetes mellitus, non-Hodgkin's lymphoma and left foot drop.  Presented 10/17/2018 with generalized weakness and gait instability.  Per chart review, patient, and son, patient lives with son.  Independent with cane and left AFO brace.  He still drives.  Son works during the day.  One level home with one-step to entry.  Cranial CT scan reviewed, unremarkable for acute intracranial process. 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  Patient did not receive TPA.  MRI of the brain showed small focus of acute ischemia within the posterior left corona radiata.  No hemorrhage or mass-effect.  CT angiogram of head and neck with no dissection or aneurysm.  Echocardiogram with ejection fraction of 60%.  Systolic function normal.  Presently on aspirin and Plavix for CVA prophylaxis.  Subcutaneous heparin for DVT prophylaxis.  Tolerating a regular diet.  Therapy evaluation completed with recommendations of physical medicine rehab consult.   Review of Systems  Constitutional: Negative for chills and fever.  HENT: Negative for hearing loss.   Eyes: Negative for blurred vision and double vision.  Respiratory: Negative for cough and shortness of breath.   Cardiovascular: Negative for chest pain, palpitations and leg swelling.  Gastrointestinal: Positive for  constipation. Negative for nausea and vomiting.  Genitourinary: Positive for urgency. Negative for dysuria, flank pain and hematuria.  Musculoskeletal: Positive for myalgias.  Skin: Negative for rash.  Neurological: Positive for dizziness and weakness. Negative for tremors.  All other systems reviewed and are negative.      Past Medical History:  Diagnosis Date  . Bowel obstruction ([place])   . Diabetes mellitus without complication ([place])   . Hypertension   . Non Hodgkin's lymphoma [facility])         Past Surgical History:  Procedure Laterality Date  . APPENDECTOMY    . CORONARY ARTERY BYPASS GRAFT    . SPLENECTOMY     No pertinent family history of premature CVA. Social History:  reports that he has quit smoking. He has never used smokeless tobacco. He reports that he does not drink alcohol or use drugs. Allergies:       Allergies  Allergen Reactions  . Erythromycin Swelling    Swelling of hands         Medications Prior to Admission  Medication Sig Dispense Refill  . atenolol (TENORMIN) 25 MG tablet Take 25 mg by mouth daily.     . calcium carbonate (CALCIUM 600) 600 MG TABS tablet Take 600 mg by mouth daily.    . folic acid (FOLVITE) 1 MG tablet Take 1 mg by mouth daily.    Marland Kitchen glipiZIDE (GLUCOTROL) 5 MG tablet Take 5 mg by mouth daily.     Marland Kitchen ibuprofen (ADVIL,MOTRIN) 200 MG tablet Take 200 mg by mouth daily as needed for headache (pain).    . Multiple Vitamin (MULTIVITAMIN WITH MINERALS) TABS tablet  Take 1 tablet by mouth daily.    . Multiple Vitamins-Minerals (PRESERVISION AREDS 2 PO) Take 1 capsule by mouth daily.     . Omega-3 Fatty Acids (FISH OIL) 1000 MG CAPS Take 1,000 mg by mouth daily.     . simvastatin (ZOCOR) 40 MG tablet Take 20 mg by mouth See admin instructions. Take 1/2 tablet (20 mg) by mouth every Tuesday, Thursday, Saturday      Home: Home Living Family/patient expects to be discharged to:: Private residence Living  Arrangements: Children Available Help at Discharge: Family, Available PRN/intermittently Type of Home: House Home Access: Stairs to enter Technical brewer of Steps: 1 Entrance Stairs-Rails: None Home Layout: One level Bathroom Shower/Tub: Multimedia programmer: Standard Home Equipment: Radio producer - single point, Civil engineer, contracting - built in, [employer] - tub/shower  Functional History: Prior Function Level of Independence: Independent with assistive device(s) Comments: Pt uses cane and L AFO for ambulation. Reports he was still driving as well.  Functional Status:  Mobility: Bed Mobility Overal bed mobility: Needs Assistance Bed Mobility: Supine to Sit, Sit to Supine Supine to sit: Supervision Sit to supine: Supervision General bed mobility comments: Supervision and increased time to perform bed mobility tasks.  Transfers Overall transfer level: Needs assistance Equipment used: Straight cane Transfers: Sit to/from Stand Sit to Stand: Mod assist General transfer comment: Mod A for lift assist and steadying to come to standing.  Ambulation/Gait Ambulation/Gait assistance: Mod assist Assistive device: Straight cane General Gait Details: Unsteady when performing side steps at EOB. Noted decreased coordination and required mod A for steadying assist.  Gait velocity: Decreased   ADL:  Cognition: Cognition Overall Cognitive Status: Within Functional Limits for tasks assessed Orientation Level: Oriented X4 Cognition Arousal/Alertness: Awake/alert Behavior During Therapy: WFL for tasks assessed/performed Overall Cognitive Status: Within Functional Limits for tasks assessed  Blood pressure 117/71, pulse 71, temperature 97.8 F (36.6 C), temperature source Oral, resp. rate 20, height 6' (1.829 m), weight 84.4 kg, SpO2 95 %. Physical Exam  Vitals reviewed. Constitutional: He is oriented to person, place, and time. He appears well-developed and well-nourished.  Fair sitting  balance  HENT:  Head: Normocephalic and atraumatic.  Eyes: EOM are normal. Right eye exhibits no discharge. Left eye exhibits no discharge.  Neck: Normal range of motion. Neck supple. No thyromegaly present.  Cardiovascular: Normal rate and regular rhythm.  Murmur heard. Respiratory: Effort normal and breath sounds normal. No respiratory distress.  GI: Soft. Bowel sounds are normal. He exhibits no distension.  Neurological: He is alert and oriented to person, place, and time.  Follows full commands Motor: Grossly 4-4+/5 throughout, except left ankle dorsiflexion 0/5 Sensation intact to light touch No ataxia b/l UE  Skin: Skin is warm and dry.  Psychiatric: He has a normal mood and affect. His behavior is normal.    LabResultsLast24Hours       Results for orders placed or performed during the hospital encounter of 10/17/18 (from the past 24 hour(s))  CBG monitoring, ED     Status: Abnormal   Collection Time: 10/18/18 11:46 AM  Result Value Ref Range   Glucose-Capillary 109 (H) 70 - 99 mg/dL   Comment 1 Notify RN    Comment 2 Document in Chart   Glucose, capillary     Status: None   Collection Time: 10/18/18  6:07 PM  Result Value Ref Range   Glucose-Capillary 80 70 - 99 mg/dL   Comment 1 Notify RN    Comment 2 Document in  Chart   Glucose, capillary     Status: Abnormal   Collection Time: 10/18/18  9:14 PM  Result Value Ref Range   Glucose-Capillary 120 (H) 70 - 99 mg/dL   Comment 1 Notify RN    Comment 2 Document in Chart   Glucose, capillary     Status: Abnormal   Collection Time: 10/19/18  6:32 AM  Result Value Ref Range   Glucose-Capillary 109 (H) 70 - 99 mg/dL   Comment 1 Notify RN    Comment 2 Document in Chart       ImagingResults(Last48hours)  Ct Angio Head W Or Wo Contrast  Result Date: 10/18/2018 CLINICAL DATA:  84 y/o  M; stroke for follow-up. EXAM: CT ANGIOGRAPHY HEAD AND NECK TECHNIQUE: Multidetector CT imaging of the  head and neck was performed using the standard protocol during bolus administration of intravenous contrast. Multiplanar CT image reconstructions and MIPs were obtained to evaluate the vascular anatomy. Carotid stenosis measurements (when applicable) are obtained utilizing NASCET criteria, using the distal internal carotid diameter as the denominator. CONTRAST:  50 cc Isovue 370 COMPARISON:  10/17/2018 MRI and CT of the head. 11/18/2015 CT head. FINDINGS: CTA NECK FINDINGS Aortic arch: Bovine variant branching. Imaged portion shows no evidence of aneurysm or dissection. No significant stenosis of the major arch vessel origins. Atherosclerosis with mixed plaque. Right carotid system: No evidence of dissection, stenosis (50% or greater) or occlusion. Mild non stenotic calcific atherosclerosis of carotid bifurcation. Left carotid system: No evidence of dissection, stenosis (50% or greater) or occlusion. Vertebral arteries: Left dominant. No evidence of dissection, stenosis (50% or greater) or occlusion. Skeleton: Mild-to-moderate cervical spondylosis with multilevel disc and facet degenerative changes. No high-grade bony canal stenosis. C3-4 grade 1 anterolisthesis due to facet arthropathy. Other neck: Right anterior parotid nodule is increased in size from 2016 measuring 18 x 14 mm (AP by ML series 6, image 84). Upper chest: Status post CABG with LIMA and saphenous grafts, incompletely visualized. Healed upper median sternotomy. Review of the MIP images confirms the above findings CTA HEAD FINDINGS Anterior circulation: No significant stenosis, proximal occlusion, aneurysm, or vascular malformation. Mild non stenotic calcific atherosclerosis of carotid siphons. Posterior circulation: No significant stenosis, proximal occlusion, aneurysm, or vascular malformation. Mild right V4 stenosis. Venous sinuses: As permitted by contrast timing, patent. Anatomic variants: Complete circle-of-Willis. Fetal right PCA. Delayed  phase: No abnormal intracranial enhancement. Review of the MIP images confirms the above findings IMPRESSION: 1. Patent carotid and vertebral arteries. No dissection, aneurysm, or hemodynamically significant stenosis utilizing NASCET criteria. 2. Patent anterior and posterior intracranial circulation. No large vessel occlusion, aneurysm, or significant stenosis. 3. Right anterior parotid nodule is increased in size from 2016. 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 If clinically indicated, consider MRI of the face with and without contrast for further evaluation and possible surgical consultation on a nonemergent basis. Electronically Signed   By: Kristine Garbe M.D.   On: 10/18/2018 01:31   Ct Head Wo Contrast  Result Date: 10/17/2018 CLINICAL DATA:  Unsteady gait. Leg weakness. Symptoms since this morning. EXAM: CT HEAD WITHOUT CONTRAST TECHNIQUE: Contiguous axial images were obtained from the base of the skull through the vertex without intravenous contrast. COMPARISON:  11/18/2015 FINDINGS: Brain: No evidence of acute infarction, hemorrhage, hydrocephalus, extra-axial collection or mass lesion/mass effect. There is mild ventricular and sulcal enlargement reflecting age-appropriate volume loss. Small deep white matter lacunar infarct  in the left parietal lobe. Small old lacunar infarct in the left caudate nucleus head. Small old lacunar infarct, right cerebellum. Vascular: No hyperdense vessel or unexpected calcification. Skull: Normal. Negative for fracture or focal lesion. Sinuses/Orbits: Globes and orbits are unremarkable. The visualized sinuses and mastoid air cells are clear. Other: None. IMPRESSION: 1. No acute intracranial abnormalities. 2. Age-appropriate volume loss.  Three small old lacunar infarcts. Electronically Signed   By: Lajean Manes M.D.   On: 10/17/2018 18:39   Ct Angio Neck W  Or Wo Contrast  Result Date: 10/18/2018 CLINICAL DATA:  84 y/o  M; stroke for follow-up. EXAM: CT ANGIOGRAPHY HEAD AND NECK TECHNIQUE: Multidetector CT imaging of the head and neck was performed using the standard protocol during bolus administration of intravenous contrast. Multiplanar CT image reconstructions and MIPs were obtained to evaluate the vascular anatomy. Carotid stenosis measurements (when applicable) are obtained utilizing NASCET criteria, using the distal internal carotid diameter as the denominator. CONTRAST:  50 cc Isovue 370 COMPARISON:  10/17/2018 MRI and CT of the head. 11/18/2015 CT head. FINDINGS: CTA NECK FINDINGS Aortic arch: Bovine variant branching. Imaged portion shows no evidence of aneurysm or dissection. No significant stenosis of the major arch vessel origins. Atherosclerosis with mixed plaque. Right carotid system: No evidence of dissection, stenosis (50% or greater) or occlusion. Mild non stenotic calcific atherosclerosis of carotid bifurcation. Left carotid system: No evidence of dissection, stenosis (50% or greater) or occlusion. Vertebral arteries: Left dominant. No evidence of dissection, stenosis (50% or greater) or occlusion. Skeleton: Mild-to-moderate cervical spondylosis with multilevel disc and facet degenerative changes. No high-grade bony canal stenosis. C3-4 grade 1 anterolisthesis due to facet arthropathy. Other neck: Right anterior parotid nodule is increased in size from 2016 measuring 18 x 14 mm (AP by ML series 6, image 84). Upper chest: Status post CABG with LIMA and saphenous grafts, incompletely visualized. Healed upper median sternotomy. Review of the MIP images confirms the above findings CTA HEAD FINDINGS Anterior circulation: No significant stenosis, proximal occlusion, aneurysm, or vascular malformation. Mild non stenotic calcific atherosclerosis of carotid siphons. Posterior circulation: No significant stenosis, proximal occlusion, aneurysm, or vascular  malformation. Mild right V4 stenosis. Venous sinuses: As permitted by contrast timing, patent. Anatomic variants: Complete circle-of-Willis. Fetal right PCA. Delayed phase: No abnormal intracranial enhancement. Review of the MIP images confirms the above findings IMPRESSION: 1. Patent carotid and vertebral arteries. No dissection, aneurysm, or hemodynamically significant stenosis utilizing NASCET criteria. 2. Patent anterior and posterior intracranial circulation. No large vessel occlusion, aneurysm, or significant stenosis. 3. Right anterior parotid nodule is increased in size from 2016. 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 If clinically indicated, consider MRI of the face with and without contrast for further evaluation and possible surgical consultation on a nonemergent basis. Electronically Signed   By: Kristine Garbe M.D.   On: 10/18/2018 01:31   Mr Brain Wo Contrast  Result Date: 10/17/2018 CLINICAL DATA:  Unsteady gait and lower extremity weakness. EXAM: MRI HEAD WITHOUT CONTRAST TECHNIQUE: Multiplanar, multiecho pulse sequences of the brain and surrounding structures were obtained without intravenous contrast. COMPARISON:  Head CT 10/17/2018 FINDINGS: BRAIN: Focus of abnormal diffusion restriction within the posterior left corona radiata. No other diffusion abnormality. The midline structures are normal. No midline shift or other mass effect. Old right cerebellar infarct. Old left caudate and left corona radiata infarcts. Mild periventricular white matter hyperintensity. Generalized atrophy without lobar  predilection. Susceptibility-sensitive sequences show no chronic microhemorrhage or superficial siderosis. VASCULAR: Major intracranial arterial and venous sinus flow voids are normal. SKULL AND UPPER CERVICAL SPINE: Calvarial bone marrow signal is normal. There is no skull base mass.  Visualized upper cervical spine and soft tissues are normal. SINUSES/ORBITS: No fluid levels or advanced mucosal thickening. No mastoid or middle ear effusion. The orbits are normal. IMPRESSION: 1. Small focus of acute ischemia within the posterior left corona radiata. No hemorrhage or mass effect. 2. Findings of chronic small vessel ischemia, including multiple old lacunar infarcts. Electronically Signed   By: Ulyses Jarred M.D.   On: 10/17/2018 21:17     Assessment/Plan: Diagnosis: Acute ischemia within the posterior left corona radiata.   Labs and images (see above) independently reviewed.  Records reviewed and summated above. Stroke: Continue secondary stroke prophylaxis and Risk Factor Modification listed below:   Antiplatelet therapy:   Blood Pressure Management:  Continue current medication with prn's with permisive HTN per primary team Statin Agent:   Diabetes management:    1. Does the need for close, 24 hr/day medical supervision in concert with the patient's rehab needs make it unreasonable for this patient to be served in a less intensive setting? Yes  2. Co-Morbidities requiring supervision/potential complications: CAD with CABG (cont meds), HTN (monitor and provide prns in accordance with increased physical exertion and pain), DM (Monitor in accordance with exercise and adjust meds as necessary), non-Hodgkin's lymphoma, left foot drop (cont AFO), DM (Monitor in accordance with exercise and adjust meds as necessary), CKD II (avoid nephrotoxic meds) 3. Due to safety, disease management and patient education, does the patient require 24 hr/day rehab nursing? Yes 4. Does the patient require coordinated care of a physician, rehab nurse, PT (1-2 hrs/day, 5 days/week) and OT (1-2 hrs/day, 5 days/week) to address physical and functional deficits in the context of the above medical diagnosis(es)? Yes Addressing deficits in the following areas: balance, endurance, locomotion, strength,  transferring, bathing, dressing, toileting and psychosocial support 5. Can the patient actively participate in an intensive therapy program of at least 3 hrs of therapy per day at least 5 days per week? Yes 6. The potential for patient to make measurable gains while on inpatient rehab is excellent 7. Anticipated functional outcomes upon discharge from inpatient rehab are modified independent and supervision  with PT, modified independent and supervision with OT, n/a with SLP. 8. Estimated rehab length of stay to reach the above functional goals is: 8-13 days. 9. Anticipated D/C setting: Home 10. Anticipated post D/C treatments: HH therapy and Home excercise program 11. Overall Rehab/Functional Prognosis: good  RECOMMENDATIONS: This patient's condition is appropriate for continued rehabilitative care in the following setting: CIR Patient has agreed to participate in recommended program. Yes Note that insurance prior authorization may be required for reimbursement for recommended care.  Comment: Rehab Admissions Coordinator to follow up.   I have personally performed a face to face diagnostic evaluation, including, but not limited to relevant history and physical exam findings, of this patient and developed relevant assessment and plan.  Additionally, I have reviewed and concur with the physician assistant's documentation above.   Delice Lesch, MD, ABPMR Lavon Paganini Angiulli, PA-C 10/19/2018    Revision History                             Routing History

## 2018-10-19 NOTE — H&P (Signed)
Physical Medicine and Rehabilitation Admission H&P        Chief Complaint  Patient presents with  . Weakness  : HPI: Zachary Spence is a 84 year old right-handed male history of CAD with CABG in 1997, hypertension, diabetes mellitus, non-Hodgkin's lymphoma and left foot drop.  Presented 10/17/2018 with generalized weakness and gait instability read per chart review, patient, and son, patient lives with son.  Independent with a cane and left AFO brace.  He still drives.  Son works during the day.  One level home with one-step to entry.  Cranial CT scan reviewed, unremarkable for acute intracranial process.  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  Patient did not receive TPA.  MRI of the brain showed small focus of acute ischemia within the posterior left corona radiata.  No hemorrhage or mass-effect.  CT angiogram of head and neck with no dissection or aneurysm.  Echocardiogram with ejection fraction of 60%.  Systolic function was normal.  Presently on aspirin and Plavix for CVA prophylaxis.  Subcutaneous heparin for DVT prophylaxis.  Tolerating a regular diet.  Therapy evaluations completed with recommendations of physical medicine rehab consult.  Patient was admitted for a comprehensive rehab program.   Review of Systems  Constitutional: Negative for chills and fever.  HENT: Negative for hearing loss.   Eyes: Negative for blurred vision.  Respiratory: Negative for shortness of breath.   Cardiovascular: Negative for chest pain, palpitations and leg swelling.  Gastrointestinal: Positive for constipation. Negative for nausea and vomiting.  Genitourinary: Positive for urgency. Negative for flank pain and hematuria.  Musculoskeletal: Positive for joint pain and myalgias.  Skin: Negative for rash.  Neurological: Positive for dizziness and weakness.  All other systems reviewed and are negative.       Past Medical History:  Diagnosis Date  . Bowel obstruction ([place])    . Diabetes  mellitus without complication ([place])    . Hypertension    . Non Hodgkin's lymphoma [facility])           Past Surgical History:  Procedure Laterality Date  . APPENDECTOMY      . CORONARY ARTERY BYPASS GRAFT      . SPLENECTOMY        No family history on file. Social History:  reports that he has quit smoking. He has never used smokeless tobacco. He reports that he does not drink alcohol or use drugs. Allergies:       Allergies  Allergen Reactions  . Erythromycin Swelling      Swelling of hands          Medications Prior to Admission  Medication Sig Dispense Refill  . atenolol (TENORMIN) 25 MG tablet Take 25 mg by mouth daily.       . calcium carbonate (CALCIUM 600) 600 MG TABS tablet Take 600 mg by mouth daily.      . folic acid (FOLVITE) 1 MG tablet Take 1 mg by mouth daily.      Marland Kitchen glipiZIDE (GLUCOTROL) 5 MG tablet Take 5 mg by mouth daily.       Marland Kitchen ibuprofen (ADVIL,MOTRIN) 200 MG tablet Take 200 mg by mouth daily as needed for headache (pain).      . Multiple Vitamin (MULTIVITAMIN WITH MINERALS) TABS tablet Take 1 tablet by mouth daily.      . Multiple Vitamins-Minerals (PRESERVISION AREDS 2 PO) Take 1 capsule by mouth daily.       . Omega-3 Fatty Acids (FISH OIL) 1000  MG CAPS Take 1,000 mg by mouth daily.       . simvastatin (ZOCOR) 40 MG tablet Take 20 mg by mouth See admin instructions. Take 1/2 tablet (20 mg) by mouth every Tuesday, Thursday, Saturday          Drug Regimen Review Drug regimen was reviewed and remains appropriate with no significant issues identified   Home: Home Living Family/patient expects to be discharged to:: Private residence Living Arrangements: Children Available Help at Discharge: Family, Available PRN/intermittently Type of Home: House Home Access: Stairs to enter Technical brewer of Steps: 1 Entrance Stairs-Rails: None Home Layout: One level Bathroom Shower/Tub: Multimedia programmer: Handicapped height Home Equipment: Radio producer -  single point, Civil engineer, contracting - built in, [employer] - tub/shower   Functional History: Prior Function Level of Independence: Independent with assistive device(s) Comments: Pt uses cane and L AFO for ambulation. Reports he was still driving as well. Working out at gym several days a week   Functional Status:  Mobility: Bed Mobility Overal bed mobility: Needs Assistance Bed Mobility: Supine to Sit, Sit to Supine Supine to sit: Supervision Sit to supine: Supervision General bed mobility comments: Supervision and increased time to perform bed mobility tasks.  Transfers Overall transfer level: Needs assistance Equipment used: Rolling walker (2 wheeled), Straight cane Transfers: Sit to/from Stand Sit to Stand: Min assist General transfer comment: Mod A for lift assist and steadying to come to standing.  Ambulation/Gait Ambulation/Gait assistance: Mod assist Assistive device: Straight cane General Gait Details: Unsteady when performing side steps at EOB. Noted decreased coordination and required mod A for steadying assist.  Gait velocity: Decreased    ADL: ADL Overall ADL's : Needs assistance/impaired Grooming: Standing, Min guard Upper Body Bathing: Set up, Sitting Lower Body Bathing: Minimal assistance, Sit to/from stand Upper Body Dressing : Set up, Sitting Lower Body Dressing: Minimal assistance, Sit to/from stand Toilet Transfer: RW, Minimal assistance, Grab bars Toileting- Clothing Manipulation and Hygiene: Minimal assistance, Sit to/from stand General ADL Comments: min A overall with use of RW    Cognition: Cognition Overall Cognitive Status: Within Functional Limits for tasks assessed Orientation Level: Oriented X4 Cognition Arousal/Alertness: Awake/alert Behavior During Therapy: WFL for tasks assessed/performed Overall Cognitive Status: Within Functional Limits for tasks assessed   Physical Exam: Blood pressure 117/71, pulse 71, temperature 97.8 F (36.6 C),  temperature source Oral, resp. rate 20, height 6' (1.829 m), weight 84.4 kg, SpO2 95 %. Physical Exam  Vitals reviewed. Constitutional: He is oriented to person, place, and time. He appears well-developed and well-nourished.  HENT:  Head: Normocephalic and atraumatic.  Eyes: Pupils are equal, round, and reactive to light. EOM are normal.  Neck: Normal range of motion.  Cardiovascular: Normal rate and regular rhythm. Exam reveals no friction rub.  Murmur heard. Respiratory: Effort normal and breath sounds normal. No respiratory distress. He has no wheezes.  GI: Soft. He exhibits no distension. There is no tenderness.  Musculoskeletal: Normal range of motion. He exhibits no edema or deformity.  Neurological: He is alert and oriented to person, place, and time.  Alert.  Sitting edge of bed.  Follows full commands.  Makes good eye contact with examiner.  Provides his name age and date of birth.  Fair awareness of deficits. UE strength 4-/5 RUE, 4/5 LUE. RLE 4-/5. LLE 4/5 except for ADF which is 3-/5. AFO in place  Skin:  A few scattered abrasions on legs  Psychiatric: He has a normal mood and affect. His behavior  is normal.      Lab Results Last 48 Hours        Results for orders placed or performed during the hospital encounter of 10/17/18 (from the past 48 hour(s))  Protime-INR     Status: None    Collection Time: 10/17/18  6:13 PM  Result Value Ref Range    Prothrombin Time 13.1 11.4 - 15.2 seconds    INR 1.00        Comment: Performed at [facility] [address], [place], [place] [address]  APTT     Status: None    Collection Time: 10/17/18  6:13 PM  Result Value Ref Range    aPTT 29 24 - 36 seconds      Comment: Performed at [facility] [address], [place] [address]  CBC     Status: None    Collection Time: 10/17/18  6:13 PM  Result Value Ref Range    WBC 7.5 4.0 - 10.5 K/uL    RBC 4.85 4.22 - 5.81 MIL/uL    Hemoglobin 14.5 13.0 - 17.0 g/dL     HCT 47.2 39.0 - 52.0 %    MCV 97.3 80.0 - 100.0 fL    MCH 29.9 26.0 - 34.0 pg    MCHC 30.7 30.0 - 36.0 g/dL    RDW 14.3 11.5 - 15.5 %    Platelets 312 150 - 400 K/uL    nRBC 0.0 0.0 - 0.2 %      Comment: Performed at [facility], [place] [address], [place], [place] [address]  Differential     Status: None    Collection Time: 10/17/18  6:13 PM  Result Value Ref Range    Neutrophils Relative % 47 %    Neutro Abs 3.6 1.7 - 7.7 K/uL    Lymphocytes Relative 39 %    Lymphs Abs 2.9 0.7 - 4.0 K/uL    Monocytes Relative 11 %    Monocytes Absolute 0.8 0.1 - 1.0 K/uL    Eosinophils Relative 2 %    Eosinophils Absolute 0.1 0.0 - 0.5 K/uL    Basophils Relative 1 %    Basophils Absolute 0.1 0.0 - 0.1 K/uL    Immature Granulocytes 0 %    Abs Immature Granulocytes 0.02 0.00 - 0.07 K/uL      Comment: Performed at [facility] [address] [place], [place] [address]  Comprehensive metabolic panel     Status: Abnormal    Collection Time: 10/17/18  6:13 PM  Result Value Ref Range    Sodium 140 135 - 145 mmol/L    Potassium 4.3 3.5 - 5.1 mmol/L    Chloride 109 98 - 111 mmol/L    CO2 23 22 - 32 mmol/L    Glucose, Bld 101 (H) 70 - 99 mg/dL    BUN 24 (H) 8 - 23 mg/dL    Creatinine, Ser 1.13 0.61 - 1.24 mg/dL    Calcium 9.4 8.9 - 10.3 mg/dL    Total Protein 7.1 6.5 - 8.1 g/dL    Albumin 4.0 3.5 - 5.0 g/dL    AST 25 15 - 41 U/L    ALT 21 0 - 44 U/L    Alkaline Phosphatase 53 38 - 126 U/L    Total Bilirubin 0.6 0.3 - 1.2 mg/dL    GFR calc non Af Amer 54 (L) >60 mL/min    GFR calc Af Amer >60 >60 mL/min  Comment: (NOTE) The eGFR has been calculated using the CKD EPI equation. This calculation has not been validated in all clinical situations. eGFR's persistently <60 mL/min signify possible Chronic Kidney Disease.      Anion gap 8 5 - 15      Comment: Performed at [facility] [address] [place], [place] [address]  I-stat troponin, ED     Status: None     Collection Time: 10/17/18  6:16 PM  Result Value Ref Range    Troponin i, poc 0.01 0.00 - 0.08 ng/mL    Comment 3               Comment: Due to the release kinetics of cTnI, a negative result within the first hours of the onset of symptoms does not rule out myocardial infarction with certainty. If myocardial infarction is still suspected, repeat the test at appropriate intervals.    CBG monitoring, ED     Status: Abnormal    Collection Time: 10/18/18 12:10 AM  Result Value Ref Range    Glucose-Capillary 110 (H) 70 - 99 mg/dL    Comment 1 Notify RN    CBC     Status: None    Collection Time: 10/18/18 12:16 AM  Result Value Ref Range    WBC 8.1 4.0 - 10.5 K/uL    RBC 4.70 4.22 - 5.81 MIL/uL    Hemoglobin 14.4 13.0 - 17.0 g/dL    HCT 44.7 39.0 - 52.0 %    MCV 95.1 80.0 - 100.0 fL    MCH 30.6 26.0 - 34.0 pg    MCHC 32.2 30.0 - 36.0 g/dL    RDW 14.2 11.5 - 15.5 %    Platelets 283 150 - 400 K/uL    nRBC 0.0 0.0 - 0.2 %      Comment: Performed at [facility], [place] [address], [place], [place] [address]  Creatinine, serum     Status: Abnormal    Collection Time: 10/18/18 12:16 AM  Result Value Ref Range    Creatinine, Ser 1.12 0.61 - 1.24 mg/dL    GFR calc non Af Amer 55 (L) >60 mL/min    GFR calc Af Amer >60 >60 mL/min      Comment: (NOTE) The eGFR has been calculated using the CKD EPI equation. This calculation has not been validated in all clinical situations. eGFR's persistently <60 mL/min signify possible Chronic Kidney Disease. Performed at [facility], [place] [address], [place], [place] [address]    Lipid panel     Status: Abnormal    Collection Time: 10/18/18  3:13 AM  Result Value Ref Range    Cholesterol 193 0 - 200 mg/dL    Triglycerides 112 <150 mg/dL    HDL 39 (L) >40 mg/dL    Total CHOL/HDL Ratio 4.9 RATIO    VLDL 22 0 - 40 mg/dL    LDL Cholesterol 132 (H) 0 - 99 mg/dL      Comment:        Total Cholesterol/HDL:CHD Risk Coronary Heart Disease  Risk Table                     Men   Women  1/2 Average Risk   3.4   3.3  Average Risk       5.0   4.4  2 X Average Risk   9.6   7.1  3 X Average Risk  23.4   11.0  Use the calculated Patient Ratio above and the CHD Risk Table to determine the patient's CHD Risk.        ATP III CLASSIFICATION (LDL):  <100     mg/dL   Optimal  100-129  mg/dL   Near or Above                    Optimal  130-159  mg/dL   Borderline  160-189  mg/dL   High  >190     mg/dL   Very High Performed at [facility] [address], [place], [place] [address]    Hemoglobin A1c     Status: Abnormal    Collection Time: 10/18/18  3:13 AM  Result Value Ref Range    Hgb A1c MFr Bld 6.5 (H) 4.8 - 5.6 %      Comment: (NOTE) Pre diabetes:          5.7%-6.4% Diabetes:              >6.4% Glycemic control for   <7.0% adults with diabetes      Mean Plasma Glucose 139.85 mg/dL      Comment: Performed at [facility] [address] [place], [place] [address]  Comprehensive metabolic panel     Status: Abnormal    Collection Time: 10/18/18  3:13 AM  Result Value Ref Range    Sodium 141 135 - 145 mmol/L    Potassium 3.8 3.5 - 5.1 mmol/L    Chloride 109 98 - 111 mmol/L    CO2 22 22 - 32 mmol/L    Glucose, Bld 94 70 - 99 mg/dL    BUN 21 8 - 23 mg/dL    Creatinine, Ser 1.06 0.61 - 1.24 mg/dL    Calcium 8.9 8.9 - 10.3 mg/dL    Total Protein 6.3 (L) 6.5 - 8.1 g/dL    Albumin 3.7 3.5 - 5.0 g/dL    AST 22 15 - 41 U/L    ALT 18 0 - 44 U/L    Alkaline Phosphatase 48 38 - 126 U/L    Total Bilirubin 0.9 0.3 - 1.2 mg/dL    GFR calc non Af Amer 58 (L) >60 mL/min    GFR calc Af Amer >60 >60 mL/min      Comment: (NOTE) The eGFR has been calculated using the CKD EPI equation. This calculation has not been validated in all clinical situations. eGFR's persistently <60 mL/min signify possible Chronic Kidney Disease.      Anion gap 10 5 - 15      Comment: Performed at [facility] [address] [place] [address]  CBC     Status: None    Collection Time: 10/18/18  3:13 AM  Result Value Ref Range    WBC 8.1 4.0 - 10.5 K/uL    RBC 4.49 4.22 - 5.81 MIL/uL    Hemoglobin 13.8 13.0 - 17.0 g/dL    HCT 43.4 39.0 - 52.0 %    MCV 96.7 80.0 - 100.0 fL    MCH 30.7 26.0 - 34.0 pg    MCHC 31.8 30.0 - 36.0 g/dL    RDW 14.4 11.5 - 15.5 %    Platelets 281 150 - 400 K/uL    nRBC 0.0 0.0 - 0.2 %      Comment: Performed at [facility], [place] [address], [place], [place] [address]  CBG monitoring, ED     Status:  Abnormal    Collection Time: 10/18/18  7:45 AM  Result Value Ref Range    Glucose-Capillary 112 (H) 70 - 99 mg/dL    Comment 1 Notify RN      Comment 2 Document in Chart    CBG monitoring, ED     Status: Abnormal    Collection Time: 10/18/18 11:46 AM  Result Value Ref Range    Glucose-Capillary 109 (H) 70 - 99 mg/dL    Comment 1 Notify RN      Comment 2 Document in Chart    Glucose, capillary     Status: None    Collection Time: 10/18/18  6:07 PM  Result Value Ref Range    Glucose-Capillary 80 70 - 99 mg/dL    Comment 1 Notify RN      Comment 2 Document in Chart    Glucose, capillary     Status: Abnormal    Collection Time: 10/18/18  9:14 PM  Result Value Ref Range    Glucose-Capillary 120 (H) 70 - 99 mg/dL    Comment 1 Notify RN      Comment 2 Document in Chart    Glucose, capillary     Status: Abnormal    Collection Time: 10/19/18  6:32 AM  Result Value Ref Range    Glucose-Capillary 109 (H) 70 - 99 mg/dL    Comment 1 Notify RN      Comment 2 Document in Chart         Imaging Results (Last 48 hours)  Ct Angio Head W Or Wo Contrast   Result Date: 10/18/2018 CLINICAL DATA:  84 y/o  M; stroke for follow-up. EXAM: CT ANGIOGRAPHY HEAD AND NECK TECHNIQUE: Multidetector CT imaging of the head and neck was performed using the standard protocol during bolus administration of intravenous contrast. Multiplanar CT image reconstructions and MIPs were obtained to  evaluate the vascular anatomy. Carotid stenosis measurements (when applicable) are obtained utilizing NASCET criteria, using the distal internal carotid diameter as the denominator. CONTRAST:  50 cc Isovue 370 COMPARISON:  10/17/2018 MRI and CT of the head. 11/18/2015 CT head. FINDINGS: CTA NECK FINDINGS Aortic arch: Bovine variant branching. Imaged portion shows no evidence of aneurysm or dissection. No significant stenosis of the major arch vessel origins. Atherosclerosis with mixed plaque. Right carotid system: No evidence of dissection, stenosis (50% or greater) or occlusion. Mild non stenotic calcific atherosclerosis of carotid bifurcation. Left carotid system: No evidence of dissection, stenosis (50% or greater) or occlusion. Vertebral arteries: Left dominant. No evidence of dissection, stenosis (50% or greater) or occlusion. Skeleton: Mild-to-moderate cervical spondylosis with multilevel disc and facet degenerative changes. No high-grade bony canal stenosis. C3-4 grade 1 anterolisthesis due to facet arthropathy. Other neck: Right anterior parotid nodule is increased in size from 2016 measuring 18 x 14 mm (AP by ML series 6, image 84). Upper chest: Status post CABG with LIMA and saphenous grafts, incompletely visualized. Healed upper median sternotomy. Review of the MIP images confirms the above findings CTA HEAD FINDINGS Anterior circulation: No significant stenosis, proximal occlusion, aneurysm, or vascular malformation. Mild non stenotic calcific atherosclerosis of carotid siphons. Posterior circulation: No significant stenosis, proximal occlusion, aneurysm, or vascular malformation. Mild right V4 stenosis. Venous sinuses: As permitted by contrast timing, patent. Anatomic variants: Complete circle-of-Willis. Fetal right PCA. Delayed phase: No abnormal intracranial enhancement. Review of the MIP images confirms the above findings IMPRESSION: 1. Patent carotid and vertebral arteries. No dissection, aneurysm,  or hemodynamically significant stenosis utilizing NASCET criteria.  2. Patent anterior and posterior intracranial circulation. No large vessel occlusion, aneurysm, or significant stenosis. 3. Right anterior parotid nodule is increased in size from 2016. 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 If clinically indicated, consider MRI of the face with and without contrast for further evaluation and possible surgical consultation on a nonemergent basis. Electronically Signed   By: Kristine Garbe M.D.   On: 10/18/2018 01:31    Ct Head Wo Contrast   Result Date: 10/17/2018 CLINICAL DATA:  Unsteady gait. Leg weakness. Symptoms since this morning. EXAM: CT HEAD WITHOUT CONTRAST TECHNIQUE: Contiguous axial images were obtained from the base of the skull through the vertex without intravenous contrast. COMPARISON:  11/18/2015 FINDINGS: Brain: No evidence of acute infarction, hemorrhage, hydrocephalus, extra-axial collection or mass lesion/mass effect. There is mild ventricular and sulcal enlargement reflecting age-appropriate volume loss. Small deep white matter lacunar infarct in the left parietal lobe. Small old lacunar infarct in the left caudate nucleus head. Small old lacunar infarct, right cerebellum. Vascular: No hyperdense vessel or unexpected calcification. Skull: Normal. Negative for fracture or focal lesion. Sinuses/Orbits: Globes and orbits are unremarkable. The visualized sinuses and mastoid air cells are clear. Other: None. IMPRESSION: 1. No acute intracranial abnormalities. 2. Age-appropriate volume loss.  Three small old lacunar infarcts. Electronically Signed   By: Lajean Manes M.D.   On: 10/17/2018 18:39    Ct Angio Neck W Or Wo Contrast   Result Date: 10/18/2018 CLINICAL DATA:  84 y/o  M; stroke for follow-up. EXAM: CT ANGIOGRAPHY HEAD AND NECK TECHNIQUE: Multidetector CT imaging of the head  and neck was performed using the standard protocol during bolus administration of intravenous contrast. Multiplanar CT image reconstructions and MIPs were obtained to evaluate the vascular anatomy. Carotid stenosis measurements (when applicable) are obtained utilizing NASCET criteria, using the distal internal carotid diameter as the denominator. CONTRAST:  50 cc Isovue 370 COMPARISON:  10/17/2018 MRI and CT of the head. 11/18/2015 CT head. FINDINGS: CTA NECK FINDINGS Aortic arch: Bovine variant branching. Imaged portion shows no evidence of aneurysm or dissection. No significant stenosis of the major arch vessel origins. Atherosclerosis with mixed plaque. Right carotid system: No evidence of dissection, stenosis (50% or greater) or occlusion. Mild non stenotic calcific atherosclerosis of carotid bifurcation. Left carotid system: No evidence of dissection, stenosis (50% or greater) or occlusion. Vertebral arteries: Left dominant. No evidence of dissection, stenosis (50% or greater) or occlusion. Skeleton: Mild-to-moderate cervical spondylosis with multilevel disc and facet degenerative changes. No high-grade bony canal stenosis. C3-4 grade 1 anterolisthesis due to facet arthropathy. Other neck: Right anterior parotid nodule is increased in size from 2016 measuring 18 x 14 mm (AP by ML series 6, image 84). Upper chest: Status post CABG with LIMA and saphenous grafts, incompletely visualized. Healed upper median sternotomy. Review of the MIP images confirms the above findings CTA HEAD FINDINGS Anterior circulation: No significant stenosis, proximal occlusion, aneurysm, or vascular malformation. Mild non stenotic calcific atherosclerosis of carotid siphons. Posterior circulation: No significant stenosis, proximal occlusion, aneurysm, or vascular malformation. Mild right V4 stenosis. Venous sinuses: As permitted by contrast timing, patent. Anatomic variants: Complete circle-of-Willis. Fetal right PCA. Delayed phase: No  abnormal intracranial enhancement. Review of the MIP images confirms the above findings IMPRESSION: 1. Patent carotid and vertebral arteries. No dissection, aneurysm, or hemodynamically significant stenosis utilizing NASCET criteria. 2. Patent anterior and posterior intracranial circulation. No large vessel occlusion, aneurysm,  or significant stenosis. 3. Right anterior parotid nodule is increased in size from 2016. 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 If clinically indicated, consider MRI of the face with and without contrast for further evaluation and possible surgical consultation on a nonemergent basis. Electronically Signed   By: Kristine Garbe M.D.   On: 10/18/2018 01:31    Mr Brain Wo Contrast   Result Date: 10/17/2018 CLINICAL DATA:  Unsteady gait and lower extremity weakness. EXAM: MRI HEAD WITHOUT CONTRAST TECHNIQUE: Multiplanar, multiecho pulse sequences of the brain and surrounding structures were obtained without intravenous contrast. COMPARISON:  Head CT 10/17/2018 FINDINGS: BRAIN: Focus of abnormal diffusion restriction within the posterior left corona radiata. No other diffusion abnormality. The midline structures are normal. No midline shift or other mass effect. Old right cerebellar infarct. Old left caudate and left corona radiata infarcts. Mild periventricular white matter hyperintensity. Generalized atrophy without lobar predilection. Susceptibility-sensitive sequences show no chronic microhemorrhage or superficial siderosis. VASCULAR: Major intracranial arterial and venous sinus flow voids are normal. SKULL AND UPPER CERVICAL SPINE: Calvarial bone marrow signal is normal. There is no skull base mass. Visualized upper cervical spine and soft tissues are normal. SINUSES/ORBITS: No fluid levels or advanced mucosal thickening. No mastoid or middle ear effusion. The orbits are normal.  IMPRESSION: 1. Small focus of acute ischemia within the posterior left corona radiata. No hemorrhage or mass effect. 2. Findings of chronic small vessel ischemia, including multiple old lacunar infarcts. Electronically Signed   By: Ulyses Jarred M.D.   On: 10/17/2018 21:17             Medical Problem List and Plan: 1.  Decreased functional ability with gait disturbance secondary to left corona radiata infarction.  Lacunar secondary to small vessel disease             -admit to inpatient rehab 2.  DVT Prophylaxis/Anticoagulation: Subcutaneous heparin.  No bleeding episodes 3. Pain Management: Tylenol as needed 4. Mood: Provide emotional support 5. Neuropsych: This patient is capable of making decisions on his own behalf. 6. Skin/Wound Care: Routine skin checks 7. Fluids/Electrolytes/Nutrition: Routine in and outs with follow-up chemistries 8.  Hypertension.  Permissive hypertension.  Patient on Tenormin 25 mg daily prior to admission.  Resume as needed 9.  Diabetes mellitus with peripheral neuropathy.  Hemoglobin A1c 6.5.  Glucotrol 5 mg daily.  Check blood sugars before meals and at bedtime 10.  CAD with CABG 1997.  No chest pain or shortness of breath.  Continue aspirin 11.  History of non-Hodgkin's lymphoma.  Follow-up as outpatient 12.  History of left foot drop.  Patient wears an AFO brace, Fairly worn. May need some upkeep.  13.  Hyperlipidemia.  Crestor/Lovaza   Post Admission Physician Evaluation: 1. Functional deficits secondary  to left corona radiata infarct. 2. Patient is admitted to receive collaborative, interdisciplinary care between the physiatrist, rehab nursing staff, and therapy team. 3. Patient's level of medical complexity and substantial therapy needs in context of that medical necessity cannot be provided at a lesser intensity of care such as a SNF. 4. Patient has experienced substantial functional loss from his/her baseline which was documented above under the  "Functional History" and "Functional Status" headings.  Judging by the patient's diagnosis, physical exam, and functional history, the patient has potential for functional progress which will result in measurable gains while on inpatient rehab.  These gains will be of substantial  and practical use upon discharge  in facilitating mobility and self-care at the household level. 5. Physiatrist will provide 24 hour management of medical needs as well as oversight of the therapy plan/treatment and provide guidance as appropriate regarding the interaction of the two. 6. The Preadmission Screening has been reviewed and patient status is unchanged unless otherwise stated above. 7. 24 hour rehab nursing will assist with bladder management, bowel management, safety, skin/wound care, disease management, medication administration, pain management and patient education  and help integrate therapy concepts, techniques,education, etc. 8. PT will assess and treat for/with: Lower extremity strength, range of motion, stamina, balance, functional mobility, safety, adaptive techniques and equipment, NMR, family education.   Goals are: mod I to supervision. 9. OT will assess and treat for/with: ADL's, functional mobility, safety, upper extremity strength, adaptive techniques and equipment, NMR, family ed.   Goals are: mod I to supervision. Therapy may proceed with showering this patient. 10. SLP will assess and treat for/with: n/a.  Goals are: n/a. 11. Case Management and Social Worker will assess and treat for psychological issues and discharge planning. 12. Team conference will be held weekly to assess progress toward goals and to determine barriers to discharge. 13. Patient will receive at least 3 hours of therapy per day at least 5 days per week. 14. ELOS: 8-13 days       15. Prognosis:  excellent     I have personally performed a face to face diagnostic evaluation of this patient and formulated the key components of  the plan.  Additionally, I have personally reviewed laboratory data, imaging studies, as well as relevant notes and concur with the physician assistant's documentation above.   Meredith Staggers, MD, Mellody Drown     Lavon Paganini Kalama, PA-C 10/19/2018  The patient's status has not changed. The original post admission physician evaluation remains appropriate, and any changes from the pre-admission screening or documentation from the acute chart are noted above.  Meredith Staggers, MD 10/19/2018

## 2018-10-19 NOTE — Progress Notes (Signed)
SLP Cancellation Note  Patient Details Name: Zachary Spence MRN: [id number] DOB: 07-Feb-1923   Cancelled treatment:       Reason Eval/Treat Not Completed: SLP screened, no needs identified, will sign off   Shahrukh Pasch, Katherene Ponto 10/19/2018, 10:04 AM

## 2018-10-19 NOTE — Progress Notes (Signed)
PROGRESS NOTE    MARLO GOODRICH  GGY:[id number] DOB: 08-23-1923 DOA: 10/17/2018 PCP: Tracie Harrier, MD   Brief Narrative:  84 year old with a history of coronary artery disease status post bypass in 1997, essential hypertension, non-Hodgkin's lymphoma in remission, diabetes mellitus type 2 came to the hospital with complains of sudden onset of dizziness and weakness.  Patient had a CT of the head in the ER which was negative for acute finding but MRI showed small acute ischemic stroke and posterior left corona radiata.  Neurology was consulted.  Patient was admitted for routine work-up.  Patient's Zocor was changed to Crestor 5 mg.  CTA of head and neck showed right anterior parotid node increased in size systems 2016 which can be followed up outpatient.  Plan is to continue aspirin 81 mg and Plavix 75 mg daily for 3 weeks followed by Plavix alone.  Physical therapy recommended CIR.   Assessment & Plan:   Principal Problem:   CVA (cerebral vascular accident) ([place]) Active Problems:   Carpal tunnel syndrome   Diabetes mellitus (HCC)   BP (high blood pressure)   HLD (hyperlipidemia)   H/O coronary artery bypass surgery   Benign essential HTN   Stage 2 chronic kidney disease   Acute CVA and left posterior coronary radiata Dizziness -Stroke protocol -Allow for permissive hypertension for the first 24-48h - only treat PRN if SBP >220 mmHg. Blood pressures can be gradually normalized to SBP<140 upon discharge. -MRI brain without contrast -left posterior coronary radiata CVA.  CT of the head and neck showed right anterior parotid node increased in size which can be followed up outpatient. -Continue aspirin 81 mg and Plavix 75 mg daily for 3 weeks followed by Plavix alone -Echocardiogram- EF 55 to 60%, mild LVH -Lipid Panel-LDL 132, and A1C-6.5 -Frequent neuro checks -Zocor changed to Crestor per neurology -Risk factor modification -Consult Neurology -PT/OT eval- CIR -Normal saline  100 cc/h  Coronary artery disease status post CABG -Currently patient is chest pain-free, continue home aspirin and statin.  Blood pressure medications on hold due to permissive hypertension.  Diabetes mellitus type 2 -Continue glipizide, insulin sliding scale and Accu-Chek  Hyperlipidemia -LDL elevated 132, Zocor changed to Crestor  Essential hypertension -We will discontinue home blood pressure medicines at this time.  DVT prophylaxis: Heparin Code Status: Full code Family Communication: None at bedside Disposition Plan: Patient will eventually be transitioned to CIR once cleared by neurology.  Consultants:   Neurology  Procedures:   None  Antimicrobials:   None   Subjective: Still reports some mild dizziness but no other new complaints.  Review of Systems Otherwise negative except as per HPI, including: General = no fevers, chills,  malaise, fatigue HEENT/EYES = negative for pain, redness, loss of vision, double vision, blurred vision, loss of hearing, sore throat, hoarseness, dysphagia Cardiovascular= negative for chest pain, palpitation, murmurs, lower extremity swelling Respiratory/lungs= negative for shortness of breath, cough, hemoptysis, wheezing, mucus production Gastrointestinal= negative for nausea, vomiting,, abdominal pain, melena, hematemesis Genitourinary= negative for Dysuria, Hematuria, Change in Urinary Frequency MSK = Negative for arthralgia, myalgias, Back Pain, Joint swelling  Neurology= Negative for headache, seizures, numbness, tingling  Psychiatry= Negative for anxiety, depression, suicidal and homocidal ideation Allergy/Immunology= Medication/Food allergy as listed  Skin= Negative for Rash, lesions, ulcers, itching   Objective: Vitals:   10/18/18 2021 10/19/18 0000 10/19/18 0634 10/19/18 0744  BP: 122/72 (!) 101/57 129/69 117/71  Pulse: 67 74 69 71  Resp: 17 18 18 20   Temp: 98.3  F (36.8 C) 98.4 F (36.9 C) 97.7 F (36.5 C) 97.8 F  (36.6 C)  TempSrc: Oral Oral Oral Oral  SpO2: 94% 94% 91% 95%  Weight:      Height:       No intake or output data in the 24 hours ending 10/19/18 1234 Filed Weights   10/17/18 1759  Weight: 84.4 kg    Examination:  Constitutional: NAD, calm, comfortable Eyes: PERRL, lids and conjunctivae normal ENMT: Mucous membranes are moist. Posterior pharynx clear of any exudate or lesions.Normal dentition.  Neck: normal, supple, no masses, no thyromegaly Respiratory: clear to auscultation bilaterally, no wheezing, no crackles. Normal respiratory effort. No accessory muscle use.  Cardiovascular: Regular rate and rhythm, no murmurs / rubs / gallops. No extremity edema. 2+ pedal pulses. No carotid bruits.  Abdomen: no tenderness, no masses palpated. No hepatosplenomegaly. Bowel sounds positive.  Musculoskeletal: no clubbing / cyanosis. No joint deformity upper and lower extremities. Good ROM, no contractures. Normal muscle tone.  Skin: no rashes, lesions, ulcers. No induration Neurologic: CN 2-12 grossly intact. Sensation intact, DTR normal. Strength 4/5 in all 4.  Psychiatric: Normal judgment and insight. Alert and oriented x 3. Normal mood.   Data Reviewed:   CBC: Recent Labs  Lab 10/17/18 1813 10/18/18 0016 10/18/18 0313  WBC 7.5 8.1 8.1  NEUTROABS 3.6  --   --   HGB 14.5 14.4 13.8  HCT 47.2 44.7 43.4  MCV 97.3 95.1 96.7  PLT 312 283 631   Basic Metabolic Panel: Recent Labs  Lab 10/17/18 1813 10/18/18 0016 10/18/18 0313  NA 140  --  141  K 4.3  --  3.8  CL 109  --  109  CO2 23  --  22  GLUCOSE 101*  --  94  BUN 24*  --  21  CREATININE 1.13 1.12 1.06  CALCIUM 9.4  --  8.9   GFR: Estimated Creatinine Clearance: 47.8 mL/min (by C-G formula based on SCr of 1.06 mg/dL). Liver Function Tests: Recent Labs  Lab 10/17/18 1813 10/18/18 0313  AST 25 22  ALT 21 18  ALKPHOS 53 48  BILITOT 0.6 0.9  PROT 7.1 6.3*  ALBUMIN 4.0 3.7   No results for input(s): LIPASE, AMYLASE  in the last 168 hours. No results for input(s): AMMONIA in the last 168 hours. Coagulation Profile: Recent Labs  Lab 10/17/18 1813  INR 1.00   Cardiac Enzymes: No results for input(s): CKTOTAL, CKMB, CKMBINDEX, TROPONINI in the last 168 hours. BNP (last 3 results) No results for input(s): PROBNP in the last 8760 hours. HbA1C: Recent Labs    10/18/18 0313  HGBA1C 6.5*   CBG: Recent Labs  Lab 10/18/18 1146 10/18/18 1807 10/18/18 2114 10/19/18 0632 10/19/18 1118  GLUCAP 109* 80 120* 109* 103*   Lipid Profile: Recent Labs    10/18/18 0313  CHOL 193  HDL 39*  LDLCALC 132*  TRIG 112  CHOLHDL 4.9   Thyroid Function Tests: No results for input(s): TSH, T4TOTAL, FREET4, T3FREE, THYROIDAB in the last 72 hours. Anemia Panel: No results for input(s): VITAMINB12, FOLATE, FERRITIN, TIBC, IRON, RETICCTPCT in the last 72 hours. Sepsis Labs: No results for input(s): PROCALCITON, LATICACIDVEN in the last 168 hours.  No results found for this or any previous visit (from the past 240 hour(s)).       Radiology Studies: Ct Angio Head W Or Wo Contrast  Result Date: 10/18/2018 CLINICAL DATA:  84 y/o  M; stroke for follow-up. EXAM: CT ANGIOGRAPHY  HEAD AND NECK TECHNIQUE: Multidetector CT imaging of the head and neck was performed using the standard protocol during bolus administration of intravenous contrast. Multiplanar CT image reconstructions and MIPs were obtained to evaluate the vascular anatomy. Carotid stenosis measurements (when applicable) are obtained utilizing NASCET criteria, using the distal internal carotid diameter as the denominator. CONTRAST:  50 cc Isovue 370 COMPARISON:  10/17/2018 MRI and CT of the head. 11/18/2015 CT head. FINDINGS: CTA NECK FINDINGS Aortic arch: Bovine variant branching. Imaged portion shows no evidence of aneurysm or dissection. No significant stenosis of the major arch vessel origins. Atherosclerosis with mixed plaque. Right carotid system: No  evidence of dissection, stenosis (50% or greater) or occlusion. Mild non stenotic calcific atherosclerosis of carotid bifurcation. Left carotid system: No evidence of dissection, stenosis (50% or greater) or occlusion. Vertebral arteries: Left dominant. No evidence of dissection, stenosis (50% or greater) or occlusion. Skeleton: Mild-to-moderate cervical spondylosis with multilevel disc and facet degenerative changes. No high-grade bony canal stenosis. C3-4 grade 1 anterolisthesis due to facet arthropathy. Other neck: Right anterior parotid nodule is increased in size from 2016 measuring 18 x 14 mm (AP by ML series 6, image 84). Upper chest: Status post CABG with LIMA and saphenous grafts, incompletely visualized. Healed upper median sternotomy. Review of the MIP images confirms the above findings CTA HEAD FINDINGS Anterior circulation: No significant stenosis, proximal occlusion, aneurysm, or vascular malformation. Mild non stenotic calcific atherosclerosis of carotid siphons. Posterior circulation: No significant stenosis, proximal occlusion, aneurysm, or vascular malformation. Mild right V4 stenosis. Venous sinuses: As permitted by contrast timing, patent. Anatomic variants: Complete circle-of-Willis. Fetal right PCA. Delayed phase: No abnormal intracranial enhancement. Review of the MIP images confirms the above findings IMPRESSION: 1. Patent carotid and vertebral arteries. No dissection, aneurysm, or hemodynamically significant stenosis utilizing NASCET criteria. 2. Patent anterior and posterior intracranial circulation. No large vessel occlusion, aneurysm, or significant stenosis. 3. Right anterior parotid nodule is increased in size from 2016. 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 If clinically indicated, consider MRI of the face with and without contrast for further evaluation and possible surgical  consultation on a nonemergent basis. Electronically Signed   By: Kristine Garbe M.D.   On: 10/18/2018 01:31   Ct Head Wo Contrast  Result Date: 10/17/2018 CLINICAL DATA:  Unsteady gait. Leg weakness. Symptoms since this morning. EXAM: CT HEAD WITHOUT CONTRAST TECHNIQUE: Contiguous axial images were obtained from the base of the skull through the vertex without intravenous contrast. COMPARISON:  11/18/2015 FINDINGS: Brain: No evidence of acute infarction, hemorrhage, hydrocephalus, extra-axial collection or mass lesion/mass effect. There is mild ventricular and sulcal enlargement reflecting age-appropriate volume loss. Small deep white matter lacunar infarct in the left parietal lobe. Small old lacunar infarct in the left caudate nucleus head. Small old lacunar infarct, right cerebellum. Vascular: No hyperdense vessel or unexpected calcification. Skull: Normal. Negative for fracture or focal lesion. Sinuses/Orbits: Globes and orbits are unremarkable. The visualized sinuses and mastoid air cells are clear. Other: None. IMPRESSION: 1. No acute intracranial abnormalities. 2. Age-appropriate volume loss.  Three small old lacunar infarcts. Electronically Signed   By: Lajean Manes M.D.   On: 10/17/2018 18:39   Ct Angio Neck W Or Wo Contrast  Result Date: 10/18/2018 CLINICAL DATA:  84 y/o  M; stroke for follow-up. EXAM: CT ANGIOGRAPHY HEAD AND NECK TECHNIQUE: Multidetector CT imaging of the head and neck was performed using the  standard protocol during bolus administration of intravenous contrast. Multiplanar CT image reconstructions and MIPs were obtained to evaluate the vascular anatomy. Carotid stenosis measurements (when applicable) are obtained utilizing NASCET criteria, using the distal internal carotid diameter as the denominator. CONTRAST:  50 cc Isovue 370 COMPARISON:  10/17/2018 MRI and CT of the head. 11/18/2015 CT head. FINDINGS: CTA NECK FINDINGS Aortic arch: Bovine variant branching.  Imaged portion shows no evidence of aneurysm or dissection. No significant stenosis of the major arch vessel origins. Atherosclerosis with mixed plaque. Right carotid system: No evidence of dissection, stenosis (50% or greater) or occlusion. Mild non stenotic calcific atherosclerosis of carotid bifurcation. Left carotid system: No evidence of dissection, stenosis (50% or greater) or occlusion. Vertebral arteries: Left dominant. No evidence of dissection, stenosis (50% or greater) or occlusion. Skeleton: Mild-to-moderate cervical spondylosis with multilevel disc and facet degenerative changes. No high-grade bony canal stenosis. C3-4 grade 1 anterolisthesis due to facet arthropathy. Other neck: Right anterior parotid nodule is increased in size from 2016 measuring 18 x 14 mm (AP by ML series 6, image 84). Upper chest: Status post CABG with LIMA and saphenous grafts, incompletely visualized. Healed upper median sternotomy. Review of the MIP images confirms the above findings CTA HEAD FINDINGS Anterior circulation: No significant stenosis, proximal occlusion, aneurysm, or vascular malformation. Mild non stenotic calcific atherosclerosis of carotid siphons. Posterior circulation: No significant stenosis, proximal occlusion, aneurysm, or vascular malformation. Mild right V4 stenosis. Venous sinuses: As permitted by contrast timing, patent. Anatomic variants: Complete circle-of-Willis. Fetal right PCA. Delayed phase: No abnormal intracranial enhancement. Review of the MIP images confirms the above findings IMPRESSION: 1. Patent carotid and vertebral arteries. No dissection, aneurysm, or hemodynamically significant stenosis utilizing NASCET criteria. 2. Patent anterior and posterior intracranial circulation. No large vessel occlusion, aneurysm, or significant stenosis. 3. Right anterior parotid nodule is increased in size from 2016. 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 If clinically indicated, consider MRI of the face with and without contrast for further evaluation and possible surgical consultation on a nonemergent basis. Electronically Signed   By: Kristine Garbe M.D.   On: 10/18/2018 01:31   Mr Brain Wo Contrast  Result Date: 10/17/2018 CLINICAL DATA:  Unsteady gait and lower extremity weakness. EXAM: MRI HEAD WITHOUT CONTRAST TECHNIQUE: Multiplanar, multiecho pulse sequences of the brain and surrounding structures were obtained without intravenous contrast. COMPARISON:  Head CT 10/17/2018 FINDINGS: BRAIN: Focus of abnormal diffusion restriction within the posterior left corona radiata. No other diffusion abnormality. The midline structures are normal. No midline shift or other mass effect. Old right cerebellar infarct. Old left caudate and left corona radiata infarcts. Mild periventricular white matter hyperintensity. Generalized atrophy without lobar predilection. Susceptibility-sensitive sequences show no chronic microhemorrhage or superficial siderosis. VASCULAR: Major intracranial arterial and venous sinus flow voids are normal. SKULL AND UPPER CERVICAL SPINE: Calvarial bone marrow signal is normal. There is no skull base mass. Visualized upper cervical spine and soft tissues are normal. SINUSES/ORBITS: No fluid levels or advanced mucosal thickening. No mastoid or middle ear effusion. The orbits are normal. IMPRESSION: 1. Small focus of acute ischemia within the posterior left corona radiata. No hemorrhage or mass effect. 2. Findings of chronic small vessel ischemia, including multiple old lacunar infarcts. Electronically Signed   By: Ulyses Jarred M.D.   On: 10/17/2018 21:17        Scheduled Meds: .  stroke: mapping our early stages  of recovery book   Does not apply Once  . aspirin EC  81 mg Oral Daily  . calcium carbonate  1,250 mg Oral Q breakfast  . clopidogrel  75 mg Oral Daily  . folic acid  1  mg Oral Daily  . glipiZIDE  5 mg Oral Q breakfast  . heparin  5,000 Units Subcutaneous Q8H  . insulin aspart  0-5 Units Subcutaneous QHS  . insulin aspart  0-9 Units Subcutaneous TID WC  . multivitamin  1 tablet Oral Daily  . multivitamin with minerals  1 tablet Oral Daily  . omega-3 acid ethyl esters  1 g Oral Daily  . rosuvastatin  5 mg Oral q1800   Continuous Infusions: . sodium chloride 100 mL/hr at 10/18/18 0121     LOS: 2 days   Time spent= 25 mins    Conchita Truxillo Arsenio Loader, MD [facility] Pager [phone]   If 7PM-7AM, please contact night-coverage [email or web address] Password [facility] 10/19/2018, 12:34 PM

## 2018-10-19 NOTE — PMR Pre-admission (Signed)
PMR Admission Coordinator Pre-Admission Assessment  Patient: Zachary Spence is an 84 y.o., male MRN: [id number] DOB: 1923/04/02 Height: 6' (182.9 cm) Weight: 84.4 kg              Insurance Information HMO:    PPO:      PCP:      IPA:      80/20:      OTHER:  PRIMARY: Medicare a and b      Policy#: [id number] a/pt to provide updated number      Subscriber: pt Benefits:  Phone #: online     Name: 10/19/2018 Eff. Date: 04/21/1990     Deduct: $1364      Out of Pocket Max: none      Life Max: none CIR: 100%      SNF: 20 full days Outpatient: 80%     Co-Pay: 20% Home Health: 100%      Co-Pay: none DME: 80%     Co-Pay: 20% Providers: pt choice  SECONDARY: aarp supplement      Policy#: [id number]      Subscriber: pt  Medicaid Application Date:       Case Manager:  Disability Application Date:       Case Worker:   Emergency [place]    Name Relation Home Work Mobile   [place] Son [phone]  [phone]     Current Medical History  Patient Admitting Diagnosis: acute ischemia within the posterior left corona radiata  History of Present Illness:Pruiett is a 84 year old right-handed male history of CAD with CABG in 1997, hypertension, diabetes mellitus, non-Hodgkin's lymphoma and left foot drop.  Presented 10/17/2018 with generalized weakness and gait instability. Cranial CT scan reviewed, unremarkable for acute intracranial process.  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  Patient did not receive TPA.  MRI of the brain showed small focus of acute ischemia within the posterior left corona radiata.  No hemorrhage or mass-effect.  CT angiogram of head and neck with no dissection or aneurysm.  Echocardiogram with ejection fraction of 60%.  Systolic function was normal.  Presently on aspirin and Plavix for CVA prophylaxis.  Subcutaneous heparin for DVT prophylaxis.  Tolerating a regular diet.    Complete NIHSS TOTAL: 0    Past Medical History  Past  Medical History:  Diagnosis Date  . Bowel obstruction ([place])   . Diabetes mellitus without complication ([place])   . Hypertension   . Non Hodgkin's lymphoma ([place])     Family History  family history is not on file.  Prior Rehab/Hospitalizations:  Has the patient had major surgery during 100 days prior to admission? No  Current Medications   Current Facility-Administered Medications:  .   stroke: mapping our early stages of recovery book, , Does not apply, Once, Elwyn Reach, MD, Stopped at 10/18/18 [phone] .  0.9 %  sodium chloride infusion, , Intravenous, Continuous, Garba, Mohammad L, MD, Last Rate: 100 mL/hr at 10/18/18 0121 .  acetaminophen (TYLENOL) tablet 650 mg, 650 mg, Oral, Q4H PRN **OR** acetaminophen (TYLENOL) solution 650 mg, 650 mg, Per Tube, Q4H PRN **OR** acetaminophen (TYLENOL) suppository 650 mg, 650 mg, Rectal, Q4H PRN, Jonelle Sidle, Mohammad L, MD .  aspirin EC tablet 81 mg, 81 mg, Oral, Daily, Biby, Sharon L, NP, 81 mg at 10/19/18 1024 .  calcium carbonate (OS-CAL - dosed in mg of elemental calcium) tablet 1,250 mg, 1,250 mg, Oral, Q breakfast, Jonelle Sidle, Mohammad L, MD, 1,250 mg at 10/19/18 0825 .  clopidogrel (  PLAVIX) tablet 75 mg, 75 mg, Oral, Daily, Biby, Sharon L, NP, 75 mg at 10/19/18 1024 .  folic acid (FOLVITE) tablet 1 mg, 1 mg, Oral, Daily, Jonelle Sidle, Mohammad L, MD, 1 mg at 10/19/18 1024 .  glipiZIDE (GLUCOTROL) tablet 5 mg, 5 mg, Oral, Q breakfast, Garba, Mohammad L, MD, 5 mg at 10/19/18 0800 .  heparin injection 5,000 Units, 5,000 Units, Subcutaneous, Q8H, Elwyn Reach, MD, 5,000 Units at 10/19/18 [phone] .  ibuprofen (ADVIL,MOTRIN) tablet 200 mg, 200 mg, Oral, Daily PRN, Garba, Mohammad L, MD .  insulin aspart (novoLOG) injection 0-5 Units, 0-5 Units, Subcutaneous, QHS, Garba, Mohammad L, MD .  insulin aspart (novoLOG) injection 0-9 Units, 0-9 Units, Subcutaneous, TID WC, Garba, Mohammad L, MD .  multivitamin (PROSIGHT) tablet 1 tablet, 1 tablet, Oral, Daily, Gala Romney L, MD, 1 tablet at 10/19/18 1026 .  multivitamin with minerals tablet 1 tablet, 1 tablet, Oral, Daily, Elwyn Reach, MD, 1 tablet at 10/19/18 1025 .  omega-3 acid ethyl esters (LOVAZA) capsule 1 g, 1 g, Oral, Daily, Jonelle Sidle, Mohammad L, MD, 1 g at 10/19/18 1024 .  rosuvastatin (CRESTOR) tablet 5 mg, 5 mg, Oral, q1800, Biby, Sharon L, NP, 5 mg at 10/18/18 1901 .  senna-docusate (Senokot-S) tablet 1 tablet, 1 tablet, Oral, QHS PRN, Elwyn Reach, MD  Patients Current Diet:  Diet Order            Diet Carb Modified Fluid consistency: Thin; Room service appropriate? Yes  Diet effective now              Precautions / Restrictions Precautions Precautions: Fall Other Brace/Splint: Pt wears L AFO at baseline.  Restrictions Weight Bearing Restrictions: No   Has the patient had 2 or more falls or a fall with injury in the past year?No  Prior Activity Level Community (5-7x/wk): Mod I ; very active; gym 5 days per week; drives  Home Assistive Devices / Equipment Home Equipment: Kasandra Knudsen - single point, Shower seat - built in, [employer] - tub/shower  Prior Device Use: Indicate devices/aids used by the patient prior to current illness, exacerbation or injury? cane and AFO  Prior Functional Level Prior Function Level of Independence: Independent with assistive device(s) Comments: Pt uses cane and L AFO for ambulation. Reports he was still driving as well. Working out at gym several days a week  Self Care: Did the patient need help bathing, dressing, using the toilet or eating?  Independent  Indoor Mobility: Did the patient need assistance with walking from room to room (with or without device)? Independent  Stairs: Did the patient need assistance with internal or external stairs (with or without device)? Independent  Functional Cognition: Did the patient need help planning regular tasks such as shopping or remembering to take medications? Independent  Current Functional  Level Cognition  Overall Cognitive Status: Within Functional Limits for tasks assessed Orientation Level: Oriented X4    Extremity Assessment (includes Sensation/Coordination)  Upper Extremity Assessment: Generalized weakness  Lower Extremity Assessment: RLE deficits/detail, LLE deficits/detail RLE Deficits / Details: Decreased coordination noted with heel to shin test. Functional weakness noted during sidestepping at EOB.  LLE Deficits / Details: L foot drop at baseline. WEakness in LLE at baseline.     ADLs  Overall ADL's : Needs assistance/impaired Grooming: Standing, Min guard Upper Body Bathing: Set up, Sitting Lower Body Bathing: Minimal assistance, Sit to/from stand Upper Body Dressing : Set up, Sitting Lower Body Dressing: Minimal assistance, Sit to/from stand Toilet  Transfer: RW, Minimal assistance, Grab bars Toileting- Clothing Manipulation and Hygiene: Minimal assistance, Sit to/from stand General ADL Comments: min A overall with use of RW     Mobility  Overal bed mobility: Needs Assistance Bed Mobility: Supine to Sit, Sit to Supine Supine to sit: Supervision Sit to supine: Supervision General bed mobility comments: Supervision and increased time to perform bed mobility tasks.     Transfers  Overall transfer level: Needs assistance Equipment used: Rolling walker (2 wheeled), Straight cane Transfers: Sit to/from Stand Sit to Stand: Min assist General transfer comment: Mod A for lift assist and steadying to come to standing.     Ambulation / Gait / Stairs / Wheelchair Mobility  Ambulation/Gait Ambulation/Gait assistance: Mod assist Assistive device: Straight cane General Gait Details: Unsteady when performing side steps at EOB. Noted decreased coordination and required mod A for steadying assist.  Gait velocity: Decreased     Posture / Balance Balance Overall balance assessment: Needs assistance Sitting-balance support: No upper extremity supported, Feet  supported Sitting balance-Leahy Scale: Good Standing balance support: Single extremity supported, During functional activity Standing balance-Leahy Scale: Fair Standing balance comment: Reliant on RUE and external support.     Special needs/care consideration BiPAP/CPAP n/a CPM n/a Continuous Drip IV n/a Dialysis n/a Life Vest n/a Oxygen n/a Special Bed n/a Trach Size n/a Wound Vac n/a Skin tear right leg Bowel mgmt: no LBM documented Bladder mgmt: continent Diabetic mgmt yes   Previous Home Environment Living Arrangements: (61 year old son, [name])  Lives With: Son Available Help at Discharge: Family, Available PRN/intermittently Type of Home: House Home Layout: One level Home Access: Stairs to enter Entrance Stairs-Rails: None Technical brewer of Steps: 1 Bathroom Shower/Tub: Multimedia programmer: Handicapped height Bathroom Accessibility: Yes How Accessible: Accessible via walker [place]: No  Discharge Living Setting Plans for Discharge Living Setting: Patient's home, Lives with (comment)(son, David) Type of Home at Discharge: House Discharge Home Layout: One level Discharge Home Access: Stairs to enter Entrance Stairs-Rails: None Entrance Stairs-Number of Steps: 1 Discharge Bathroom Shower/Tub: Walk-in shower Discharge Bathroom Toilet: Handicapped height Discharge Bathroom Accessibility: Yes How Accessible: Accessible via walker Does the patient have any problems obtaining your medications?: No  Social/Family/Support Systems Patient Roles: Parent Contact Information: [name], son Anticipated Caregiver: son Anticipated Ambulance person Information: see above Ability/Limitations of Caregiver: son works Careers adviser: Intermittent Discharge Plan Discussed with Primary Caregiver: Yes Is Caregiver In Agreement with Plan?: Yes Does Caregiver/Family have Issues with Lodging/Transportation while Pt is in Rehab?:  No   Goals/Additional Needs Patient/Family Goal for Rehab: Mod I to supervision with PT and OT Expected length of stay: ELOS 8 to 13 days Pt/Family Agrees to Admission and willing to participate: Yes Program Orientation Provided & Reviewed with Pt/Caregiver Including Roles  & Responsibilities: Yes   Decrease burden of Care through IP rehab admission: n/a  Possible need for SNF placement upon discharge: not anticipated  Patient Condition: This patient's condition remains as documented in the consult dated 09/19/2018, in which the Rehabilitation Physician determined and documented that the patient's condition is appropriate for intensive rehabilitative care in an inpatient rehabilitation facility. Will admit to inpatient rehab today.  Preadmission Screen Completed By:  Cleatrice Burke, 10/19/2018 3:02 PM ______________________________________________________________________   Discussed status with Dr. Naaman Plummer on 10/19/2018 at  1507 and received telephone approval for admission today.  Admission Coordinator:  Cleatrice Burke, time 5035 Date 10/19/2018

## 2018-10-19 NOTE — H&P (Signed)
Physical Medicine and Rehabilitation Admission H&P    Chief Complaint  Patient presents with  . Weakness  : HPI: DEAUNDRE Zachary Spence is a 84 year old right-handed male history of CAD with CABG in 1997, hypertension, diabetes mellitus, non-Hodgkin's lymphoma and left foot drop.  Presented 10/17/2018 with generalized weakness and gait instability read per chart review, patient, and son, patient lives with son.  Independent with a cane and left AFO brace.  He still drives.  Son works during the day.  One level home with one-step to entry.  Cranial CT scan reviewed, unremarkable for acute intracranial process.  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  Patient did not receive TPA.  MRI of the brain showed small focus of acute ischemia within the posterior left corona radiata.  No hemorrhage or mass-effect.  CT angiogram of head and neck with no dissection or aneurysm.  Echocardiogram with ejection fraction of 60%.  Systolic function was normal.  Presently on aspirin and Plavix for CVA prophylaxis.  Subcutaneous heparin for DVT prophylaxis.  Tolerating a regular diet.  Therapy evaluations completed with recommendations of physical medicine rehab consult.  Patient was admitted for a comprehensive rehab program.  Review of Systems  Constitutional: Negative for chills and fever.  HENT: Negative for hearing loss.   Eyes: Negative for blurred vision.  Respiratory: Negative for shortness of breath.   Cardiovascular: Negative for chest pain, palpitations and leg swelling.  Gastrointestinal: Positive for constipation. Negative for nausea and vomiting.  Genitourinary: Positive for urgency. Negative for flank pain and hematuria.  Musculoskeletal: Positive for joint pain and myalgias.  Skin: Negative for rash.  Neurological: Positive for dizziness and weakness.  All other systems reviewed and are negative.  Past Medical History:  Diagnosis Date  . Bowel obstruction ([place])   . Diabetes mellitus without  complication ([place])   . Hypertension   . Non Hodgkin's lymphoma [facility])    Past Surgical History:  Procedure Laterality Date  . APPENDECTOMY    . CORONARY ARTERY BYPASS GRAFT    . SPLENECTOMY     No family history on file. Social History:  reports that he has quit smoking. He has never used smokeless tobacco. He reports that he does not drink alcohol or use drugs. Allergies:  Allergies  Allergen Reactions  . Erythromycin Swelling    Swelling of hands   Medications Prior to Admission  Medication Sig Dispense Refill  . atenolol (TENORMIN) 25 MG tablet Take 25 mg by mouth daily.     . calcium carbonate (CALCIUM 600) 600 MG TABS tablet Take 600 mg by mouth daily.    . folic acid (FOLVITE) 1 MG tablet Take 1 mg by mouth daily.    Marland Kitchen glipiZIDE (GLUCOTROL) 5 MG tablet Take 5 mg by mouth daily.     Marland Kitchen ibuprofen (ADVIL,MOTRIN) 200 MG tablet Take 200 mg by mouth daily as needed for headache (pain).    . Multiple Vitamin (MULTIVITAMIN WITH MINERALS) TABS tablet Take 1 tablet by mouth daily.    . Multiple Vitamins-Minerals (PRESERVISION AREDS 2 PO) Take 1 capsule by mouth daily.     . Omega-3 Fatty Acids (FISH OIL) 1000 MG CAPS Take 1,000 mg by mouth daily.     . simvastatin (ZOCOR) 40 MG tablet Take 20 mg by mouth See admin instructions. Take 1/2 tablet (20 mg) by mouth every Tuesday, Thursday, Saturday      Drug Regimen Review Drug regimen was reviewed and remains appropriate with no significant issues identified  Home: Home Living Family/patient expects to be discharged to:: Private residence Living Arrangements: Children Available Help at Discharge: Family, Available PRN/intermittently Type of Home: House Home Access: Stairs to enter Technical brewer of Steps: 1 Entrance Stairs-Rails: None Home Layout: One level Bathroom Shower/Tub: Multimedia programmer: Handicapped height Home Equipment: Radio producer - single point, Civil engineer, contracting - built in, [employer] - tub/shower     Functional History: Prior Function Level of Independence: Independent with assistive device(s) Comments: Pt uses cane and L AFO for ambulation. Reports he was still driving as well. Working out at gym several days a week  Functional Status:  Mobility: Bed Mobility Overal bed mobility: Needs Assistance Bed Mobility: Supine to Sit, Sit to Supine Supine to sit: Supervision Sit to supine: Supervision General bed mobility comments: Supervision and increased time to perform bed mobility tasks.  Transfers Overall transfer level: Needs assistance Equipment used: Rolling walker (2 wheeled), Straight cane Transfers: Sit to/from Stand Sit to Stand: Min assist General transfer comment: Mod A for lift assist and steadying to come to standing.  Ambulation/Gait Ambulation/Gait assistance: Mod assist Assistive device: Straight cane General Gait Details: Unsteady when performing side steps at EOB. Noted decreased coordination and required mod A for steadying assist.  Gait velocity: Decreased     ADL: ADL Overall ADL's : Needs assistance/impaired Grooming: Standing, Min guard Upper Body Bathing: Set up, Sitting Lower Body Bathing: Minimal assistance, Sit to/from stand Upper Body Dressing : Set up, Sitting Lower Body Dressing: Minimal assistance, Sit to/from stand Toilet Transfer: RW, Minimal assistance, Grab bars Toileting- Clothing Manipulation and Hygiene: Minimal assistance, Sit to/from stand General ADL Comments: min A overall with use of RW   Cognition: Cognition Overall Cognitive Status: Within Functional Limits for tasks assessed Orientation Level: Oriented X4 Cognition Arousal/Alertness: Awake/alert Behavior During Therapy: WFL for tasks assessed/performed Overall Cognitive Status: Within Functional Limits for tasks assessed  Physical Exam: Blood pressure 117/71, pulse 71, temperature 97.8 F (36.6 C), temperature source Oral, resp. rate 20, height 6' (1.829 m), weight 84.4  kg, SpO2 95 %. Physical Exam  Vitals reviewed. Constitutional: He is oriented to person, place, and time. He appears well-developed and well-nourished.  HENT:  Head: Normocephalic and atraumatic.  Eyes: Pupils are equal, round, and reactive to light. EOM are normal.  Neck: Normal range of motion.  Cardiovascular: Normal rate and regular rhythm. Exam reveals no friction rub.  Murmur heard. Respiratory: Effort normal and breath sounds normal. No respiratory distress. He has no wheezes.  GI: Soft. He exhibits no distension. There is no tenderness.  Musculoskeletal: Normal range of motion. He exhibits no edema or deformity.  Neurological: He is alert and oriented to person, place, and time.  Alert.  Sitting edge of bed.  Follows full commands.  Makes good eye contact with examiner.  Provides his name age and date of birth.  Fair awareness of deficits. UE strength 4-/5 RUE, 4/5 LUE. RLE 4-/5. LLE 4/5 except for ADF which is 3-/5. AFO in place  Skin:  A few scattered abrasions on legs  Psychiatric: He has a normal mood and affect. His behavior is normal.    Results for orders placed or performed during the hospital encounter of 10/17/18 (from the past 48 hour(s))  Protime-INR     Status: None   Collection Time: 10/17/18  6:13 PM  Result Value Ref Range   Prothrombin Time 13.1 11.4 - 15.2 seconds   INR 1.00     Comment: Performed at [facility]  Lab, [address], [place], [place] [address]  APTT     Status: None   Collection Time: 10/17/18  6:13 PM  Result Value Ref Range   aPTT 29 24 - 36 seconds    Comment: Performed at [facility] [address], [place] [address]  CBC     Status: None   Collection Time: 10/17/18  6:13 PM  Result Value Ref Range   WBC 7.5 4.0 - 10.5 K/uL   RBC 4.85 4.22 - 5.81 MIL/uL   Hemoglobin 14.5 13.0 - 17.0 g/dL   HCT 47.2 39.0 - 52.0 %   MCV 97.3 80.0 - 100.0 fL   MCH 29.9 26.0 - 34.0 pg   MCHC 30.7 30.0 - 36.0 g/dL   RDW 14.3 11.5 -  15.5 %   Platelets 312 150 - 400 K/uL   nRBC 0.0 0.0 - 0.2 %    Comment: Performed at [facility], [place] [address], [place], [place] [address]  Differential     Status: None   Collection Time: 10/17/18  6:13 PM  Result Value Ref Range   Neutrophils Relative % 47 %   Neutro Abs 3.6 1.7 - 7.7 K/uL   Lymphocytes Relative 39 %   Lymphs Abs 2.9 0.7 - 4.0 K/uL   Monocytes Relative 11 %   Monocytes Absolute 0.8 0.1 - 1.0 K/uL   Eosinophils Relative 2 %   Eosinophils Absolute 0.1 0.0 - 0.5 K/uL   Basophils Relative 1 %   Basophils Absolute 0.1 0.0 - 0.1 K/uL   Immature Granulocytes 0 %   Abs Immature Granulocytes 0.02 0.00 - 0.07 K/uL    Comment: Performed at [facility] [address] [place], [place] [address]  Comprehensive metabolic panel     Status: Abnormal   Collection Time: 10/17/18  6:13 PM  Result Value Ref Range   Sodium 140 135 - 145 mmol/L   Potassium 4.3 3.5 - 5.1 mmol/L   Chloride 109 98 - 111 mmol/L   CO2 23 22 - 32 mmol/L   Glucose, Bld 101 (H) 70 - 99 mg/dL   BUN 24 (H) 8 - 23 mg/dL   Creatinine, Ser 1.13 0.61 - 1.24 mg/dL   Calcium 9.4 8.9 - 10.3 mg/dL   Total Protein 7.1 6.5 - 8.1 g/dL   Albumin 4.0 3.5 - 5.0 g/dL   AST 25 15 - 41 U/L   ALT 21 0 - 44 U/L   Alkaline Phosphatase 53 38 - 126 U/L   Total Bilirubin 0.6 0.3 - 1.2 mg/dL   GFR calc non Af Amer 54 (L) >60 mL/min   GFR calc Af Amer >60 >60 mL/min    Comment: (NOTE) The eGFR has been calculated using the CKD EPI equation. This calculation has not been validated in all clinical situations. eGFR's persistently <60 mL/min signify possible Chronic Kidney Disease.    Anion gap 8 5 - 15    Comment: Performed at [facility] [address], [place], [place] [address]  I-stat troponin, ED     Status: None   Collection Time: 10/17/18  6:16 PM  Result Value Ref Range   Troponin i, poc 0.01 0.00 - 0.08 ng/mL   Comment 3            Comment: Due to the release kinetics of cTnI, a  negative result within the first hours of the onset of symptoms does not rule out myocardial infarction  with certainty. If myocardial infarction is still suspected, repeat the test at appropriate intervals.   CBG monitoring, ED     Status: Abnormal   Collection Time: 10/18/18 12:10 AM  Result Value Ref Range   Glucose-Capillary 110 (H) 70 - 99 mg/dL   Comment 1 Notify RN   CBC     Status: None   Collection Time: 10/18/18 12:16 AM  Result Value Ref Range   WBC 8.1 4.0 - 10.5 K/uL   RBC 4.70 4.22 - 5.81 MIL/uL   Hemoglobin 14.4 13.0 - 17.0 g/dL   HCT 44.7 39.0 - 52.0 %   MCV 95.1 80.0 - 100.0 fL   MCH 30.6 26.0 - 34.0 pg   MCHC 32.2 30.0 - 36.0 g/dL   RDW 14.2 11.5 - 15.5 %   Platelets 283 150 - 400 K/uL   nRBC 0.0 0.0 - 0.2 %    Comment: Performed at [facility], [place] [address] [place], [place] [address]  Creatinine, serum     Status: Abnormal   Collection Time: 10/18/18 12:16 AM  Result Value Ref Range   Creatinine, Ser 1.12 0.61 - 1.24 mg/dL   GFR calc non Af Amer 55 (L) >60 mL/min   GFR calc Af Amer >60 >60 mL/min    Comment: (NOTE) The eGFR has been calculated using the CKD EPI equation. This calculation has not been validated in all clinical situations. eGFR's persistently <60 mL/min signify possible Chronic Kidney Disease. Performed at [facility], [place] [address] [place], [place] [address]   Lipid panel     Status: Abnormal   Collection Time: 10/18/18  3:13 AM  Result Value Ref Range   Cholesterol 193 0 - 200 mg/dL   Triglycerides 112 <150 mg/dL   HDL 39 (L) >40 mg/dL   Total CHOL/HDL Ratio 4.9 RATIO   VLDL 22 0 - 40 mg/dL   LDL Cholesterol 132 (H) 0 - 99 mg/dL    Comment:        Total Cholesterol/HDL:CHD Risk Coronary Heart Disease Risk Table                     Men   Women  1/2 Average Risk   3.4   3.3  Average Risk       5.0   4.4  2 X Average Risk   9.6   7.1  3 X Average Risk  23.4   11.0        Use the calculated Patient Ratio above  and the CHD Risk Table to determine the patient's CHD Risk.        ATP III CLASSIFICATION (LDL):  <100     mg/dL   Optimal  100-129  mg/dL   Near or Above                    Optimal  130-159  mg/dL   Borderline  160-189  mg/dL   High  >190     mg/dL   Very High Performed at [facility] [address] [place], [place] [address]   Hemoglobin A1c     Status: Abnormal   Collection Time: 10/18/18  3:13 AM  Result Value Ref Range   Hgb A1c MFr Bld 6.5 (H) 4.8 - 5.6 %    Comment: (NOTE) Pre diabetes:          5.7%-6.4% Diabetes:              >  6.4% Glycemic control for   <7.0% adults with diabetes    Mean Plasma Glucose 139.85 mg/dL    Comment: Performed at [facility] [address] [place], [place] [address]  Comprehensive metabolic panel     Status: Abnormal   Collection Time: 10/18/18  3:13 AM  Result Value Ref Range   Sodium 141 135 - 145 mmol/L   Potassium 3.8 3.5 - 5.1 mmol/L   Chloride 109 98 - 111 mmol/L   CO2 22 22 - 32 mmol/L   Glucose, Bld 94 70 - 99 mg/dL   BUN 21 8 - 23 mg/dL   Creatinine, Ser 1.06 0.61 - 1.24 mg/dL   Calcium 8.9 8.9 - 10.3 mg/dL   Total Protein 6.3 (L) 6.5 - 8.1 g/dL   Albumin 3.7 3.5 - 5.0 g/dL   AST 22 15 - 41 U/L   ALT 18 0 - 44 U/L   Alkaline Phosphatase 48 38 - 126 U/L   Total Bilirubin 0.9 0.3 - 1.2 mg/dL   GFR calc non Af Amer 58 (L) >60 mL/min   GFR calc Af Amer >60 >60 mL/min    Comment: (NOTE) The eGFR has been calculated using the CKD EPI equation. This calculation has not been validated in all clinical situations. eGFR's persistently <60 mL/min signify possible Chronic Kidney Disease.    Anion gap 10 5 - 15    Comment: Performed at [facility] [address] [place] [address]  CBC     Status: None   Collection Time: 10/18/18  3:13 AM  Result Value Ref Range   WBC 8.1 4.0 - 10.5 K/uL   RBC 4.49 4.22 - 5.81 MIL/uL   Hemoglobin 13.8 13.0 - 17.0 g/dL   HCT 43.4 39.0 - 52.0 %   MCV 96.7 80.0 -  100.0 fL   MCH 30.7 26.0 - 34.0 pg   MCHC 31.8 30.0 - 36.0 g/dL   RDW 14.4 11.5 - 15.5 %   Platelets 281 150 - 400 K/uL   nRBC 0.0 0.0 - 0.2 %    Comment: Performed at [facility], [place] [address], [place], [place] [address]  CBG monitoring, ED     Status: Abnormal   Collection Time: 10/18/18  7:45 AM  Result Value Ref Range   Glucose-Capillary 112 (H) 70 - 99 mg/dL   Comment 1 Notify RN    Comment 2 Document in Chart   CBG monitoring, ED     Status: Abnormal   Collection Time: 10/18/18 11:46 AM  Result Value Ref Range   Glucose-Capillary 109 (H) 70 - 99 mg/dL   Comment 1 Notify RN    Comment 2 Document in Chart   Glucose, capillary     Status: None   Collection Time: 10/18/18  6:07 PM  Result Value Ref Range   Glucose-Capillary 80 70 - 99 mg/dL   Comment 1 Notify RN    Comment 2 Document in Chart   Glucose, capillary     Status: Abnormal   Collection Time: 10/18/18  9:14 PM  Result Value Ref Range   Glucose-Capillary 120 (H) 70 - 99 mg/dL   Comment 1 Notify RN    Comment 2 Document in Chart   Glucose, capillary     Status: Abnormal   Collection Time: 10/19/18  6:32 AM  Result Value Ref Range   Glucose-Capillary 109 (H) 70 - 99 mg/dL   Comment 1 Notify RN    Comment  2 Document in Chart    Ct Angio Head W Or Wo Contrast  Result Date: 10/18/2018 CLINICAL DATA:  84 y/o  M; stroke for follow-up. EXAM: CT ANGIOGRAPHY HEAD AND NECK TECHNIQUE: Multidetector CT imaging of the head and neck was performed using the standard protocol during bolus administration of intravenous contrast. Multiplanar CT image reconstructions and MIPs were obtained to evaluate the vascular anatomy. Carotid stenosis measurements (when applicable) are obtained utilizing NASCET criteria, using the distal internal carotid diameter as the denominator. CONTRAST:  50 cc Isovue 370 COMPARISON:  10/17/2018 MRI and CT of the head. 11/18/2015 CT head. FINDINGS: CTA NECK FINDINGS Aortic arch: Bovine variant  branching. Imaged portion shows no evidence of aneurysm or dissection. No significant stenosis of the major arch vessel origins. Atherosclerosis with mixed plaque. Right carotid system: No evidence of dissection, stenosis (50% or greater) or occlusion. Mild non stenotic calcific atherosclerosis of carotid bifurcation. Left carotid system: No evidence of dissection, stenosis (50% or greater) or occlusion. Vertebral arteries: Left dominant. No evidence of dissection, stenosis (50% or greater) or occlusion. Skeleton: Mild-to-moderate cervical spondylosis with multilevel disc and facet degenerative changes. No high-grade bony canal stenosis. C3-4 grade 1 anterolisthesis due to facet arthropathy. Other neck: Right anterior parotid nodule is increased in size from 2016 measuring 18 x 14 mm (AP by ML series 6, image 84). Upper chest: Status post CABG with LIMA and saphenous grafts, incompletely visualized. Healed upper median sternotomy. Review of the MIP images confirms the above findings CTA HEAD FINDINGS Anterior circulation: No significant stenosis, proximal occlusion, aneurysm, or vascular malformation. Mild non stenotic calcific atherosclerosis of carotid siphons. Posterior circulation: No significant stenosis, proximal occlusion, aneurysm, or vascular malformation. Mild right V4 stenosis. Venous sinuses: As permitted by contrast timing, patent. Anatomic variants: Complete circle-of-Willis. Fetal right PCA. Delayed phase: No abnormal intracranial enhancement. Review of the MIP images confirms the above findings IMPRESSION: 1. Patent carotid and vertebral arteries. No dissection, aneurysm, or hemodynamically significant stenosis utilizing NASCET criteria. 2. Patent anterior and posterior intracranial circulation. No large vessel occlusion, aneurysm, or significant stenosis. 3. Right anterior parotid nodule is increased in size from 2016. 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 If clinically indicated, consider MRI of the face with and without contrast for further evaluation and possible surgical consultation on a nonemergent basis. Electronically Signed   By: Kristine Garbe M.D.   On: 10/18/2018 01:31   Ct Head Wo Contrast  Result Date: 10/17/2018 CLINICAL DATA:  Unsteady gait. Leg weakness. Symptoms since this morning. EXAM: CT HEAD WITHOUT CONTRAST TECHNIQUE: Contiguous axial images were obtained from the base of the skull through the vertex without intravenous contrast. COMPARISON:  11/18/2015 FINDINGS: Brain: No evidence of acute infarction, hemorrhage, hydrocephalus, extra-axial collection or mass lesion/mass effect. There is mild ventricular and sulcal enlargement reflecting age-appropriate volume loss. Small deep white matter lacunar infarct in the left parietal lobe. Small old lacunar infarct in the left caudate nucleus head. Small old lacunar infarct, right cerebellum. Vascular: No hyperdense vessel or unexpected calcification. Skull: Normal. Negative for fracture or focal lesion. Sinuses/Orbits: Globes and orbits are unremarkable. The visualized sinuses and mastoid air cells are clear. Other: None. IMPRESSION: 1. No acute intracranial abnormalities. 2. Age-appropriate volume loss.  Three small old lacunar infarcts. Electronically Signed   By: Lajean Manes M.D.   On: 10/17/2018 18:39   Ct Angio Neck W Or Wo Contrast  Result  Date: 10/18/2018 CLINICAL DATA:  84 y/o  M; stroke for follow-up. EXAM: CT ANGIOGRAPHY HEAD AND NECK TECHNIQUE: Multidetector CT imaging of the head and neck was performed using the standard protocol during bolus administration of intravenous contrast. Multiplanar CT image reconstructions and MIPs were obtained to evaluate the vascular anatomy. Carotid stenosis measurements (when applicable) are obtained utilizing NASCET criteria, using the distal internal carotid diameter as  the denominator. CONTRAST:  50 cc Isovue 370 COMPARISON:  10/17/2018 MRI and CT of the head. 11/18/2015 CT head. FINDINGS: CTA NECK FINDINGS Aortic arch: Bovine variant branching. Imaged portion shows no evidence of aneurysm or dissection. No significant stenosis of the major arch vessel origins. Atherosclerosis with mixed plaque. Right carotid system: No evidence of dissection, stenosis (50% or greater) or occlusion. Mild non stenotic calcific atherosclerosis of carotid bifurcation. Left carotid system: No evidence of dissection, stenosis (50% or greater) or occlusion. Vertebral arteries: Left dominant. No evidence of dissection, stenosis (50% or greater) or occlusion. Skeleton: Mild-to-moderate cervical spondylosis with multilevel disc and facet degenerative changes. No high-grade bony canal stenosis. C3-4 grade 1 anterolisthesis due to facet arthropathy. Other neck: Right anterior parotid nodule is increased in size from 2016 measuring 18 x 14 mm (AP by ML series 6, image 84). Upper chest: Status post CABG with LIMA and saphenous grafts, incompletely visualized. Healed upper median sternotomy. Review of the MIP images confirms the above findings CTA HEAD FINDINGS Anterior circulation: No significant stenosis, proximal occlusion, aneurysm, or vascular malformation. Mild non stenotic calcific atherosclerosis of carotid siphons. Posterior circulation: No significant stenosis, proximal occlusion, aneurysm, or vascular malformation. Mild right V4 stenosis. Venous sinuses: As permitted by contrast timing, patent. Anatomic variants: Complete circle-of-Willis. Fetal right PCA. Delayed phase: No abnormal intracranial enhancement. Review of the MIP images confirms the above findings IMPRESSION: 1. Patent carotid and vertebral arteries. No dissection, aneurysm, or hemodynamically significant stenosis utilizing NASCET criteria. 2. Patent anterior and posterior intracranial circulation. No large vessel occlusion, aneurysm, or  significant stenosis. 3. Right anterior parotid nodule is increased in size from 2016. 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 If clinically indicated, consider MRI of the face with and without contrast for further evaluation and possible surgical consultation on a nonemergent basis. Electronically Signed   By: Kristine Garbe M.D.   On: 10/18/2018 01:31   Mr Brain Wo Contrast  Result Date: 10/17/2018 CLINICAL DATA:  Unsteady gait and lower extremity weakness. EXAM: MRI HEAD WITHOUT CONTRAST TECHNIQUE: Multiplanar, multiecho pulse sequences of the brain and surrounding structures were obtained without intravenous contrast. COMPARISON:  Head CT 10/17/2018 FINDINGS: BRAIN: Focus of abnormal diffusion restriction within the posterior left corona radiata. No other diffusion abnormality. The midline structures are normal. No midline shift or other mass effect. Old right cerebellar infarct. Old left caudate and left corona radiata infarcts. Mild periventricular white matter hyperintensity. Generalized atrophy without lobar predilection. Susceptibility-sensitive sequences show no chronic microhemorrhage or superficial siderosis. VASCULAR: Major intracranial arterial and venous sinus flow voids are normal. SKULL AND UPPER CERVICAL SPINE: Calvarial bone marrow signal is normal. There is no skull base mass. Visualized upper cervical spine and soft tissues are normal. SINUSES/ORBITS: No fluid levels or advanced mucosal thickening. No mastoid or middle ear effusion. The orbits are normal. IMPRESSION: 1. Small focus of acute ischemia within the posterior left corona radiata. No hemorrhage or mass effect. 2. Findings of chronic small vessel ischemia, including multiple old lacunar  infarcts. Electronically Signed   By: Ulyses Jarred M.D.   On: 10/17/2018 21:17       Medical Problem List and Plan: 1.  Decreased  functional ability with gait disturbance secondary to left corona radiata infarction.  Lacunar secondary to small vessel disease  -admit to inpatient rehab 2.  DVT Prophylaxis/Anticoagulation: Subcutaneous heparin.  No bleeding episodes 3. Pain Management: Tylenol as needed 4. Mood: Provide emotional support 5. Neuropsych: This patient is capable of making decisions on his own behalf. 6. Skin/Wound Care: Routine skin checks 7. Fluids/Electrolytes/Nutrition: Routine in and outs with follow-up chemistries 8.  Hypertension.  Permissive hypertension.  Patient on Tenormin 25 mg daily prior to admission.  Resume as needed 9.  Diabetes mellitus with peripheral neuropathy.  Hemoglobin A1c 6.5.  Glucotrol 5 mg daily.  Check blood sugars before meals and at bedtime 10.  CAD with CABG 1997.  No chest pain or shortness of breath.  Continue aspirin 11.  History of non-Hodgkin's lymphoma.  Follow-up as outpatient 12.  History of left foot drop.  Patient wears an AFO brace, Fairly worn. May need some upkeep.  13.  Hyperlipidemia.  Crestor/Lovaza  Post Admission Physician Evaluation: 1. Functional deficits secondary  to left corona radiata infarct. 2. Patient is admitted to receive collaborative, interdisciplinary care between the physiatrist, rehab nursing staff, and therapy team. 3. Patient's level of medical complexity and substantial therapy needs in context of that medical necessity cannot be provided at a lesser intensity of care such as a SNF. 4. Patient has experienced substantial functional loss from his/her baseline which was documented above under the "Functional History" and "Functional Status" headings.  Judging by the patient's diagnosis, physical exam, and functional history, the patient has potential for functional progress which will result in measurable gains while on inpatient rehab.  These gains will be of substantial and practical use upon discharge  in facilitating mobility and self-care at  the household level. 5. Physiatrist will provide 24 hour management of medical needs as well as oversight of the therapy plan/treatment and provide guidance as appropriate regarding the interaction of the two. 6. The Preadmission Screening has been reviewed and patient status is unchanged unless otherwise stated above. 7. 24 hour rehab nursing will assist with bladder management, bowel management, safety, skin/wound care, disease management, medication administration, pain management and patient education  and help integrate therapy concepts, techniques,education, etc. 8. PT will assess and treat for/with: Lower extremity strength, range of motion, stamina, balance, functional mobility, safety, adaptive techniques and equipment, NMR, family education.   Goals are: mod I to supervision. 9. OT will assess and treat for/with: ADL's, functional mobility, safety, upper extremity strength, adaptive techniques and equipment, NMR, family ed.   Goals are: mod I to supervision. Therapy may proceed with showering this patient. 10. SLP will assess and treat for/with: n/a.  Goals are: n/a. 11. Case Management and Social Worker will assess and treat for psychological issues and discharge planning. 12. Team conference will be held weekly to assess progress toward goals and to determine barriers to discharge. 13. Patient will receive at least 3 hours of therapy per day at least 5 days per week. 14. ELOS: 8-13 days       15. Prognosis:  excellent   I have personally performed a face to face diagnostic evaluation of this patient and formulated the key components of the plan.  Additionally, I have personally reviewed laboratory data, imaging studies, as well as relevant notes and concur with the  physician assistant's documentation above.  Meredith Staggers, MD, FAAPMR    Lavon Paganini [place], PA-C 10/19/2018

## 2018-10-19 NOTE — Progress Notes (Signed)
Physical Therapy Treatment Patient Details Name: Zachary Spence MRN: [id number] DOB: 08-21-1923 Today's Date: 10/19/2018    History of Present Illness PT is a 84 y/o male admitted secondary to weakness and pt reported "wobbling" during ambulation. 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 PMH includes DM, HTN, nonhodgkins lymphoma, and L foot drop at baseline.     PT Comments    Pt was in bed upon arrival with son at bedside. Pt was pleasant and stated that he was ready to be discharged. Pt required bil UE support to maintain standing balance and perform exercises. During gait pt was unsteady with turning, needed multiple VC/TC to stay close to walker and safe hand placement for sitting in chair. Plan to work on LE strength and standing balance.  Pt is progressing toward stated goals and would benefit from CIR.   Follow Up Recommendations  CIR;Supervision for mobility/OOB     Equipment Recommendations  None recommended by PT    Recommendations for Other Services Rehab consult     Precautions / Restrictions Precautions Precautions: Fall Required Braces or Orthoses: Other Brace/Splint Other Brace/Splint: Pt wears L AFO at baseline.  Restrictions Weight Bearing Restrictions: No    Mobility  Bed Mobility Overal bed mobility: Needs Assistance Bed Mobility: Supine to Sit     Supine to sit: Supervision     General bed mobility comments: Supervision and increased time to perform bed mobility tasks.   Transfers Overall transfer level: Needs assistance Equipment used: Rolling walker (2 wheeled) Transfers: Sit to/from Stand Sit to Stand: Min assist         General transfer comment: Min A for lift assist and steadying to come to standing. VC/TC required for proper hand placement  Ambulation/Gait Ambulation/Gait assistance: Mod assist;+2 safety/equipment Gait Distance (Feet): 10 Feet(Four trials performed due to poor fitting socks, adjustment of walker height, and  donning of L AFO  x 50 ft) Assistive device: Standard walker Gait Pattern/deviations: Narrow base of support;Trunk flexed;Decreased weight shift to left;Decreased dorsiflexion - left;Drifts right/left     General Gait Details: Unsteady when making turns. Noted decreased coordination and required mod A for steading assist. Pt required VC/TC for safe placement of RW, forward posture and detributing equal UE WB on walker; pt tends to bear more weight on R.   Stairs             Wheelchair Mobility    Modified Rankin (Stroke Patients Only) Modified Rankin (Stroke Patients Only) Pre-Morbid Rankin Score: No significant disability Modified Rankin: Moderately severe disability     Balance Overall balance assessment: Needs assistance Sitting-balance support: No upper extremity supported;Feet supported Sitting balance-Leahy Scale: Good     Standing balance support: Bilateral upper extremity supported Standing balance-Leahy Scale: Poor Standing balance comment: Reliant on RUE and external support.                              Cognition Arousal/Alertness: Awake/alert Behavior During Therapy: WFL for tasks assessed/performed Overall Cognitive Status: Within Functional Limits for tasks assessed                                        Exercises Total Joint Exercises Knee Flexion: AROM;10 reps;Other (comment);Both(Pt required Min guard with bil UE support for safety and to maintain balance) Marching in Standing: AROM;Both;10 reps;Other (comment)(Pt required min A for  safety and to maintain balance) Standing Hip Extension: AROM;Both;10 reps;Other (comment)(requiring bil UE support ) General Exercises - Lower Extremity Toe Raises: AROM;Both;10 reps    General Comments        Pertinent Vitals/Pain      Home Living   Living Arrangements: (66 year old son, [name])                  Prior Function            PT Goals (current goals can now be  found in the care plan section) Acute Rehab PT Goals Patient Stated Goal: to gain independence and then return home PT Goal Formulation: With patient Time For Goal Achievement: 11/01/18 Potential to Achieve Goals: Good Progress towards PT goals: Progressing toward goals    Frequency    Min 4X/week      PT Plan Current plan remains appropriate    Co-evaluation              AM-PAC PT "6 Clicks" Daily Activity  Outcome Measure  Difficulty turning over in bed (including adjusting bedclothes, sheets and blankets)?: A Little Difficulty moving from lying on back to sitting on the side of the bed? : A Little Difficulty sitting down on and standing up from a chair with arms (e.g., wheelchair, bedside commode, etc,.)?: A Little Help needed moving to and from a bed to chair (including a wheelchair)?: A Little Help needed walking in hospital room?: A Little Help needed climbing 3-5 steps with a railing? : Total 6 Click Score: 16    End of Session Equipment Utilized During Treatment: Gait belt Activity Tolerance: Patient tolerated treatment well Patient left: in chair;with call bell/phone within reach;with chair alarm set Nurse Communication: Mobility status PT Visit Diagnosis: Unsteadiness on feet (R26.81);Muscle weakness (generalized) (M62.81)     Time: 9798-9211 PT Time Calculation (min) (ACUTE ONLY): 29 min  Charges:  $Gait Training: 8-22 mins $Therapeutic Activity: 8-22 mins                     [address] SPTA   [place] 10/19/2018, 5:00 PM

## 2018-10-19 NOTE — Progress Notes (Signed)
STROKE TEAM PROGRESS NOTE     INTERVAL HISTORY He  remained stable.Marland Kitchen He is awaiting a bed on the rehabilitation unit  Vitals:   10/19/18 0000 10/19/18 0634 10/19/18 0744 10/19/18 1256  BP: (!) 101/57 129/69 117/71 103/63  Pulse: 74 69 71 67  Resp: 18 18 20 15   Temp: 98.4 F (36.9 C) 97.7 F (36.5 C) 97.8 F (36.6 C) 98.2 F (36.8 C)  TempSrc: Oral Oral Oral Oral  SpO2: 94% 91% 95% 95%  Weight:      Height:        CBC:  Recent Labs  Lab 10/17/18 1813 10/18/18 0016 10/18/18 0313  WBC 7.5 8.1 8.1  NEUTROABS 3.6  --   --   HGB 14.5 14.4 13.8  HCT 47.2 44.7 43.4  MCV 97.3 95.1 96.7  PLT 312 283 160    Basic Metabolic Panel:  Recent Labs  Lab 10/17/18 1813 10/18/18 0016 10/18/18 0313  NA 140  --  141  K 4.3  --  3.8  CL 109  --  109  CO2 23  --  22  GLUCOSE 101*  --  94  BUN 24*  --  21  CREATININE 1.13 1.12 1.06  CALCIUM 9.4  --  8.9   Lipid Panel:     Component Value Date/Time   CHOL 193 10/18/2018 0313   TRIG 112 10/18/2018 0313   HDL 39 (L) 10/18/2018 0313   CHOLHDL 4.9 10/18/2018 0313   VLDL 22 10/18/2018 0313   LDLCALC 132 (H) 10/18/2018 0313   HgbA1c:  Lab Results  Component Value Date   HGBA1C 6.5 (H) 10/18/2018   Urine Drug Screen: No results found for: LABOPIA, COCAINSCRNUR, LABBENZ, AMPHETMU, THCU, LABBARB  Alcohol Level No results found for: ETH  IMAGING Ct Angio Head W Or Wo Contrast  Result Date: 10/18/2018 CLINICAL DATA:  84 y/o  M; stroke for follow-up. EXAM: CT ANGIOGRAPHY HEAD AND NECK TECHNIQUE: Multidetector CT imaging of the head and neck was performed using the standard protocol during bolus administration of intravenous contrast. Multiplanar CT image reconstructions and MIPs were obtained to evaluate the vascular anatomy. Carotid stenosis measurements (when applicable) are obtained utilizing NASCET criteria, using the distal internal carotid diameter as the denominator. CONTRAST:  50 cc Isovue 370 COMPARISON:  10/17/2018 MRI  and CT of the head. 11/18/2015 CT head. FINDINGS: CTA NECK FINDINGS Aortic arch: Bovine variant branching. Imaged portion shows no evidence of aneurysm or dissection. No significant stenosis of the major arch vessel origins. Atherosclerosis with mixed plaque. Right carotid system: No evidence of dissection, stenosis (50% or greater) or occlusion. Mild non stenotic calcific atherosclerosis of carotid bifurcation. Left carotid system: No evidence of dissection, stenosis (50% or greater) or occlusion. Vertebral arteries: Left dominant. No evidence of dissection, stenosis (50% or greater) or occlusion. Skeleton: Mild-to-moderate cervical spondylosis with multilevel disc and facet degenerative changes. No high-grade bony canal stenosis. C3-4 grade 1 anterolisthesis due to facet arthropathy. Other neck: Right anterior parotid nodule is increased in size from 2016 measuring 18 x 14 mm (AP by ML series 6, image 84). Upper chest: Status post CABG with LIMA and saphenous grafts, incompletely visualized. Healed upper median sternotomy. Review of the MIP images confirms the above findings CTA HEAD FINDINGS Anterior circulation: No significant stenosis, proximal occlusion, aneurysm, or vascular malformation. Mild non stenotic calcific atherosclerosis of carotid siphons. Posterior circulation: No significant stenosis, proximal occlusion, aneurysm, or vascular malformation. Mild right V4 stenosis. Venous sinuses: As permitted by contrast  timing, patent. Anatomic variants: Complete circle-of-Willis. Fetal right PCA. Delayed phase: No abnormal intracranial enhancement. Review of the MIP images confirms the above findings IMPRESSION: 1. Patent carotid and vertebral arteries. No dissection, aneurysm, or hemodynamically significant stenosis utilizing NASCET criteria. 2. Patent anterior and posterior intracranial circulation. No large vessel occlusion, aneurysm, or significant stenosis. 3. Right anterior parotid nodule is increased in  size from 2016. 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 If clinically indicated, consider MRI of the face with and without contrast for further evaluation and possible surgical consultation on a nonemergent basis. Electronically Signed   By: Kristine Garbe M.D.   On: 10/18/2018 01:31   Ct Head Wo Contrast  Result Date: 10/17/2018 CLINICAL DATA:  Unsteady gait. Leg weakness. Symptoms since this morning. EXAM: CT HEAD WITHOUT CONTRAST TECHNIQUE: Contiguous axial images were obtained from the base of the skull through the vertex without intravenous contrast. COMPARISON:  11/18/2015 FINDINGS: Brain: No evidence of acute infarction, hemorrhage, hydrocephalus, extra-axial collection or mass lesion/mass effect. There is mild ventricular and sulcal enlargement reflecting age-appropriate volume loss. Small deep white matter lacunar infarct in the left parietal lobe. Small old lacunar infarct in the left caudate nucleus head. Small old lacunar infarct, right cerebellum. Vascular: No hyperdense vessel or unexpected calcification. Skull: Normal. Negative for fracture or focal lesion. Sinuses/Orbits: Globes and orbits are unremarkable. The visualized sinuses and mastoid air cells are clear. Other: None. IMPRESSION: 1. No acute intracranial abnormalities. 2. Age-appropriate volume loss.  Three small old lacunar infarcts. Electronically Signed   By: Lajean Manes M.D.   On: 10/17/2018 18:39   Ct Angio Neck W Or Wo Contrast  Result Date: 10/18/2018 CLINICAL DATA:  84 y/o  M; stroke for follow-up. EXAM: CT ANGIOGRAPHY HEAD AND NECK TECHNIQUE: Multidetector CT imaging of the head and neck was performed using the standard protocol during bolus administration of intravenous contrast. Multiplanar CT image reconstructions and MIPs were obtained to evaluate the vascular anatomy. Carotid stenosis measurements (when  applicable) are obtained utilizing NASCET criteria, using the distal internal carotid diameter as the denominator. CONTRAST:  50 cc Isovue 370 COMPARISON:  10/17/2018 MRI and CT of the head. 11/18/2015 CT head. FINDINGS: CTA NECK FINDINGS Aortic arch: Bovine variant branching. Imaged portion shows no evidence of aneurysm or dissection. No significant stenosis of the major arch vessel origins. Atherosclerosis with mixed plaque. Right carotid system: No evidence of dissection, stenosis (50% or greater) or occlusion. Mild non stenotic calcific atherosclerosis of carotid bifurcation. Left carotid system: No evidence of dissection, stenosis (50% or greater) or occlusion. Vertebral arteries: Left dominant. No evidence of dissection, stenosis (50% or greater) or occlusion. Skeleton: Mild-to-moderate cervical spondylosis with multilevel disc and facet degenerative changes. No high-grade bony canal stenosis. C3-4 grade 1 anterolisthesis due to facet arthropathy. Other neck: Right anterior parotid nodule is increased in size from 2016 measuring 18 x 14 mm (AP by ML series 6, image 84). Upper chest: Status post CABG with LIMA and saphenous grafts, incompletely visualized. Healed upper median sternotomy. Review of the MIP images confirms the above findings CTA HEAD FINDINGS Anterior circulation: No significant stenosis, proximal occlusion, aneurysm, or vascular malformation. Mild non stenotic calcific atherosclerosis of carotid siphons. Posterior circulation: No significant stenosis, proximal occlusion, aneurysm, or vascular malformation. Mild right V4 stenosis. Venous sinuses: As permitted by contrast timing, patent. Anatomic variants: Complete circle-of-Willis. Fetal right PCA. Delayed phase: No abnormal intracranial enhancement. Review  of the MIP images confirms the above findings IMPRESSION: 1. Patent carotid and vertebral arteries. No dissection, aneurysm, or hemodynamically significant stenosis utilizing NASCET criteria.  2. Patent anterior and posterior intracranial circulation. No large vessel occlusion, aneurysm, or significant stenosis. 3. Right anterior parotid nodule is increased in size from 2016. 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 If clinically indicated, consider MRI of the face with and without contrast for further evaluation and possible surgical consultation on a nonemergent basis. Electronically Signed   By: Kristine Garbe M.D.   On: 10/18/2018 01:31   Mr Brain Wo Contrast  Result Date: 10/17/2018 CLINICAL DATA:  Unsteady gait and lower extremity weakness. EXAM: MRI HEAD WITHOUT CONTRAST TECHNIQUE: Multiplanar, multiecho pulse sequences of the brain and surrounding structures were obtained without intravenous contrast. COMPARISON:  Head CT 10/17/2018 FINDINGS: BRAIN: Focus of abnormal diffusion restriction within the posterior left corona radiata. No other diffusion abnormality. The midline structures are normal. No midline shift or other mass effect. Old right cerebellar infarct. Old left caudate and left corona radiata infarcts. Mild periventricular white matter hyperintensity. Generalized atrophy without lobar predilection. Susceptibility-sensitive sequences show no chronic microhemorrhage or superficial siderosis. VASCULAR: Major intracranial arterial and venous sinus flow voids are normal. SKULL AND UPPER CERVICAL SPINE: Calvarial bone marrow signal is normal. There is no skull base mass. Visualized upper cervical spine and soft tissues are normal. SINUSES/ORBITS: No fluid levels or advanced mucosal thickening. No mastoid or middle ear effusion. The orbits are normal. IMPRESSION: 1. Small focus of acute ischemia within the posterior left corona radiata. No hemorrhage or mass effect. 2. Findings of chronic small vessel ischemia, including multiple old lacunar infarcts. Electronically Signed   By:  Ulyses Jarred M.D.   On: 10/17/2018 21:17    PHYSICAL EXAM Pleasant elderly Caucasian male not in distress. . Afebrile. Head is nontraumatic. Neck is supple without bruit.    Cardiac exam no murmur or gallop. Lungs are clear to auscultation. Distal pulses are well felt. Neurological Exam ;  Awake  Alert oriented x 3. Normal speech and language.eye movements full without nystagmus.fundi were not visualized. Vision acuity and fields appear normal. Hearing is normal. Palatal movements are normal. Face symmetric. Tongue midline. Normal strength, tone, reflexes and coordination except left foot drop which is old. Normal sensation except in the left lower extremity but it is diminished.. Gait deferred.   ASSESSMENT/PLAN [name] is a 84 y.o. male with history of HTN, HLD, DB,  epistaxis on aspirin, NHL presenting with dizziness, weakness and gait instability.   Stroke:  small left corona radiata infarct  Lacunar secondary to small vessel disease    CT head No acute abnormality. 3 old lacunes. age appropriate atrophy.      MRI  Small L CR infarct. Small vessel disease. Mult old lacunes.  CTA head & neck R anterior parotid node increased in size from 2016 2D Echo  Left ventricle: The cavity size was normal. Wall thickness was increased in a pattern of mild LVH. Systolic function was normal.The estimated ejection fraction was in the range of 55% to 60%. Aortic valve: There was trivial regurgitation. LDL 132  HgbA1c 6.5  Heparin 5000 units sq tid for VTE prophylaxis  No antithrombotic prior to admission due to spontaneous epistaxis, now on aspirin 325 mg daily. Given mild stroke, recommend aspirin 81 mg and plavix 75 mg daily x 3 weeks, then PLAVIX  alone. Orders adjusted.   Therapy recommendations:  CIR  Disposition:  pending   Hypertension  Stable . Permissive hypertension (OK if < 220/120) but gradually normalize in 5-7 days . Long-term BP goal  normotensive  Hyperlipidemia  Home meds:  zocor 20 every other day and fish oil, resumed in hospital  Decreased dose zocor to every other day due to arm pain at home  Will change zocor to crestor 5  LDL 132, goal < 70  Recommend the addition of CoQ10 200 mg at home to help with myalgias  Continue statin at discharge  Diabetes type II  HgbA1c 6.5, goal < 7.0  Controlled  Other Stroke Risk Factors  Advanced age  CAD s/p CABG  Hospital day # 2    Continue Dual antiplatelet therapy for 3 weeks followed by Plavix alone.transfer to inpatient rehabilitation when bed available. Stroke team will sign off. Follow-up as an outpatient in stroke clinic in 6 weeks.  Antony Contras, MD Medical Director [place] Pager: [phone] 10/19/2018 4:54 PM  To contact Stroke Continuity provider, please refer to [email or web address] After hours, contact General Neurology

## 2018-10-20 ENCOUNTER — Inpatient Hospital Stay (HOSPITAL_COMMUNITY): Payer: Medicare Other

## 2018-10-20 ENCOUNTER — Inpatient Hospital Stay (HOSPITAL_COMMUNITY): Payer: Medicare Other | Admitting: Physical Therapy

## 2018-10-20 ENCOUNTER — Inpatient Hospital Stay (HOSPITAL_COMMUNITY): Payer: Medicare Other | Admitting: Occupational Therapy

## 2018-10-20 DIAGNOSIS — I739 Peripheral vascular disease, unspecified: Secondary | ICD-10-CM

## 2018-10-20 DIAGNOSIS — M21372 Foot drop, left foot: Secondary | ICD-10-CM

## 2018-10-20 LAB — CBC WITH DIFFERENTIAL/PLATELET
ABS IMMATURE GRANULOCYTES: 0.03 10*3/uL (ref 0.00–0.07)
Basophils Absolute: 0.1 10*3/uL (ref 0.0–0.1)
Basophils Relative: 1 %
EOS ABS: 0.3 10*3/uL (ref 0.0–0.5)
Eosinophils Relative: 3 %
HEMATOCRIT: 47.3 % (ref 39.0–52.0)
Hemoglobin: 15.2 g/dL (ref 13.0–17.0)
IMMATURE GRANULOCYTES: 0 %
LYMPHS ABS: 3.6 10*3/uL (ref 0.7–4.0)
Lymphocytes Relative: 39 %
MCH: 30.6 pg (ref 26.0–34.0)
MCHC: 32.1 g/dL (ref 30.0–36.0)
MCV: 95.2 fL (ref 80.0–100.0)
MONO ABS: 1.1 10*3/uL — AB (ref 0.1–1.0)
MONOS PCT: 12 %
NEUTROS PCT: 45 %
Neutro Abs: 4.2 10*3/uL (ref 1.7–7.7)
Platelets: 299 10*3/uL (ref 150–400)
RBC: 4.97 MIL/uL (ref 4.22–5.81)
RDW: 14 % (ref 11.5–15.5)
WBC: 9.3 10*3/uL (ref 4.0–10.5)
nRBC: 0 % (ref 0.0–0.2)

## 2018-10-20 LAB — COMPREHENSIVE METABOLIC PANEL
ALT: 19 U/L (ref 0–44)
AST: 23 U/L (ref 15–41)
Albumin: 3.9 g/dL (ref 3.5–5.0)
Alkaline Phosphatase: 59 U/L (ref 38–126)
Anion gap: 9 (ref 5–15)
BILIRUBIN TOTAL: 1.2 mg/dL (ref 0.3–1.2)
BUN: 17 mg/dL (ref 8–23)
CO2: 29 mmol/L (ref 22–32)
CREATININE: 1.12 mg/dL (ref 0.61–1.24)
Calcium: 9.4 mg/dL (ref 8.9–10.3)
Chloride: 102 mmol/L (ref 98–111)
GFR, EST NON AFRICAN AMERICAN: 55 mL/min — AB (ref >60)
Glucose, Bld: 132 mg/dL — ABNORMAL HIGH (ref 70–99)
Potassium: 4 mmol/L (ref 3.5–5.1)
Sodium: 140 mmol/L (ref 135–145)
TOTAL PROTEIN: 7.1 g/dL (ref 6.5–8.1)

## 2018-10-20 LAB — GLUCOSE, CAPILLARY
GLUCOSE-CAPILLARY: 114 mg/dL — AB (ref 70–99)
GLUCOSE-CAPILLARY: 102 mg/dL — AB (ref 70–99)
Glucose-Capillary: 88 mg/dL (ref 70–99)
Glucose-Capillary: 94 mg/dL (ref 70–99)

## 2018-10-20 NOTE — Patient Care Conference (Addendum)
Inpatient RehabilitationTeam Conference and Plan of Care Update Date: 10/20/2018   Time: 11:00 AM    Patient Name: Zachary Spence      Medical Record Number: [id number]  Date of Birth: [birth year] Sex: Male         Room/Bed: 4W25C/4W25C-01 Payor Info: Payor: MEDICARE / Plan: MEDICARE PART A AND B / Product Type: *No Product type* /    Admitting Diagnosis: CVA  Admit Date/Time:  10/19/2018  4:42 PM Admission Comments: No comment available   Primary Diagnosis:  <principal problem not specified> Principal Problem: <principal problem not specified>  Patient Active Problem List   Diagnosis Date Noted  . Small vessel disease ([place]) 10/19/2018  . Benign essential HTN   . Stage 2 chronic kidney disease   . Sequela of lacunar infarction   . CVA (cerebral vascular accident) ([place]) 10/17/2018  . Burning sensation of the foot 04/12/2015  . Leg weakness 01/08/2015  . Carpal tunnel syndrome 12/26/2014  . Dropfoot 12/26/2014  . Neuralgia neuritis, sciatic nerve 12/26/2014  . Diabetes mellitus ([place]) 08/24/2014  . BP (high blood pressure) 08/24/2014  . HLD (hyperlipidemia) 08/24/2014  . H/O coronary artery bypass surgery 08/24/2014    Expected Discharge Date: Expected Discharge Date: (8 to 13 days)  Team Members Present: Physician leading conference: Dr. Alysia Penna Social Worker Present: Alfonse Alpers, LCSW Nurse Present: Rayetta Pigg, RN PT Present: Leavy Cella, PT OT Present: Roanna Epley, [place], OT SLP Present: Weston Anna, SLP PPS Coordinator present : Daiva Nakayama, RN, CRRN     Current Status/Progress Goal Weekly Team Focus  Medical   Patient without significant hemiparesis no significant cognitive deficits.  Maintain medical stability during rehab stay, reduce fall risk  Initiate rehabilitation program   Bowel/Bladder   Continent of Bowel amd Bladder LBM 10/19/18  Maintain continence  Assess QS/prn Toileting needs, provide prn as ordered or notify Medical  team within 2-3 days of no esults   Swallow/Nutrition/ Hydration             ADL's   eval pending         Mobility   eval pending         Communication             Safety/Cognition/ Behavioral Observations            Pain   Denies pain   < 2  Assess QS and prn    Skin   N o skin issues or concerns  Maintain skin integirity of skin breakdow or/and infection  Assess QS and prn    Rehab Goals Patient on target to meet rehab goals: Yes Rehab Goals Revised: none - first conference today on day of eval *See Care Plan and progress notes for long and short-term goals.     Barriers to Discharge  Current Status/Progress Possible Resolutions Date Resolved   Physician    Medical stability     Initial eval's in progress  See above      Nursing                  PT                    OT Other (comments)  none known at this time             SLP                SW  Discharge Planning/Teaching Needs:  Pt plans to return to his home he shares with his son.  Son can come for family education closer to d/c, as needed.   Team Discussion:  Pt with a minor stroke, who retired in 1988 and has gone to the gym 5 days/week since then.  Pt's son is involved.  Pt's main deficit is balance loss.  Dr. Letta Pate anticipates short LOS.  Therapists to still evaluate pt.  Pt is continent x2.  Revisions to Treatment Plan:  none    Continued Need for Acute Rehabilitation Level of Care: The patient requires daily medical management by a physician with specialized training in physical medicine and rehabilitation for the following conditions: Daily direction of a multidisciplinary physical rehabilitation program to ensure safe treatment while eliciting the highest outcome that is of practical value to the patient.: Yes Daily medical management of patient stability for increased activity during participation in an intensive rehabilitation regime.: Yes Daily analysis of laboratory values  and/or radiology reports with any subsequent need for medication adjustment of medical intervention for : Neurological problems   I attest that I was present, lead the team conference, and concur with the assessment and plan of the team.   Rickey Farrier, Silvestre Mesi 10/21/2018, 9:15 AM

## 2018-10-20 NOTE — Progress Notes (Signed)
Inpatient Rehabilitation- PPS Coordinator   Patient information reviewed and entered into eRehab system by [facility]. Loni Beckwith., CCC/SLP, PPS Coordinator.  Information including medical coding, functional ability and quality indicators will be reviewed and updated through discharge.    Per nursing patient was given "Data Collection Information Summary for Patients in Inpatient Rehabilitation Facilities with attached "Privacy Act [place] Records" upon admission.

## 2018-10-20 NOTE — Progress Notes (Addendum)
[place] PHYSICAL MEDICINE & REHABILITATION PROGRESS NOTE   Subjective/Complaints: Pt had nocturia x 3 yesterday, no dysuria or incont, no issus like  this at home  ROS  Denies CP, SOB, N/V/D   Objective:   No results found. Recent Labs    10/18/18 0016 10/18/18 0313  WBC 8.1 8.1  HGB 14.4 13.8  HCT 44.7 43.4  PLT 283 281   Recent Labs    10/17/18 1813 10/18/18 0016 10/18/18 0313  NA 140  --  141  K 4.3  --  3.8  CL 109  --  109  CO2 23  --  22  GLUCOSE 101*  --  94  BUN 24*  --  21  CREATININE 1.13 1.12 1.06  CALCIUM 9.4  --  8.9   No intake or output data in the 24 hours ending 10/20/18 0657   Physical Exam: Vital Signs Blood pressure (!) 98/53, pulse 75, temperature 97.9 F (36.6 C), resp. rate 16, height 6' (1.829 m), weight 83.7 kg, SpO2 94 %.   General: No acute distress Mood and affect are appropriate Heart: Regular rate and rhythm no rubs murmurs or extra sounds Lungs: Clear to auscultation, breathing unlabored, no rales or wheezes Abdomen: Positive bowel sounds, soft nontender to palpation, nondistended Extremities: No clubbing, cyanosis, or edema Skin: No evidence of breakdown, no evidence of rash Neurologic: Cranial nerves II through XII intact, motor strength is 5/5 in bilateral deltoid, bicep, tricep, grip, hip flexor, knee extensors, ankle dorsiflexor and plantar flexor except 2- Left ankle DF and toe ext 2- ankle PF Sensory exam normal sensation to light touch and proprioception in bilateral upper and lower extremities except Left side toes and dorsum of L foot,  Cerebellar exam normal finger to nose to finger as well as heel to shin in bilateral upper and lower extremities Musculoskeletal: Full range of motion in all 4 extremities. No joint swelling   Assessment/Plan: 1. Functional deficits secondary to LEFT corona radiata infarct which require 3+ hours per day of interdisciplinary therapy in a comprehensive inpatient rehab  setting.  Physiatrist is providing close team supervision and 24 hour management of active medical problems listed below.  Physiatrist and rehab team continue to assess barriers to discharge/monitor patient progress toward functional and medical goals  Care Tool:  Bathing              Bathing assist       Upper Body Dressing/Undressing Upper body dressing   What is the patient wearing?: Hospital gown only    Upper body assist Assist Level: Minimal Assistance - Patient > 75%    Lower Body Dressing/Undressing Lower body dressing            Lower body assist       Toileting Toileting    Toileting assist Assist for toileting: Moderate Assistance - Patient 50 - 74%     Transfers Chair/bed transfer  Transfers assist     Chair/bed transfer assist level: Moderate Assistance - Patient 50 - 74%     Locomotion Ambulation   Ambulation assist              Walk 10 feet activity   Assist           Walk 50 feet activity   Assist           Walk 150 feet activity   Assist           Walk 10 feet on uneven surface  activity   Assist           Wheelchair     Assist               Wheelchair 50 feet with 2 turns activity    Assist            Wheelchair 150 feet activity     Assist          Medical Problem List and Plan: 1.Decreased functional ability with gait disturbancesecondary toleft corona radiata infarction. Lacunar secondary to small vessel disease -CIR evals today 2. DVT Prophylaxis/Anticoagulation: Subcutaneous heparin. No bleeding episodes 3. Pain Management:Tylenol as needed 4. Mood:Provide emotional support 5. Neuropsych: This patientiscapable of making decisions on hisown behalf. 6. Skin/Wound Care:Routine skin checks 7. Fluids/Electrolytes/Nutrition:Routine in and outs with follow-up chemistries 8.Hypertension. Permissive hypertension. Patient on Tenormin  25 mg daily prior to admission. Resume as needed Controlled on 10/30 Vitals:   10/19/18 2053 10/20/18 0505  BP: 122/67 (!) 98/53  Pulse: 68 75  Resp: 17 16  Temp: 98.1 F (36.7 C) 97.9 F (36.6 C)  SpO2: 94% 94%  9.Diabetes mellitus with peripheral neuropathy. Hemoglobin A1c 6.5. Glucotrol 5 mg daily. Check blood sugars before meals and at bedtime CBG (last 3)  Recent Labs    10/19/18 1629 10/19/18 2140 10/20/18 0650  GLUCAP 81 132* 114*  controlled  10.CAD with CABG 1997. No chest pain or shortness of breath. Continue aspirin 11.History of non-Hodgkin's lymphoma. Follow-up asoutpatient 12.History of left foot drop. Patient wears an AFO brace since 2016, Fairly worn. May need some upkeep.Reviewed neuro notes, "sciatica" or compressive neuropathy I do not see MRI or EMG, no progression over time 13.Hyperlipidemia. Crestor/Lovaza    LOS: 1 days A FACE TO FACE EVALUATION WAS PERFORMED  Charlett Blake 10/20/2018, 6:57 AM

## 2018-10-20 NOTE — Progress Notes (Signed)
Social Work Assessment and Plan  Patient Details  Name: Zachary Spence MRN: [id number] Date of Birth: January 31, 1923  Today's Date: 10/20/2018  Problem List:  Patient Active Problem List   Diagnosis Date Noted  . Small vessel disease ([place]) 10/19/2018  . Benign essential HTN   . Stage 2 chronic kidney disease   . Sequela of lacunar infarction   . CVA (cerebral vascular accident) ([place]) 10/17/2018  . Burning sensation of the foot 04/12/2015  . Leg weakness 01/08/2015  . Carpal tunnel syndrome 12/26/2014  . Dropfoot 12/26/2014  . Neuralgia neuritis, sciatic nerve 12/26/2014  . Diabetes mellitus ([place]) 08/24/2014  . BP (high blood pressure) 08/24/2014  . HLD (hyperlipidemia) 08/24/2014  . H/O coronary artery bypass surgery 08/24/2014   Past Medical History:  Past Medical History:  Diagnosis Date  . Bowel obstruction ([place])   . Diabetes mellitus without complication ([place])   . Hypertension   . Non Hodgkin's lymphoma ([place])    Past Surgical History:  Past Surgical History:  Procedure Laterality Date  . APPENDECTOMY    . CORONARY ARTERY BYPASS GRAFT    . SPLENECTOMY     Social History:  reports that he has quit smoking. He has never used smokeless tobacco. He reports that he does not drink alcohol or use drugs.  Family / Support Systems Marital Status: Widow/Widower How Long?: 12 years Patient Roles: Parent Children: Zachary Spence - son - [phone] (h); [phone] (m) Anticipated Caregiver: son Ability/Limitations of Caregiver: son works Careers adviser: Intermittent Family Dynamics: close, supportive son  Social History Preferred language: English Religion: None Education: college Read: Yes Write: Yes Employment Status: Retired Date Retired/Disabled/Unemployed: 1988 Age Retired: 66 Public relations account executive Issues: none reported Guardian/Conservator: N/A - MD has determined that pt is capable of making his own decisions.    Abuse/Neglect Abuse/Neglect Assessment Can Be Completed: Yes Physical Abuse: Denies Verbal Abuse: Denies Sexual Abuse: Denies Exploitation of patient/patient's resources: Denies Self-Neglect: Denies  Emotional Status Pt's affect, behavior and adjustment status: Pt is upbeat and motivated to work in therapies. Recent Psychosocial Issues: none recently - wife died in February 21, 2006; dtr died in 02-21-11 and 3 months later son died Psychiatric History: none reported Substance Abuse History: none reported  Patient / Family Perceptions, Expectations & Goals Pt/Family understanding of illness & functional limitations: Pt/son have a good understanding of pt's condition/limitations and do not have any unanswered questions at this time. Premorbid pt/family roles/activities: Pt likes to go to [employer] daily and reads two newspapers, completes a crossword puzzle, then goes to work out at the gym/showers, and then goes out to lunch.  Pt also likes to read books.  Pt follows [employer] basketball. Anticipated changes in roles/activities/participation: Pt would like to resume activities as he is able. Pt/family expectations/goals: Pt wants to get moving/walking.  He would also like to be able to attend [facility] basketball games.  Community Resources [employer] Scripts: None Premorbid Home Care/DME Agencies: None Transportation available at discharge: family Resource referrals recommended: Support group (specify)([place] [employer] [facility])  Discharge Planning Living Arrangements: Children Support Systems: Children, Other relatives, Friends/neighbors Type of Residence: Private residence Insurance Resources: [employer], Multimedia programmer (specify)(AARP) Museum/gallery curator Resources: Radio broadcast assistant Screen Referred: No Living Expenses: Lives with family Money Management: Patient Does the patient have any problems obtaining your medications?: No Home Management: Pt and son share home management  responsibilities. Patient/Family Preliminary Plans: Pt plans to return to his home he shares with his son. Social Work Anticipated Follow  Up Needs: HH/OP, Support Group Expected length of stay: ELOS 8 to 13 days  Clinical Impression CSW met with pt and his son to introduce self and role of CSW, as well as to complete assessment.  Pt and son were talkative with CSW and extremely grateful to be able to come to CIR.  Pt is very active and would like to get back to that.  Son works long days, but has some flexibility to take time off as needed, but pt would be alone in large part during the day.  Pt has a cane, shower seat, grab bars at home.  Pt/son do not have any questions/concerns/needs at this time.  CSW explained that today is the day we predict how long pt will need to stay on CIR and they will agree to any recommendations.  CSW will continue to follow and assist as needed.  Rawlins Stuard, Silvestre Mesi 10/20/2018, 2:00 PM

## 2018-10-20 NOTE — Progress Notes (Signed)
Social Work Patient ID: Zachary Spence, male   DOB: June 24, 1923, 84 y.o.   MRN: [id number]   CSW met with pt/son to update them on team conference discussion.  Explained that we are just getting to know pt and setting LOS and goals, but that Dr. Letta Pate does not feel pt will need to be here for long, a week to 2.  CSW will keep pt/son updated and they were appreciative.

## 2018-10-20 NOTE — Evaluation (Addendum)
Physical Therapy Assessment and Plan  Patient Details  Name: Zachary Spence MRN: [id number] Date of Birth: November 24, 1923  PT Diagnosis: Abnormal posture, Abnormality of gait, Contracture of joint: L ankle, Hemiparesis dominant, Impaired sensation and Muscle weakness Rehab Potential: Good ELOS: 14   Today's Date: 10/20/2018 PT Individual Time: 0086-7619 PT Individual Time Calculation (min): 63 min    Problem List:  Patient Active Problem List   Diagnosis Date Noted  . Small vessel disease ([place]) 10/19/2018  . Benign essential HTN   . Stage 2 chronic kidney disease   . Sequela of lacunar infarction   . CVA (cerebral vascular accident) ([place]) 10/17/2018  . Burning sensation of the foot 04/12/2015  . Leg weakness 01/08/2015  . Carpal tunnel syndrome 12/26/2014  . Dropfoot 12/26/2014  . Neuralgia neuritis, sciatic nerve 12/26/2014  . Diabetes mellitus ([place]) 08/24/2014  . BP (high blood pressure) 08/24/2014  . HLD (hyperlipidemia) 08/24/2014  . H/O coronary artery bypass surgery 08/24/2014    Past Medical History:  Past Medical History:  Diagnosis Date  . Bowel obstruction ([place])   . Diabetes mellitus without complication ([place])   . Hypertension   . Non Hodgkin's lymphoma ([place])    Past Surgical History:  Past Surgical History:  Procedure Laterality Date  . APPENDECTOMY    . CORONARY ARTERY BYPASS GRAFT    . SPLENECTOMY      Assessment & Plan Clinical Impression:  Zachary Spence a 84 year old right-handed male history of CAD with CABG in 1997, hypertension, diabetes mellitus, non-Hodgkin's lymphoma and left foot drop. Presented 10/17/2018 with generalized weakness and gait instability read per chart review, patient, and son, patient lives with son. Independent with a cane and left AFO brace. He still drives. Son works during the day. One level home with one-step to entry. Cranial CT scan reviewed, unremarkable for acute intracranial process. Per report, 3 small old  lacunar infarctions. Patient did not receive TPA. MRI of the brain showed small focus of acute ischemia within the posterior left corona radiata. No hemorrhage or mass-effect. CT angiogram of head and neck with no dissection or aneurysm. Echocardiogram with ejection fraction of 60%. Systolic function was normal. Presently on aspirin and Plavix for CVA prophylaxis. Subcutaneous heparin for DVT prophylaxis. Tolerating a regular diet. Patient transferred to CIR on 10/19/2018 .   Patient currently requires mod with mobility secondary to muscle weakness and muscle joint tightness, decreased cardiorespiratoy endurance, impaired timing and sequencing, unbalanced muscle activation and decreased coordination and decreased sitting balance, decreased standing balance, decreased postural control, hemiplegia and decreased balance strategies.  Prior to hospitalization, patient was modified independent  with mobility and lived with Son in a House home.  Home access is 1 + 1Stairs to enter, no rails.  Patient will benefit from skilled PT intervention to maximize safe functional mobility, minimize fall risk and decrease caregiver burden for planned discharge home alone during the day while son [name] is at work.  Anticipate patient will benefit from follow up [place] at discharge.  PT - End of Session Activity Tolerance: Tolerates < 10 min activity with changes in vital signs Endurance Deficit: Yes Endurance Deficit Description: multiple rest breaks secondary fatigue PT Assessment Rehab Potential (ACUTE/IP ONLY): Good PT Patient demonstrates impairments in the following area(s): Balance;Endurance;Motor;Safety PT Transfers Functional Problem(s): Bed Mobility;Bed to Chair;Car;Furniture PT Locomotion Functional Problem(s): Ambulation;Wheelchair Mobility;Stairs PT Plan PT Intensity: Minimum of 1-2 x/day ,45 to 90 minutes PT Frequency: 5 out of 7 days PT Duration Estimated Length of Stay: 14  PT  Treatment/Interventions: Ambulation/gait training;Community reintegration;DME/adaptive equipment instruction;Neuromuscular re-education;Psychosocial support;Stair training;UE/LE Strength taining/ROM;Wheelchair propulsion/positioning;Discharge planning;Balance/vestibular training;Therapeutic Activities;UE/LE Coordination activities;Functional mobility training;Patient/family education;Splinting/orthotics;Therapeutic Exercise;Visual/perceptual remediation/compensation PT Transfers Anticipated Outcome(s): modified independent basic; superivison car PT Locomotion Anticipated Outcome(s): supervision w/c x 150' for activity tolerance, supervision gait x 150' in controlled env with LRAD and LAFO; modified independent gait household x 50' with LRAD and LAFO; supervision up/down 12 steps 2 rails; min assist up/down 1+1 steps no rails with LRAD PT Recommendation Recommendations for Other Services: Therapeutic Recreation consult Therapeutic Recreation Interventions: Kitchen group;Outing/community reintergration Follow Up Recommendations: Home health PT Patient destination: Home Equipment Recommended: To be determined Equipment Details: owns a [facility]  Skilled Therapeutic Intervention : Evaluation completed.  ELOS, schedule, purpose of PT explained to pt and his son [name].  Pt participated in w/c propulsion, gait, simulated car transfer, bed mobility.  Pt remembered 3/3 words over 10 minutes.  He is HOH.  Discussed falls precautions, as pt appeared to think that he would be able to walk to the BR with his son [name], using RW.  PT instructed pt and [name] that he needs to call for help every time, and that [name] will be trained when appropriate.  They verbalized understanding.  Pt left resting in bed with alarm set, needs at hand and son [name] with him.   PT Evaluation Precautions/Restrictions Precautions Precautions: Fall Required Braces or Orthoses: Other Brace/Splint Other Brace/Splint: Pt wears L AFO at  baseline.  Restrictions Weight Bearing Restrictions: No Other Position/Activity Restrictions: HOH Pain Pain Assessment Pain Scale: 0-10 Pain Score: 0-No pain Home Living/Prior Functioning Home Living Living Arrangements: Children Available Help at Discharge: Family;Available PRN/intermittently Type of Home: House Home Access: Stairs to enter Entrance Stairs-Number of Steps: 1 + 1 Entrance Stairs-Rails: None Home Layout: One level Bathroom Shower/Tub: Multimedia programmer: Handicapped height Bathroom Accessibility: Yes  Lives With: Son Prior Function Level of Independence: Independent with basic ADLs;Independent with homemaking with ambulation;Independent with transfers  Able to Take Stairs?: Yes Driving: Yes Vocation: Retired Leisure: Hobbies-yes (Comment) Comments: Pt uses cane and L AFO for ambulation. Reports he was still driving as well. Working out at gym several days a week Vision/Perception  Vision - Data processing manager Pursuits: Able to track stimulus in all quads without difficulty  Cognition Overall Cognitive Status: Within Functional Limits for tasks assessed Arousal/Alertness: Awake/alert Memory: Appears intact Sensation Sensation Light Touch: Impaired Detail Peripheral sensation comments: stocking distribution deficits bil feet; bil fingertips also affected Light Touch Impaired Details: Impaired LLE;Impaired RLE Proprioception: Appears Intact Coordination Gross Motor Movements are Fluid and Coordinated: No Fine Motor Movements are Fluid and Coordinated: Yes Motor  Motor Motor: Hemiplegia  Mobility Bed Mobility Bed Mobility: Rolling Right;Rolling Left;Left Sidelying to Sit Rolling Right: Independent Rolling Left: Supervision/Verbal cueing Left Sidelying to Sit: Supervision/Verbal cueing Transfers Transfers: Sit to Stand;Stand to Sit;Stand Pivot Transfers Sit to Stand: Moderate Assistance - Patient 50-74% Stand to Sit: Minimal  Assistance - Patient > 75% Stand Pivot Transfers: Moderate Assistance - Patient 50 - 74% Transfer (Assistive device): None Locomotion  Gait Ambulation: Yes Gait Assistance: Moderate Assistance - Patient 50-74% Gait Distance (Feet): 50 Feet Assistive device: None(LAFO) Gait Gait: Yes Gait Pattern: Impaired Gait Pattern: Step-through pattern;Decreased dorsiflexion - left;Decreased weight shift to right;Narrow base of support;Decreased trunk rotation Gait velocity: Decreased  Stairs / Additional Locomotion Stairs: No Wheelchair Mobility Wheelchair Mobility: Yes Wheelchair Assistance: Minimal assistance - Patient >75%(veers R) Wheelchair Propulsion: Both upper extremities Wheelchair Parts Management: Needs assistance Distance: 100  Trunk/Postural Assessment  Cervical Assessment Cervical Assessment: Exceptions to WFL(forward head) Thoracic Assessment Thoracic Assessment: Exceptions to WFL(kyphotic) Lumbar Assessment Lumbar Assessment: Exceptions to WFL(posterior pelvic tilt)  Balance Balance Balance Assessed: Yes Dynamic Sitting Balance Dynamic Sitting - Level of Assistance: 4: Min assist;5: Stand by assistance Dynamic Standing Balance Dynamic Standing - Level of Assistance: 3: Mod assist Extremity Assessment  RLE Assessment RLE Assessment: Exceptions to [facility] Passive Range of Motion (PROM) Comments: tight heel cords and hamstrings General Strength Comments: ankle DF 4/5 LLE Assessment LLE Assessment: Exceptions to [facility] Passive Range of Motion (PROM) Comments: tight heel cords and hamstrings Active Range of Motion (AROM) Comments:     General Strength Comments: L ankle DF 2-/5    Refer to Care Plan for Long Term Goals  Recommendations for other services: Therapeutic Recreation  Kitchen group and Outing/community reintegration  Discharge Criteria: Patient will be discharged from PT if patient refuses treatment 3 consecutive times without medical reason, if treatment goals  not met, if there is a change in medical status, if patient makes no progress towards goals or if patient is discharged from hospital.  The above assessment, treatment plan, treatment alternatives and goals were discussed and mutually agreed upon: by patient  Zachary Spence 10/20/2018, 5:38 PM

## 2018-10-20 NOTE — Evaluation (Signed)
Occupational Therapy Assessment and Plan  Patient Details  Name: Zachary Spence MRN: [id number] Date of Birth: 07/08/1923  OT Diagnosis: abnormal posture, muscle weakness (generalized) and hemiplegia Rehab Potential: Rehab Potential (ACUTE ONLY): Good ELOS: 10-14 days   Today's Date: 10/20/2018 OT Individual Time: 1300-1400 OT Individual Time Calculation (min): 60 min     Problem List:  Patient Active Problem List   Diagnosis Date Noted  . Small vessel disease ([facility]) 10/19/2018  . Benign essential HTN   . Stage 2 chronic kidney disease   . Sequela of lacunar infarction   . CVA (cerebral vascular accident) ([facility]) 10/17/2018  . Burning sensation of the foot 04/12/2015  . Leg weakness 01/08/2015  . Carpal tunnel syndrome 12/26/2014  . Dropfoot 12/26/2014  . Neuralgia neuritis, sciatic nerve 12/26/2014  . Diabetes mellitus ([facility]) 08/24/2014  . BP (high blood pressure) 08/24/2014  . HLD (hyperlipidemia) 08/24/2014  . H/O coronary artery bypass surgery 08/24/2014    Past Medical History:  Past Medical History:  Diagnosis Date  . Bowel obstruction ([facility])   . Diabetes mellitus without complication ([facility])   . Hypertension   . Non Hodgkin's lymphoma ([facility])    Past Surgical History:  Past Surgical History:  Procedure Laterality Date  . APPENDECTOMY    . CORONARY ARTERY BYPASS GRAFT    . SPLENECTOMY      Assessment & Plan Clinical Impression: Patient is a 84 y.o. year old male with history of CAD with CABG in 1997, hypertension, diabetes mellitus, non-Hodgkin's lymphoma and left foot drop. Presented 10/17/2018 with generalized weakness and gait instability read per chart review, patient, and son, patient lives with son. Independent with a cane and left AFO brace. He still drives. Son works during the day. One level home with one-step to entry. Cranial CT scan reviewed, unremarkable for acute intracranial process. 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 Patient did not  receive TPA. MRI of the brain showed small focus of acute ischemia within the posterior left corona radiata. No hemorrhage or mass-effect. CT angiogram of head and neck with no dissection or aneurysm. Echocardiogram with ejection fraction of 60%. Systolic function was normal. Presently on aspirin and Plavix for CVA prophylaxis. Subcutaneous heparin for DVT prophylaxis. Tolerating a regular diet.  .  Patient transferred to CIR on 10/19/2018 .    Patient currently requires mod with basic self-care skills secondary to muscle weakness, decreased cardiorespiratoy endurance and decreased sitting balance, decreased standing balance, hemiplegia and decreased balance strategies.  Prior to hospitalization, patient could complete ADLs with modified independent .  Patient will benefit from skilled intervention to increase independence with basic self-care skills prior to discharge home with care partner.  Anticipate patient will require intermittent supervision and follow up home health.  OT - End of Session Activity Tolerance: Decreased this session Endurance Deficit: Yes Endurance Deficit Description: multiple rest breaks secondary fatigue OT Assessment Rehab Potential (ACUTE ONLY): Good OT Barriers to Discharge: Other (comments) OT Barriers to Discharge Comments: none known at this time OT Patient demonstrates impairments in the following area(s): Balance;Endurance;Motor;Safety OT Basic ADL's Functional Problem(s): Grooming;Bathing;Dressing;Toileting OT Transfers Functional Problem(s): Toilet;Tub/Shower OT Plan OT Intensity: Minimum of 1-2 x/day, 45 to 90 minutes OT Frequency: 5 out of 7 days OT Duration/Estimated Length of Stay: 10-14 days OT Treatment/Interventions: Balance/vestibular training;DME/adaptive equipment instruction;Patient/family education;Therapeutic Activities;Therapeutic Exercise;Psychosocial support;Community reintegration;Functional mobility training;Self Care/advanced ADL  retraining;UE/LE Strength taining/ROM;Discharge planning;UE/LE Coordination activities OT Self Feeding Anticipated Outcome(s): n/a OT Basic Self-Care Anticipated Outcome(s): mod I OT Toileting   Anticipated Outcome(s): mod I  OT Bathroom Transfers Anticipated Outcome(s): mod I toilet and supervision for shower OT Recommendation Recommendations for Other Services: Other (comment)(none at this time) Patient destination: Home Follow Up Recommendations: Home health OT Equipment Recommended: None recommended by OT   Skilled Therapeutic Intervention Upon entering the room, pt supine in bed with son present in room for observation. OT educated pt and son on OT purpose, POC, and goals with them verbalizing understanding and agreement. Pt reports his goal is to be "independent". Pt standing with lifting assistance and ambulating with RW without AFO donned 10' into bathroom with mod A for balance. Pt transferred onto TTB with min A and doffing clothing items with min verbal cues. Pt remained seated with assistance to wash buttocks and B LEs. Pt exiting the bathroom in same manner and seated on edge of bed with focus on sit <>stand for LB clothing management. Pt required mod A standing balance with posterior bias. Pt required max cuing for forward weight shift while seated secondary to fatigue. Pt transferred into recliner chair with mod A stand pivot transfer. Call bell and all needed items within reach with son present in room.  OT Evaluation Precautions/Restrictions  Precautions Precautions: Fall Required Braces or Orthoses: Other Brace/Splint Other Brace/Splint: Pt wears L AFO at baseline.  Restrictions Weight Bearing Restrictions: No Other Position/Activity Restrictions: HOH Vital Signs Therapy Vitals Temp: 97.6 F (36.4 C) Pulse Rate: 72 Resp: 18 BP: (!) 102/48(RN notified ) Patient Position (if appropriate): Sitting Oxygen Therapy SpO2: 93 % O2 Device: Room Air Pain Pain  Assessment Pain Scale: 0-10 Pain Score: 0-No pain Home Living/Prior Functioning Home Living Family/patient expects to be discharged to:: Private residence Living Arrangements: Children Available Help at Discharge: Family, Available PRN/intermittently Type of Home: House Home Access: Stairs to enter Entrance Stairs-Number of Steps: 1 + 1 Entrance Stairs-Rails: None Home Layout: One level Bathroom Shower/Tub: Walk-in shower Bathroom Toilet: Handicapped height Bathroom Accessibility: Yes  Lives With: Son Prior Function Level of Independence: Independent with basic ADLs, Independent with homemaking with ambulation, Independent with transfers  Able to Take Stairs?: Yes Driving: Yes Vocation: Retired Leisure: Hobbies-yes (Comment) Comments: Pt uses cane and L AFO for ambulation. Reports he was still driving as well. Working out at gym several days a week Vision Baseline Vision/History: Wears glasses(R macular degeneration) Wears Glasses: At all times Patient Visual Report: No change from baseline Vision Assessment?: Yes Tracking/Visual Pursuits: Able to track stimulus in all quads without difficulty Cognition Overall Cognitive Status: Within Functional Limits for tasks assessed Arousal/Alertness: Awake/alert Orientation Level: Person;Place;Situation Person: Oriented Place: Oriented Situation: Oriented Year: 2019 Month: October Day of Week: Correct Memory: Appears intact Immediate Memory Recall: Sock;Blue;Bed Memory Recall: Sock;Blue(2/3) Memory Recall Sock: Without Cue Memory Recall Blue: Without Cue Sensation Sensation Light Touch: Impaired Detail Peripheral sensation comments: stocking distribution deficits bil feet Light Touch Impaired Details: Impaired LLE;Impaired RLE Proprioception: Appears Intact Coordination Gross Motor Movements are Fluid and Coordinated: No Fine Motor Movements are Fluid and Coordinated: Yes Motor  Motor Motor: Hemiplegia Mobility  Bed  Mobility Bed Mobility: Rolling Right;Rolling Left;Left Sidelying to Sit Rolling Right: Independent Rolling Left: Supervision/Verbal cueing Left Sidelying to Sit: Supervision/Verbal cueing Transfers Sit to Stand: Moderate Assistance - Patient 50-74% Stand to Sit: Minimal Assistance - Patient > 75%  Trunk/Postural Assessment  Cervical Assessment Cervical Assessment: Exceptions to WFL(forward head) Thoracic Assessment Thoracic Assessment: Exceptions to WFL(kyphotic) Lumbar Assessment Lumbar Assessment: Exceptions to WFL(posterior pelvic tilt)  Balance Balance Balance Assessed: Yes Dynamic   Sitting Balance Dynamic Sitting - Level of Assistance: 4: Min assist;5: Stand by assistance Dynamic Standing Balance Dynamic Standing - Level of Assistance: 3: Mod assist Extremity/Trunk Assessment RUE Assessment RUE Assessment: Within Functional Limits LUE Assessment LUE Assessment: Within Functional Limits     Refer to Care Plan for Long Term Goals  Recommendations for other services: None    Discharge Criteria: Patient will be discharged from OT if patient refuses treatment 3 consecutive times without medical reason, if treatment goals not met, if there is a change in medical status, if patient makes no progress towards goals or if patient is discharged from hospital.  The above assessment, treatment plan, treatment alternatives and goals were discussed and mutually agreed upon: by patient  Bradsher, Katie P 10/20/2018, 4:02 PM  

## 2018-10-20 NOTE — Progress Notes (Signed)
Physical Therapy Session Note  Patient Details  Name: Zachary Spence MRN: [id number] Date of Birth: 11/28/1923  Today's Date: 10/20/2018 PT Individual Time: 1500-1625 PT Individual Time Calculation (min): 85 min   Short Term Goals: Week 1:     Skilled Therapeutic Interventions/Progress Updates:    no c/o pain.  Session focus on NMR for balance, overall activity tolerance, and functional mobility.   Pt transfers throughout session with min assist without AD and CGA with RW.  Verbal cues for pacing and attention to task for safety.  W/C mobility throughout unit, max distance 150', with BUEs for strengthening, activity tolerance, and NMR for equal push through RUE/LUE.  Standing balance during horse shoe task from firm surface>toes on wedge>full foot on wedge with single UE support on RW reaching for horse shoes with supervision progress to min assist for standing on foam wedge.  Gait training 2x75' with RW and min assist for balance, cues for pacing and forward gaze.  Stair negotiation 2x4 steps with 2 rails, no rest break between sets, min assist and verbal cues for sequencing, pt occasionally uses reciprocal pattern to descend steps with no LOB or buckling noted.  Alternating toe taps to 6" step x3 trials to fatigue, mod/max on first trial, min/mod on second two trials with verbal cues for weight shifting R<>L to fully unload working LE.  Nustep at level 6 x8 minutes focus on attention to foot placement, reciprocal stepping pattern retraining, and cardiovascular endurance.  Pt returned to room at end of session and positioned in recliner, son present at bedside, call bell in reach and needs met.   Therapy Documentation Precautions:  Precautions Precautions: Fall Required Braces or Orthoses: Other Brace/Splint Other Brace/Splint: Pt wears L AFO at baseline.  Restrictions Weight Bearing Restrictions: No Other Position/Activity Restrictions: HOH    Therapy/Group: Individual  Therapy  Michel Santee 10/20/2018, 4:33 PM

## 2018-10-20 NOTE — Progress Notes (Signed)
[facility] Individual Statement of Services  Patient Name:  Zachary Spence  Date:  10/20/2018  Welcome to the [facility].  Our goal is to provide you with an individualized program based on your diagnosis and situation, designed to meet your specific needs.  With this comprehensive rehabilitation program, you will be expected to participate in at least 3 hours of rehabilitation therapies Monday-Friday, with modified therapy programming on the weekends.  Your rehabilitation program will include the following services:  Physical Therapy (PT), Occupational Therapy (OT), 24 hour per day rehabilitation nursing, Case Management (Social Worker), Rehabilitation Medicine, Nutrition Services and Pharmacy Services  Weekly team conferences will be held on Wednesdays to discuss your progress.  Your Social Worker will talk with you frequently to get your input and to update you on team discussions.  Team conferences with you and your family in attendance may also be held.  Expected length of stay: 10 to 14 days  Overall anticipated outcome:  Independent with assistive device; supervision with ambulation in the community; minimal assistance for stairs  Depending on your progress and recovery, your program may change. Your Social Worker will coordinate services and will keep you informed of any changes. Your Social Worker's name and contact numbers are listed  below.  The following services may also be recommended but are not provided by the [facility] will be made to provide these services after discharge if needed.  Arrangements include referral to agencies that provide these services.  Your insurance has been verified to be:  Medicare and [place] Your primary doctor is:  Dr. Tracie Harrier  Pertinent information will be shared  with your doctor and your insurance company.  Social Worker:  Alfonse Alpers, LCSW  [phone] or [phone]  Information discussed with and copy given to patient by: Trey Sailors, 10/20/2018, 2:15 PM

## 2018-10-21 ENCOUNTER — Inpatient Hospital Stay (HOSPITAL_COMMUNITY): Payer: Medicare Other | Admitting: Physical Therapy

## 2018-10-21 ENCOUNTER — Inpatient Hospital Stay (HOSPITAL_COMMUNITY): Payer: Medicare Other

## 2018-10-21 LAB — GLUCOSE, CAPILLARY
GLUCOSE-CAPILLARY: 121 mg/dL — AB (ref 70–99)
Glucose-Capillary: 89 mg/dL (ref 70–99)
Glucose-Capillary: 90 mg/dL (ref 70–99)

## 2018-10-21 NOTE — Progress Notes (Signed)
[place] PHYSICAL MEDICINE & REHABILITATION PROGRESS NOTE   Subjective/Complaints:  No issues overnite, no problems in therapy  ROS  Denies CP, SOB, N/V/D   Objective:   No results found. Recent Labs    10/20/18 0612  WBC 9.3  HGB 15.2  HCT 47.3  PLT 299   Recent Labs    10/20/18 0612  NA 140  K 4.0  CL 102  CO2 29  GLUCOSE 132*  BUN 17  CREATININE 1.12  CALCIUM 9.4    Intake/Output Summary (Last 24 hours) at 10/21/2018 0836 Last data filed at 10/21/2018 0745 Gross per 24 hour  Intake 1020 ml  Output 1 ml  Net 1019 ml     Physical Exam: Vital Signs Blood pressure 100/69, pulse 91, temperature 97.8 F (36.6 C), resp. rate 18, height 6' (1.829 m), weight 83.7 kg, SpO2 96 %.   General: No acute distress Mood and affect are appropriate Heart: Regular rate and rhythm no rubs murmurs or extra sounds Lungs: Clear to auscultation, breathing unlabored, no rales or wheezes Abdomen: Positive bowel sounds, soft nontender to palpation, nondistended Extremities: No clubbing, cyanosis, or edema Skin: No evidence of breakdown, no evidence of rash Neurologic: Cranial nerves II through XII intact, motor strength is 5/5 in bilateral deltoid, bicep, tricep, grip, hip flexor, knee extensors, ankle dorsiflexor and plantar flexor except 2- Left ankle DF and toe ext 2- ankle PF Sensory exam normal sensation to light touch and proprioception in bilateral upper and lower extremities except Left side toes and dorsum of L foot,  Cerebellar exam normal finger to nose to finger as well as heel to shin in bilateral upper and lower extremities Musculoskeletal: Full range of motion in all 4 extremities. No joint swelling   Assessment/Plan: 1. Functional deficits secondary to LEFT corona radiata infarct which require 3+ hours per day of interdisciplinary therapy in a comprehensive inpatient rehab setting.  Physiatrist is providing close team supervision and 24 hour management of  active medical problems listed below.  Physiatrist and rehab team continue to assess barriers to discharge/monitor patient progress toward functional and medical goals  Care Tool:  Bathing    Body parts bathed by patient: Right arm, Left arm, Chest, Abdomen, Front perineal area, Right upper leg, Left upper leg, Face   Body parts bathed by helper: Buttocks, Right lower leg, Left lower leg     Bathing assist Assist Level: Moderate Assistance - Patient 50 - 74%     Upper Body Dressing/Undressing Upper body dressing   What is the patient wearing?: Pull over shirt    Upper body assist Assist Level: Set up assist    Lower Body Dressing/Undressing Lower body dressing      What is the patient wearing?: Underwear/pull up, Pants     Lower body assist Assist for lower body dressing: Contact Guard/Touching assist     Toileting Toileting    Toileting assist Assist for toileting: Minimal Assistance - Patient > 75%     Transfers Chair/bed transfer  Transfers assist     Chair/bed transfer assist level: Minimal Assistance - Patient > 75%     Locomotion Ambulation   Ambulation assist      Assist level: Minimal Assistance - Patient > 75% Assistive device: Walker-rolling Max distance: 75   Walk 10 feet activity   Assist     Assist level: Minimal Assistance - Patient > 75% Assistive device: Walker-rolling   Walk 50 feet activity   Assist    Assist level:  Minimal Assistance - Patient > 75% Assistive device: Walker-rolling    Walk 150 feet activity   Assist           Walk 10 feet on uneven surface  activity   Assist Walk 10 feet on uneven surfaces activity did not occur: Safety/medical concerns(fatigue)         Wheelchair     Assist Will patient use wheelchair at discharge?: No Type of Wheelchair: Manual    Wheelchair assist level: Supervision/Verbal cueing Max wheelchair distance: 150    Wheelchair 50 feet with 2 turns  activity    Assist        Assist Level: Supervision/Verbal cueing   Wheelchair 150 feet activity     Assist Wheelchair 150 feet activity did not occur: Safety/medical concerns(fatigue)   Assist Level: Supervision/Verbal cueing    Medical Problem List and Plan: 1.Decreased functional ability with gait disturbancesecondary toleft corona radiata infarction. Lacunar secondary to small vessel disease -CIR PT, OT 2. DVT Prophylaxis/Anticoagulation: Subcutaneous heparin. No bleeding episodes 3. Pain Management:Tylenol as needed 4. Mood:Provide emotional support 5. Neuropsych: This patientiscapable of making decisions on hisown behalf. 6. Skin/Wound Care:Routine skin checks 7. Fluids/Electrolytes/Nutrition:Routine in and outs with follow-up chemistries 8.Hypertension. Permissive hypertension. Patient on Tenormin 25 mg daily prior to admission. Resume as needed Controlled on 10/31 Vitals:   10/20/18 1910 10/21/18 0647  BP: 107/60 100/69  Pulse: 72 91  Resp: 16 18  Temp: 98.1 F (36.7 C) 97.8 F (36.6 C)  SpO2: 96% 96%  9.Diabetes mellitus with peripheral neuropathy. Hemoglobin A1c 6.5. Glucotrol 5 mg daily. Check blood sugars before meals and at bedtime CBG (last 3)  Recent Labs    10/20/18 1652 10/20/18 2108 10/21/18 0638  GLUCAP 88 94 121*  controlled  10.CAD with CABG 1997. No chest pain or shortness of breath. Continue aspirin 11.History of non-Hodgkin's lymphoma. Follow-up asoutpatient 12.History of left foot drop. Patient wears an AFO brace since 2016, Fairly worn. May need some upkeep.Reviewed neuro notes, "sciatica" or compressive neuropathy I do not see MRI or EMG, no progression over time 13.Hyperlipidemia. Crestor/Lovaza    LOS: 2 days A FACE TO FACE EVALUATION WAS PERFORMED  Charlett Blake 10/21/2018, 8:36 AM

## 2018-10-21 NOTE — Progress Notes (Signed)
Occupational Therapy Session Note  Patient Details  Name: Zachary Spence MRN: [id number] Date of Birth: 1923-05-10  Today's Date: 10/21/2018 OT Individual Time: 1300-1345 OT Individual Time Calculation (min): 45 min    Short Term Goals: Week 1:  OT Short Term Goal 1 (Week 1): Pt will perform shower transfer with min A. OT Short Term Goal 2 (Week 1): Pt will maintain balance with close supervision during LB clothing management.  OT Short Term Goal 3 (Week 1): Pt will perform bathing tasks with supervision and use of AE as needed for safety.   Skilled Therapeutic Interventions/Progress Updates:    OT intervention with focus on functional amb with SPC, toilet transfers, toielting, and dynamic standing balance during functional activities.  Pt amb with SPC to ADL apartment (rest breaks X 1) and engaged in home mgmt activity to challenge dynamic standing balance and household amb without AD.  Pt replaced bed linens on bed without use of SPC, using bed to assist with balance.  Pt required rest break X 1 during task.  Pt also engaged in functional amb tasks in ADL apartment to remove items from drawers and take to bathroom to place in dirty linen bag.  Pt required CGA during ambulation.  Pt amb with SPC back to room and requested to use toilet prior to returning to recliner.  Pt remained in recliner with all needs within reach and chair alarm activated.   Therapy Documentation Precautions:  Precautions Precautions: Fall Required Braces or Orthoses: Other Brace/Splint Other Brace/Splint: Pt wears L AFO at baseline.  Restrictions Weight Bearing Restrictions: No Other Position/Activity Restrictions: HOH   Pain:  Pt denies pain   Therapy/Group: Individual Therapy  Leroy Libman 10/21/2018, 2:47 PM

## 2018-10-21 NOTE — Progress Notes (Signed)
Physical Therapy Session Note  Patient Details  Name: Zachary Spence MRN: [id number] Date of Birth: 1923-08-22  Today's Date: 10/21/2018 PT Individual Time: 0900-0955 AND 4098-1191 PT Individual Time Calculation (min): 55 min AND 25 min  Short Term Goals: Week 1:  PT Short Term Goal 1 (Week 1): pt will transfer bed>< w/c wiht min assist consistently PT Short Term Goal 2 (Week 1): pt will propel w/c x 150' wiht supervision PT Short Term Goal 3 (Week 1): pt will perform gait iwth LRAD and LAFO x 150' with min assist/close supervision PT Short Term Goal 4 (Week 1): pt will ascend/descend 4 steps 2 rails with min assist  Skilled Therapeutic Interventions/Progress Updates:   Session 1:  Pt in supine and agreeable to therapy, denies pain. Provided supervision to change LE garments and don socks/shoes/RAFO. Stand pivot transfer to w/c w/ min assist. Pt self-propelled w/c to therapy gym w/ supervision to work on [place] coordination. Worked on gait and standing balance tasks w/o UE support. Ambulated 150' x2 w/o AD and 100' w/ SPC, w/ min assist overall, mod assist to correct 1 LOB. Verbal cues for upright posture and to increase BOS. Intermittent scissoring noted, pt able to self-correct. Standing balance tasks emphasized trunk rotation and anterior weight shifting w/ reaching to pin clothespins. Started w/ normal stance progressing to feet together, min assist overall. Pt self-propelled w/c back to room, ended session in w/c, all needs in reach.   Session 2:  Pt in recliner and agreeable to therapy, denies pain. Son present, provided skilled education to son throughout session regarding pt's CLOF and anticipated d/c disposition. Both son and pt happy to see pt ambulating w/ SPC again. Ambulated around unit in multiple >150' bouts w/ min assist using SPC. Verbal cues for gait pattern, upright posture, and for increased BOS to prevent scissoring. Returned to room and ended session in recliner and all  needs in reach.  Therapy Documentation Precautions:  Precautions Precautions: Fall Required Braces or Orthoses: Other Brace/Splint Other Brace/Splint: Pt wears L AFO at baseline.  Restrictions Weight Bearing Restrictions: No Other Position/Activity Restrictions: HOH Vital Signs: Therapy Vitals Temp: 97.8 F (36.6 C) Pulse Rate: 91 Resp: 18 BP: 100/69 Patient Position (if appropriate): Sitting Oxygen Therapy SpO2: 96 % O2 Device: Room Air  Therapy/Group: Individual Therapy  [name] K Nysia Dell 10/21/2018, 10:08 AM

## 2018-10-21 NOTE — IPOC Note (Signed)
Overall Plan of Care [facility]) Patient Details Name: Zachary Spence MRN: [id number] DOB: October 08, 1923  Admitting Diagnosis: <principal problem not specified>  Hospital Problems: Active Problems:   Small vessel disease ([place])   Sequ[place] of lacunar infarction     Functional Problem List: Nursing Endurance, Medication Management, Safety, Skin Integrity  PT Balance, Endurance, Motor, Safety  OT Balance, Endurance, Motor, Safety  SLP    TR         Basic ADL's: OT Grooming, Bathing, Dressing, Toileting     Advanced  ADL's: OT       Transfers: PT Bed Mobility, Bed to Chair, Car, Manufacturing systems engineer, Metallurgist: PT Ambulation, Emergency planning/management officer, Stairs     Additional Impairments: OT    SLP        TR      Anticipated Outcomes Item Anticipated Outcome  Self Feeding n/a  Swallowing      Basic self-care  mod I  Toileting  mod I    Bathroom Transfers mod I toilet and supervision for shower  Bowel/Bladder  Pt will manage bowel and bladder with mod I assist   Transfers  modified independent basic; superivison car  Locomotion  supervision w/c x 150' for activity tolerance, supervision gait x 150' in controlled env with LRAD and LAFO; modified independent gait household x 50' with LRAD and LAFO; supervision up/down 12 steps 2 rails; min assist up/down 1+1 steps no rails with LRAD  Communication     Cognition     Pain  Pt will manage pain at 3 or less on a scale of 0-10.   Safety/Judgment  Pt will follow safety plan with min assist/cues while in rehab.    Therapy Plan: PT Intensity: Minimum of 1-2 x/day ,45 to 90 minutes PT Frequency: 5 out of 7 days PT Duration Estimated Length of Stay: 14 OT Intensity: Minimum of 1-2 x/day, 45 to 90 minutes OT Frequency: 5 out of 7 days OT Duration/Estimated Length of Stay: 10-14 days      Team Interventions: Nursing Interventions Patient/Family Education, Pain Management, Disease Management/Prevention, Skin  Care/Wound Management  PT interventions Ambulation/gait training, Community reintegration, Engineer, drilling, Neuromuscular re-education, Psychosocial support, Stair training, UE/LE Strength taining/ROM, Wheelchair propulsion/positioning, Discharge planning, Training and development officer, Therapeutic Activities, UE/LE Coordination activities, Functional mobility training, Patient/family education, Splinting/orthotics, Therapeutic Exercise, Visual/perceptual remediation/compensation  OT Interventions Training and development officer, DME/adaptive equipment instruction, Patient/family education, Therapeutic Activities, Therapeutic Exercise, Psychosocial support, Community reintegration, Functional mobility training, Self Care/advanced ADL retraining, UE/LE Strength taining/ROM, Discharge planning, UE/LE Coordination activities  SLP Interventions    TR Interventions    SW/CM Interventions Discharge Planning, Psychosocial Support, Patient/Family Education   Barriers to Discharge MD  Medical stability  Nursing      PT      OT Other (comments) none known at this time  SLP      SW       Team Discharge Planning: Destination: PT-Home ,OT- Home , SLP-  Projected Follow-up: PT-Home health PT, OT-  Home health OT, SLP-  Projected Equipment Needs: PT-To be determined, OT- None recommended by OT, SLP-  Equipment Details: PT-owns a SPC, OT-  Patient/family involved in discharge planning: PT- Patient, Family member/caregiver,  OT-Patient, Family member/caregiver, SLP-   MD ELOS: 8-13d Medical Rehab Prognosis:  Excellent Assessment:  84 year old right-handed male history of CAD with CABG in 1997, hypertension, diabetes mellitus, non-Hodgkin's lymphoma and left foot drop. Presented 10/17/2018 with generalized weakness and gait instability read per chart  review, patient, and son, patient lives with son. Independent with a cane and left AFO brace. He still drives. Son works during the day.  One level home with one-step to entry. Cranial CT scan reviewed, unremarkable for acute intracranial process. 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 Patient did not receive TPA. MRI of the brain showed small focus of acute ischemia within the posterior left corona radiata. No hemorrhage or mass-effect. CT angiogram of head and neck with no dissection or aneurysm. Echocardiogram with ejection fraction of 60%. Systolic function was normal. Presently on aspirin and Plavix for CVA prophylaxis. Subcutaneous heparin for DVT prophylaxis. Tolerating a regular diet.    Now requiring 24/7 Rehab RN,MD, as well as CIR level PT, OT and SLP.  Treatment team will focus on ADLs and mobility with goals set at [facility] I See Team Conference Notes for weekly updates to the plan of care

## 2018-10-21 NOTE — Progress Notes (Signed)
Occupational Therapy Session Note  Patient Details  Name: Zachary Spence MRN: [id number] Date of Birth: 1923-09-29  Today's Date: 10/21/2018 OT Individual Time: 1100-1200 OT Individual Time Calculation (min): 60 min    Short Term Goals: Week 1:  OT Short Term Goal 1 (Week 1): Pt will perform shower transfer with min A. OT Short Term Goal 2 (Week 1): Pt will maintain balance with close supervision during LB clothing management.  OT Short Term Goal 3 (Week 1): Pt will perform bathing tasks with supervision and use of AE as needed for safety.   Skilled Therapeutic Interventions/Progress Updates:    Pt resting in recliner upon arrival.  Pt declined bathing and changing clothing this morning.  Pt engaged in dynamic standing tasks in gym (retrieiving horse shoes, tossing, and retrieving from floor with reacher), functional amb in gym while carrying items for placement in container, toilet transfer, and toileting tasks.  Pt requires CGA during ambulation tasks and has tendency to drift to R.  Pt aware of this drift and independently corrects. Pt returned to room and remained in recliner with all needs within reach and chair alarm activated.   Therapy Documentation Precautions:  Precautions Precautions: Fall Required Braces or Orthoses: Other Brace/Splint Other Brace/Splint: Pt wears L AFO at baseline.  Restrictions Weight Bearing Restrictions: No Other Position/Activity Restrictions: HOH   Pain:  Pt denies pain   Therapy/Group: Individual Therapy  Leroy Libman 10/21/2018, 12:08 PM

## 2018-10-22 ENCOUNTER — Inpatient Hospital Stay (HOSPITAL_COMMUNITY): Payer: Medicare Other

## 2018-10-22 ENCOUNTER — Inpatient Hospital Stay (HOSPITAL_COMMUNITY): Payer: Medicare Other | Admitting: Physical Therapy

## 2018-10-22 DIAGNOSIS — K5901 Slow transit constipation: Secondary | ICD-10-CM

## 2018-10-22 DIAGNOSIS — R269 Unspecified abnormalities of gait and mobility: Secondary | ICD-10-CM

## 2018-10-22 DIAGNOSIS — I69398 Other sequelae of cerebral infarction: Secondary | ICD-10-CM

## 2018-10-22 LAB — GLUCOSE, CAPILLARY
GLUCOSE-CAPILLARY: 91 mg/dL (ref 70–99)
GLUCOSE-CAPILLARY: 108 mg/dL — AB (ref 70–99)
Glucose-Capillary: 79 mg/dL (ref 70–99)
Glucose-Capillary: 101 mg/dL — ABNORMAL HIGH (ref 70–99)
Glucose-Capillary: 82 mg/dL (ref 70–99)

## 2018-10-22 MED ORDER — SENNOSIDES-DOCUSATE SODIUM 8.6-50 MG PO TABS
1.0000 | ORAL_TABLET | Freq: Two times a day (BID) | ORAL | Status: DC
  Administered 2018-10-22 – 2018-11-03 (×25): 1 via ORAL
  Filled 2018-10-22 (×26): qty 1

## 2018-10-22 NOTE — Progress Notes (Signed)
Occupational Therapy Session Note  Patient Details  Name: DAVANTA MEUSER MRN: [id number] Date of Birth: 12-22-1923  Today's Date: 10/22/2018 OT Individual Time: 2446-2863 OT Individual Time Calculation (min): 55 min    Short Term Goals: Week 1:  OT Short Term Goal 1 (Week 1): Pt will perform shower transfer with min A. OT Short Term Goal 2 (Week 1): Pt will maintain balance with close supervision during LB clothing management.  OT Short Term Goal 3 (Week 1): Pt will perform bathing tasks with supervision and use of AE as needed for safety.   Skilled Therapeutic Interventions/Progress Updates:    1:1.Pt received seated in recliner very worried about not having BM this date. Pt requesting to alert nurse and ask for suppository. RN alerted and will discuss options after session. Pt ambulates throughout session with RW and intermittent CGA for lateral/posterior LOB. Pt completes standing on compliant surface with RW and min A while completing 2 min dynavision RUE (62 targets locating stimulus on avg 1.98 seconds) and LUE (77 targets locating stimulus in 1.6 seconds on average). Pt demo increased difficulty with second setting of remembering RUE to touch red tagets and LUE touching green targets missing 7 total that disappeared after 3 seconds d/t increased cognitive demand. Pt stands to complete pipe tree activity with min question cues for noticing errors and increased time to complete. Exited session with pt seated in recliner, call light in reach and all needs met  Therapy Documentation Precautions:  Precautions Precautions: Fall Required Braces or Orthoses: Other Brace/Splint Other Brace/Splint: Pt wears L AFO at baseline.  Restrictions Weight Bearing Restrictions: No Other Position/Activity Restrictions: HOH   Therapy/Group: Individual Therapy  Tonny Branch 10/22/2018, 4:24 PM

## 2018-10-22 NOTE — Progress Notes (Signed)
[place] PHYSICAL MEDICINE & REHABILITATION PROGRESS NOTE   Subjective/Complaints:  Amb with PT using SPC, left AFO  Constipation , no abd pain  ROS  Denies CP, SOB, N/V/D   Objective:   No results found. Recent Labs    10/20/18 0612  WBC 9.3  HGB 15.2  HCT 47.3  PLT 299   Recent Labs    10/20/18 0612  NA 140  K 4.0  CL 102  CO2 29  GLUCOSE 132*  BUN 17  CREATININE 1.12  CALCIUM 9.4    Intake/Output Summary (Last 24 hours) at 10/22/2018 0844 Last data filed at 10/22/2018 0813 Gross per 24 hour  Intake 840 ml  Output -  Net 840 ml     Physical Exam: Vital Signs Blood pressure 128/82, pulse 86, temperature 97.9 F (36.6 C), resp. rate 18, height 6' (1.829 m), weight 83.7 kg, SpO2 94 %.   General: No acute distress Mood and affect are appropriate Heart: Regular rate and rhythm no rubs murmurs or extra sounds Lungs: Clear to auscultation, breathing unlabored, no rales or wheezes Abdomen: Positive bowel sounds, soft nontender to palpation, nondistended Extremities: No clubbing, cyanosis, or edema, R hand thenar atrophy Skin: No evidence of breakdown, no evidence of rash Neurologic: Cranial nerves II through XII intact, motor strength is 5/5 in bilateral deltoid, bicep, tricep, grip, hip flexor, knee extensors, ankle dorsiflexor and plantar flexor except 2- Left ankle DF and toe ext 2- ankle PF Sensory exam normal sensation to light touch and proprioception in bilateral upper and lower extremities except Left side toes and dorsum of L foot,  Cerebellar exam normal finger to nose to finger as well as heel to shin in bilateral upper and lower extremities Musculoskeletal: Full range of motion in all 4 extremities. No joint swelling Gait no foot drop with Left AFO, Supervision level with cane  Assessment/Plan: 1. Functional deficits secondary to LEFT corona radiata infarct which require 3+ hours per day of interdisciplinary therapy in a comprehensive inpatient  rehab setting.  Physiatrist is providing close team supervision and 24 hour management of active medical problems listed below.  Physiatrist and rehab team continue to assess barriers to discharge/monitor patient progress toward functional and medical goals  Care Tool:  Bathing    Body parts bathed by patient: Right arm, Left arm, Chest, Abdomen, Front perineal area, Right upper leg, Left upper leg, Face   Body parts bathed by helper: Buttocks, Right lower leg, Left lower leg     Bathing assist Assist Level: Moderate Assistance - Patient 50 - 74%     Upper Body Dressing/Undressing Upper body dressing   What is the patient wearing?: (P) Pull over shirt    Upper body assist Assist Level: Set up assist    Lower Body Dressing/Undressing Lower body dressing      What is the patient wearing?: (P) Pants     Lower body assist Assist for lower body dressing: Contact Guard/Touching assist     Toileting Toileting    Toileting assist Assist for toileting: Set up assist     Transfers Chair/bed transfer  Transfers assist     Chair/bed transfer assist level: Supervision/Verbal cueing     Locomotion Ambulation   Ambulation assist      Assist level: Minimal Assistance - Patient > 75% Assistive device: Other (comment)(none) Max distance: 150'   Walk 10 feet activity   Assist     Assist level: Minimal Assistance - Patient > 75% Assistive device: Walker-rolling  Walk 50 feet activity   Assist    Assist level: Minimal Assistance - Patient > 75% Assistive device: Walker-rolling    Walk 150 feet activity   Assist    Assist level: Minimal Assistance - Patient > 75%      Walk 10 feet on uneven surface  activity   Assist Walk 10 feet on uneven surfaces activity did not occur: Safety/medical concerns(fatigue)         Wheelchair     Assist Will patient use wheelchair at discharge?: No Type of Wheelchair: Manual    Wheelchair assist  level: Supervision/Verbal cueing Max wheelchair distance: 150    Wheelchair 50 feet with 2 turns activity    Assist        Assist Level: Supervision/Verbal cueing   Wheelchair 150 feet activity     Assist Wheelchair 150 feet activity did not occur: Safety/medical concerns(fatigue)   Assist Level: Supervision/Verbal cueing    Medical Problem List and Plan: 1.Decreased functional ability with gait disturbancesecondary toleft corona radiata infarction. Lacunar secondary to small vessel disease -CIR PT, OT, progressing well short LOS anticipated 2. DVT Prophylaxis/Anticoagulation: Subcutaneous heparin. No bleeding episodes 3. Pain Management:Tylenol as needed 4. Mood:Provide emotional support 5. Neuropsych: This patientiscapable of making decisions on hisown behalf. 6. Skin/Wound Care:Routine skin checks 7. Fluids/Electrolytes/Nutrition:Routine in and outs with follow-up chemistries 8.Hypertension. Permissive hypertension. Patient on Tenormin 25 mg daily prior to admission. Resume as needed Controlled on 11/1 Vitals:   10/21/18 1934 10/22/18 0416  BP: 127/66 128/82  Pulse: 75 86  Resp: 16 18  Temp: 98 F (36.7 C) 97.9 F (36.6 C)  SpO2: 98% 94%  9.Diabetes mellitus with peripheral neuropathy. Hemoglobin A1c 6.5. Glucotrol 5 mg daily. Check blood sugars before meals and at bedtime CBG (last 3)  Recent Labs    10/21/18 1207 10/21/18 2204 10/22/18 0638  GLUCAP 89 90 108*  controlled  11/1 10.CAD with CABG 1997. No chest pain or shortness of breath. Continue aspirin 11.History of non-Hodgkin's lymphoma. Follow-up asoutpatient 12.History of left foot drop. Patient wears an AFO brace since 2016, Fairly worn. May need some upkeep.Reviewed neuro notes, "sciatica" or compressive neuropathy I do not see MRI or EMG, no progression over time 13.Hyperlipidemia. Crestor/Lovaza    LOS: 3 days A FACE TO FACE EVALUATION WAS  PERFORMED  Charlett Blake 10/22/2018, 8:44 AM

## 2018-10-22 NOTE — Progress Notes (Signed)
Occupational Therapy Session Note  Patient Details  Name: Zachary Spence MRN: [id number] Date of Birth: [birth year]  Today's Date: 10/22/2018 OT Individual Time: 5277-8242 OT Individual Time Calculation (min): 56 min    Short Term Goals: Week 1:  OT Short Term Goal 1 (Week 1): Pt will perform shower transfer with min A. OT Short Term Goal 2 (Week 1): Pt will maintain balance with close supervision during LB clothing management.  OT Short Term Goal 3 (Week 1): Pt will perform bathing tasks with supervision and use of AE as needed for safety.   Skilled Therapeutic Interventions/Progress Updates:    1:1. Pt with no c/o pain. Pt bathes and dresses at shower level at supervision level. With RW, pt ambulates with CGA for selecting clothing from low dresser drawer. Pt stands at sink with supervision for grooming. Pt requres VC for safety awareness/reach back prior to sitting during functional transfers as well as RW managnetment. Pt transports clothing on RW to  Washer/dryer for simulated IADL loading/unloading and folding in unsupported standing with supervision to challenge balance. Pt stands on compliant surface with up to min A for posterior bias while reaching for horse shoes crossing midline with BUE reaching for horse shoes and placing on basketball hoop. Exites session with pt seated in recliner, cal light in reach and exit alarm on  Therapy Documentation Precautions:  Precautions Precautions: Fall Required Braces or Orthoses: Other Brace/Splint Other Brace/Splint: Pt wears L AFO at baseline.  Restrictions Weight Bearing Restrictions: No Other Position/Activity Restrictions: HOH General: General PT Missed Treatment Reason: Toileting Vital Signs:    Therapy/Group: Individual Therapy  Tonny Branch 10/22/2018, 11:00 AM

## 2018-10-22 NOTE — Progress Notes (Signed)
Physical Therapy Session Note  Patient Details  Name: Zachary Spence MRN: [id number] Date of Birth: 1923/07/24  Today's Date: 10/22/2018 PT Individual Time: 0815-0900 AND 1300-1325 PT Individual Time Calculation (min): 45 min AND 25 min  Short Term Goals: Week 1:  PT Short Term Goal 1 (Week 1): pt will transfer bed>< w/c wiht min assist consistently PT Short Term Goal 2 (Week 1): pt will propel w/c x 150' wiht supervision PT Short Term Goal 3 (Week 1): pt will perform gait iwth LRAD and LAFO x 150' with min assist/close supervision PT Short Term Goal 4 (Week 1): pt will ascend/descend 4 steps 2 rails with min assist  Skilled Therapeutic Interventions/Progress Updates:   Session 1:  Missed 15 min of skilled PT 2/2 toileting at beginning of session. Received on toilet and agreeable to therapy, denies pain. Pt managed LE garments and ambulated to sink w/ CGA. Performed hand hygiene and brushed teeth w/ CGA to close supervision. Provided set-up assist to don socks and shoes at EOB before leaving room. Ambulated around unit w/ SPC, CGA in >150' bouts. Verbal cues for gait pattern and tactile cues for upright posture. Focused on BLE strengthening exercises this session including partial knee bends 2x10 on firm and 2x10 on foam, resisted standing hip abduction w/ orange theraband 2x10 each side, standing knee marches 1x10 on firm and 2x10 on foam, and supine bridge 3x10. Performed NuStep 10 min @ level 5 for LE strengthening w/ tactile and verbal cues for neutral LE alignment. Returned to room and ended session in recliner, all needs in reach.   Session 2: Pt in recliner and agreeable to therapy, denies pain. Ambulated to/from therapy gym w/ CGA w/ SPC. Worked on dynamic standing balance this session w/o UE support. Performed alternating stepping forward, sideways, and backward w/ BLEs and then random. Min assist overall w/ verbal and tactile cues for hip and ankle balance strategies. Performed limits  of stability training on biodex, 60% w/ UE support and 27% w/o UE support. Returned to room and ended session on toilet, all needs in reach. Pt verbalized understanding of calling for and waiting for RN prior to standing - RN made aware and in agreement.   Therapy Documentation Precautions:  Precautions Precautions: Fall Required Braces or Orthoses: Other Brace/Splint Other Brace/Splint: Pt wears L AFO at baseline.  Restrictions Weight Bearing Restrictions: No Other Position/Activity Restrictions: HOH  Therapy/Group: Individual Therapy  Zachary Spence K Zachary Spence 10/22/2018, 10:17 AM

## 2018-10-23 ENCOUNTER — Inpatient Hospital Stay (HOSPITAL_COMMUNITY): Payer: Medicare Other | Admitting: Physical Therapy

## 2018-10-23 ENCOUNTER — Inpatient Hospital Stay (HOSPITAL_COMMUNITY): Payer: Medicare Other

## 2018-10-23 LAB — GLUCOSE, CAPILLARY
GLUCOSE-CAPILLARY: 80 mg/dL (ref 70–99)
Glucose-Capillary: 114 mg/dL — ABNORMAL HIGH (ref 70–99)
Glucose-Capillary: 85 mg/dL (ref 70–99)
Glucose-Capillary: 76 mg/dL (ref 70–99)

## 2018-10-23 NOTE — Progress Notes (Signed)
Occupational Therapy Session Note  Patient Details  Name: CORD WILCZYNSKI MRN: [id number] Date of Birth: [birth year]  Today's Date: 10/23/2018 OT Individual Time: 1530-1640 OT Individual Time Calculation (min): 70 min    Short Term Goals: Week 1:  OT Short Term Goal 1 (Week 1): Pt will perform shower transfer with min A. OT Short Term Goal 2 (Week 1): Pt will maintain balance with close supervision during LB clothing management.  OT Short Term Goal 3 (Week 1): Pt will perform bathing tasks with supervision and use of AE as needed for safety.   Skilled Therapeutic Interventions/Progress Updates:    1:1. Pt mbualtes throughout session with SPC with CGA and VC for looking forward. Pt compeltes 3x10 sit to stand from mat with no UE use (10 reps- B feet on ground, and last 2 sets with 1LE on on 4" step). Pt requries increased steady A with box under LLE forcing weight shift R. Pt completes boxing activity with no AD side stepping around punching bag with mod VC for sequencing of punches (jab, uppercut, cross) and squatting to "duck" for improved dynamic standing balance. Pt makes coffee in ADL apartment with min question cues to orient plug into socket. Pt requires CGA for ambualting around kitchen. OT educates on energy conservation strategies while in kitchen. Pt completes standing cornhole toss crossing midline to reach for bean bags with up to min A for balance. Pt able to pick up bean bags from floor with increased time and CGA. Exited session with pt seated in room, son present supervising (verbalized will call for A to bathroom) and all needs met  Therapy Documentation Precautions:  Precautions Precautions: Fall Required Braces or Orthoses: Other Brace/Splint Other Brace/Splint: Pt wears L AFO at baseline.  Restrictions Weight Bearing Restrictions: No Other Position/Activity Restrictions: HOH General:     Therapy/Group: Individual Therapy  Tonny Branch 10/23/2018, 3:42  PM

## 2018-10-23 NOTE — Progress Notes (Signed)
Physical Therapy Session Note  Patient Details  Name: Zachary Spence MRN: [id number] Date of Birth: Sep 26, 1923  Today's Date: 10/23/2018 PT Individual Time: 1300-1345 PT Individual Time Calculation (min): 45 min   Short Term Goals: Week 1:  PT Short Term Goal 1 (Week 1): pt will transfer bed>< w/c wiht min assist consistently PT Short Term Goal 2 (Week 1): pt will propel w/c x 150' wiht supervision PT Short Term Goal 3 (Week 1): pt will perform gait iwth LRAD and LAFO x 150' with min assist/close supervision PT Short Term Goal 4 (Week 1): pt will ascend/descend 4 steps 2 rails with min assist  Skilled Therapeutic Interventions/Progress Updates:  Pt was seen bedside in the pm. Pt transferred multiple times sit to stand with rolling walker and S. Pt performed multiple stand pivot transfers with c/g and assistive device. Pt ambulated 200 feet with rolling walker and S to c/g. Pt ambulated with SPC and c/g for distances of 150, 200, and 150 feet with verbal cues. In gym focused on NMR, utilizing cone taps and alternating cone taps, 3 sets x 10 reps each. Pt rode Nu-step at level 5 x 10 minutes with 3 rest breaks. Pt returned to room at end of treatment and left sitting up in recliner chair with chair alarm and call bell within reach.   Therapy Documentation Precautions:  Precautions Precautions: Fall Required Braces or Orthoses: Other Brace/Splint Other Brace/Splint: Pt wears L AFO at baseline.  Restrictions Weight Bearing Restrictions: No Other Position/Activity Restrictions: HOH General:   Pain: No c/o pain.    Other Treatments:      Therapy/Group: Individual Therapy  Dub Amis 10/23/2018, 2:29 PM

## 2018-10-23 NOTE — Progress Notes (Signed)
Zachary Spence is a 84 y.o. male 09/04/1923 [id number]  Subjective: No new complaints. No new problems. Slept well. Feeling OK.  Objective: Vital signs in last 24 hours: Temp:  [97.9 F (36.6 C)-98.7 F (37.1 C)] 98 F (36.7 C) (11/02 0502) Pulse Rate:  [84-90] 90 (11/02 0502) Resp:  [16-18] 18 (11/02 0502) BP: (113-123)/(69-79) 120/79 (11/02 0502) SpO2:  [96 %-97 %] 96 % (11/02 0502) Weight change:  Last BM Date: 10/22/18  Intake/Output from previous day: 11/01 0701 - 11/02 0700 In: 840 [P.O.:840] Out: -  Last cbgs: CBG (last 3)  Recent Labs    10/22/18 2142 10/23/18 0638 10/23/18 1126  GLUCAP 82 114* 85     Physical Exam General: No apparent distress   HEENT: not dry Lungs: Normal effort. Lungs clear to auscultation, no crackles or wheezes. Cardiovascular: Regular rate and rhythm, no edema Abdomen: S/NT/ND; BS(+) Musculoskeletal:  unchanged Neurological: No new neurological deficits Wounds: N/A    Skin: clear  Aging changes Mental state: Alert, oriented, cooperative    Lab Results: BMET    Component Value Date/Time   NA 140 10/20/2018 0612   NA 138 04/18/2015 1333   K 4.0 10/20/2018 0612   K 4.0 04/18/2015 1333   CL 102 10/20/2018 0612   CL 106 04/18/2015 1333   CO2 29 10/20/2018 0612   CO2 27 04/18/2015 1333   GLUCOSE 132 (H) 10/20/2018 0612   GLUCOSE 93 04/18/2015 1333   BUN 17 10/20/2018 0612   BUN 20 04/18/2015 1333   CREATININE 1.12 10/20/2018 0612   CREATININE 1.05 04/18/2015 1333   CALCIUM 9.4 10/20/2018 0612   CALCIUM 8.8 (L) 04/18/2015 1333   GFRNONAA 55 (L) 10/20/2018 0612   GFRNONAA >60 04/18/2015 1333   GFRAA >60 10/20/2018 0612   GFRAA >60 04/18/2015 1333   CBC    Component Value Date/Time   WBC 9.3 10/20/2018 0612   RBC 4.97 10/20/2018 0612   HGB 15.2 10/20/2018 0612   HGB 14.5 04/18/2015 1333   HCT 47.3 10/20/2018 0612   HCT 43.9 04/18/2015 1333   PLT 299 10/20/2018 0612   PLT 275 04/18/2015 1333   MCV 95.2  10/20/2018 0612   MCV 94 04/18/2015 1333   MCH 30.6 10/20/2018 0612   MCHC 32.1 10/20/2018 0612   RDW 14.0 10/20/2018 0612   RDW 14.5 04/18/2015 1333   LYMPHSABS 3.6 10/20/2018 0612   LYMPHSABS 2.6 04/18/2015 1333   MONOABS 1.1 (H) 10/20/2018 0612   MONOABS 0.8 04/18/2015 1333   EOSABS 0.3 10/20/2018 0612   EOSABS 0.1 04/18/2015 1333   BASOSABS 0.1 10/20/2018 0612   BASOSABS 0.1 04/18/2015 1333    Studies/Results: No results found.  Medications: I have reviewed the patient's current medications.  Assessment/Plan:   1.  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  Unsteady gait.  Continue with CIR including PT, OT,. 2.  DVT prophylaxis with subcutaneous heparin 3.  Pain management with PRN Tylenol 4.  Hypertension.  Tenormin 25 mg prior to admission.  Resume if needed 5.  Coronary disease.  Status post bypass in 1997.  Continue aspirin 6.  History of non-Hodgkin's lymphoma.  Outpatient follow-up 7.  History of left foot drop.  AFO brace. 8.  Hyperlipidemia.  On Crestor and Lovaza    Length of stay, days: 4  Walker Kehr , MD 10/23/2018, 11:57 AM

## 2018-10-23 NOTE — Progress Notes (Signed)
Physical Therapy Session Note  Patient Details  Name: Zachary Spence MRN: [id number] Date of Birth: 1923/10/13  Today's Date: 10/23/2018 PT Individual Time: 0915-0955 AND 1415-1540 PT Individual Time Calculation (min): 40 min AND 25 min  Short Term Goals: Week 1:  PT Short Term Goal 1 (Week 1): pt will transfer bed>< w/c wiht min assist consistently PT Short Term Goal 2 (Week 1): pt will propel w/c x 150' wiht supervision PT Short Term Goal 3 (Week 1): pt will perform gait iwth LRAD and LAFO x 150' with min assist/close supervision PT Short Term Goal 4 (Week 1): pt will ascend/descend 4 steps 2 rails with min assist  Skilled Therapeutic Interventions/Progress Updates:   Session 1:  Pt received on toilet and agreeable to therapy, denies pain. Supervision to finish w/ toileting and manage LE garments. Supervision to wash hands at sink prior to leaving room. Ambulated to/from therapy gym w/ CGA using SPC. Worked on standing balance and balance strategies this session. Performed UE reaching tasks in standing w/o UE support, verbal and min tactile cues for anterior weight shifting to correct posterior LOB. Performed multiple sit<>stands w/o UE assist w/ verbal and visual cues for technique and anterior weight shifting to stabilize in stance. Worked on gastroc and hamstring stretch during static stance on inclined foam, min-mod assist fading to CGA w/ 60+ sec of holding. Added in dynamic reaching activity towards end of each bout. Verbal and tactile cues for anterior weight shifting throughout. Returned to room and ended session in recliner and all needs in reach.   Session 2:  Pt in recliner and agreeable to therapy, denies pain. Ambulated to/from therapy gym w/ min assist using SPC. Performed Berg Balance Scale, scored 27/56,  and explained significance of results to patient including high fall risk and continued need for support from AD at all times. Pt discouraged by this, but verbalized  understanding. Encouraged pt to keep working hard in therapies and that he will also continue to improve once he leaves CIR. Returned to room and ended session in recliner, all needs in reach.   Therapy Documentation Precautions:  Precautions Precautions: Fall Required Braces or Orthoses: Other Brace/Splint Other Brace/Splint: Pt wears L AFO at baseline.  Restrictions Weight Bearing Restrictions: No Other Position/Activity Restrictions: HOH  Therapy/Group: Individual Therapy  Janeisha Ryle Clent Demark 10/23/2018, 9:57 AM

## 2018-10-24 ENCOUNTER — Inpatient Hospital Stay (HOSPITAL_COMMUNITY): Payer: Medicare Other | Admitting: Occupational Therapy

## 2018-10-24 LAB — GLUCOSE, CAPILLARY
GLUCOSE-CAPILLARY: 112 mg/dL — AB (ref 70–99)
GLUCOSE-CAPILLARY: 111 mg/dL — AB (ref 70–99)
Glucose-Capillary: 58 mg/dL — ABNORMAL LOW (ref 70–99)
Glucose-Capillary: 99 mg/dL (ref 70–99)
Glucose-Capillary: 89 mg/dL (ref 70–99)

## 2018-10-24 NOTE — Progress Notes (Signed)
Occupational Therapy Session Note  Patient Details  Name: Zachary Spence MRN: [id number] Date of Birth: 1923-01-21  Today's Date: 10/24/2018 OT Individual Time:  [phone] (55 mins)      Short Term Goals: Week 1:  OT Short Term Goal 1 (Week 1): Pt will perform shower transfer with min A. OT Short Term Goal 2 (Week 1): Pt will maintain balance with close supervision during LB clothing management.  OT Short Term Goal 3 (Week 1): Pt will perform bathing tasks with supervision and use of AE as needed for safety.   Skilled Therapeutic Interventions/Progress Updates:    Pt sitting in recliner upon entry eager to begin therapy session and no reports of pain. Pt performed functional transfers and ambulation with SPC and close supervision-CGA throughout. Pt ambulated to bathroom and transferred to TTB in shower- pt performed all aspects of bathing with close supervision using lateral leans for buttocks for decreased fall risk. Pt then ambulated to EOB and performed dressing tasks. In standing, pt req CGA for pulling underwear/pants up due to decreased UE support with SPC. Pt then ambulated to sink and performed grooming tasks while standing.  No rest break req with pt then ambulating with SPC to ADL apartment for kitchen task with focus on home mobility with SPC, safety in the kitchen, and dynamic standing balance reaching high and low. Pt retrieved pans and dishes from high/low cabinets with CGA req throughout and min VC's for using SPC when ambulating throughout kitchen. During task, pt educated on energy conservation techniques as well as placing most commonly used items at waist level or above for decreased fall risk. Pt then ambulated to low seated recliner and performed 2 sets x5 reps of sit to stand initiations with [facility]- close supervision provided with min VC's for reaching back prior to sitting and controlled slow decent into chair in prep for for functional transfers at home.  Pt then ambulated  to therapy gym and performed dynamic standing balance task standing on balance foam with SPC reaching laterally, behind and high placing moderate resistance clips and horseshoes on basketball hoop. Pt req CGA-min A throughout for balance- pt able to self correct LOB with min A during task. Pt then ambulated back to room as mentioned above and left seated in w/c with all needs in reach and chair alarm set.  Therapy Documentation Precautions:  Precautions Precautions: Fall Required Braces or Orthoses: Other Brace/Splint Other Brace/Splint: Pt wears L AFO at baseline.  Restrictions Weight Bearing Restrictions: No Other Position/Activity Restrictions: HOH ADL: ADL Grooming: Contact guard Where Assessed-Grooming: Standing at sink Upper Body Bathing: Supervision/safety Where Assessed-Upper Body Bathing: Shower Lower Body Bathing: Supervision/safety Where Assessed-Lower Body Bathing: Shower Upper Body Dressing: Setup Where Assessed-Upper Body Dressing: Edge of bed Lower Body Dressing: Contact guard Where Assessed-Lower Body Dressing: Edge of bed Nutritional therapist Method: [employer] manager: Transfer tub bench   Therapy/Group: Individual Therapy  Sharlett Lienemann 10/24/2018, 9:36 AM

## 2018-10-24 NOTE — Significant Event (Signed)
Hypoglycemic Event  CBG: 58  Treatment: 15 GM carbohydrate snack  Symptoms: None  Follow-up CBG: Time:1225 CBG Result:99  Possible Reasons for Event: Unknown  Comments/MD notified:  Pt given juice and ate meal. No symptoms of hypoglycemia present.     Zachary Spence S

## 2018-10-24 NOTE — Progress Notes (Signed)
Zachary Spence is a 84 y.o. male 1923-05-11 [id number]  Subjective: No new complaints. No new problems. Slept well. Feeling OK.  Objective: Vital signs in last 24 hours: Temp:  [97.8 F (36.6 C)-98.5 F (36.9 C)] 98.1 F (36.7 C) (11/03 0428) Pulse Rate:  [75-85] 83 (11/03 0428) Resp:  [18-20] 18 (11/03 0428) BP: (108-133)/(69-78) 114/78 (11/03 0428) SpO2:  [94 %-97 %] 97 % (11/03 0428) Weight change:  Last BM Date: 10/23/18  Intake/Output from previous day: 11/02 0701 - 11/03 0700 In: 86 [P.O.:460] Out: -  Last cbgs: CBG (last 3)  Recent Labs    10/23/18 2121 10/24/18 0642 10/24/18 1146  GLUCAP 80 112* 58*     Physical Exam General: No apparent distress.  Eating breakfast HEENT: not dry Lungs: Normal effort. Lungs clear to auscultation, no crackles or wheezes. Cardiovascular: Regular rate and rhythm, no edema Abdomen: S/NT/ND; BS(+) Musculoskeletal:  unchanged Neurological: No new neurological deficits Wounds: N/A    Skin: clear  Aging changes Mental state: Alert, oriented, cooperative    Lab Results: BMET    Component Value Date/Time   NA 140 10/20/2018 0612   NA 138 04/18/2015 1333   K 4.0 10/20/2018 0612   K 4.0 04/18/2015 1333   CL 102 10/20/2018 0612   CL 106 04/18/2015 1333   CO2 29 10/20/2018 0612   CO2 27 04/18/2015 1333   GLUCOSE 132 (H) 10/20/2018 0612   GLUCOSE 93 04/18/2015 1333   BUN 17 10/20/2018 0612   BUN 20 04/18/2015 1333   CREATININE 1.12 10/20/2018 0612   CREATININE 1.05 04/18/2015 1333   CALCIUM 9.4 10/20/2018 0612   CALCIUM 8.8 (L) 04/18/2015 1333   GFRNONAA 55 (L) 10/20/2018 0612   GFRNONAA >60 04/18/2015 1333   GFRAA >60 10/20/2018 0612   GFRAA >60 04/18/2015 1333   CBC    Component Value Date/Time   WBC 9.3 10/20/2018 0612   RBC 4.97 10/20/2018 0612   HGB 15.2 10/20/2018 0612   HGB 14.5 04/18/2015 1333   HCT 47.3 10/20/2018 0612   HCT 43.9 04/18/2015 1333   PLT 299 10/20/2018 0612   PLT 275 04/18/2015 1333    MCV 95.2 10/20/2018 0612   MCV 94 04/18/2015 1333   MCH 30.6 10/20/2018 0612   MCHC 32.1 10/20/2018 0612   RDW 14.0 10/20/2018 0612   RDW 14.5 04/18/2015 1333   LYMPHSABS 3.6 10/20/2018 0612   LYMPHSABS 2.6 04/18/2015 1333   MONOABS 1.1 (H) 10/20/2018 0612   MONOABS 0.8 04/18/2015 1333   EOSABS 0.3 10/20/2018 0612   EOSABS 0.1 04/18/2015 1333   BASOSABS 0.1 10/20/2018 0612   BASOSABS 0.1 04/18/2015 1333    Studies/Results: No results found.  Medications: I have reviewed the patient's current medications.  Assessment/Plan:   1.  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  Unsteady gait.  Continue with CIR including PT, OT. 2.  DVT prophylaxis with subcutaneous heparin 3.  Pain management with PRN Tylenol 4.  Hypertension.  Tenormin 25 mg prior to admission-will resume if needed 5.  Coronary disease.  Status post bypass in 1997.  Continue aspirin daily 6.  History of non-Hodgkin's lymphoma.  Outpatient follow-up with oncology 7.  History of left foot drop.  AFO brace 8.  Hyperlipidemia.  On Crestor and Lovaza    Length of stay, days: 5  Walker Kehr , MD 10/24/2018, 12:28 PM

## 2018-10-25 ENCOUNTER — Inpatient Hospital Stay (HOSPITAL_COMMUNITY): Payer: Medicare Other | Admitting: Occupational Therapy

## 2018-10-25 ENCOUNTER — Inpatient Hospital Stay (HOSPITAL_COMMUNITY): Payer: Medicare Other

## 2018-10-25 LAB — GLUCOSE, CAPILLARY
GLUCOSE-CAPILLARY: 115 mg/dL — AB (ref 70–99)
Glucose-Capillary: 112 mg/dL — ABNORMAL HIGH (ref 70–99)
Glucose-Capillary: 101 mg/dL — ABNORMAL HIGH (ref 70–99)
Glucose-Capillary: 78 mg/dL (ref 70–99)

## 2018-10-25 NOTE — Progress Notes (Signed)
Occupational Therapy Session Note  Patient Details  Name: Zachary Spence MRN: [id number] Date of Birth: 1923-12-03  Today's Date: 10/25/2018 OT Individual Time: 0900-1002 OT Individual Time Calculation (min): 62 min    Short Term Goals: Week 1:  OT Short Term Goal 1 (Week 1): Pt will perform shower transfer with min A. OT Short Term Goal 2 (Week 1): Pt will maintain balance with close supervision during LB clothing management.  OT Short Term Goal 3 (Week 1): Pt will perform bathing tasks with supervision and use of AE as needed for safety.   Skilled Therapeutic Interventions/Progress Updates:    Patient seated in recliner at start of session. He is pleasant, cooperative and happy to participate in therapy session.  Patient denies pain.  Functional transfers:  SPT to/from recliner, toilet, therapy mat CG/CS with SPC and occ cues for safety.  Functional ambulation:  Patient ambulated to and from therapy gym and throughout therapy gym during session using SPC with CG/CS and cues for balance.  Toileting/hygiene with CS and grab bar.  Functional activities to includes both seated and standing core/trunk and balance activities utilizing 3# hand weights and 4# weighted ball  - S for seated activity, min A in stance (occ mod A to regain balance with standing activity that required side to side or turning motions).  Reviewed daily routine, safety with ambulation, awareness of difficulty with turning, reach and transport of items while walking with a SPC.  Recommended using travel mug or thermos for liquids.  Patient demonstrates good recall of information.  Patient returned to recliner with alarm activated.    Therapy Documentation Precautions:  Precautions Precautions: Fall Required Braces or Orthoses: Other Brace/Splint Other Brace/Splint: Pt wears L AFO at baseline.  Restrictions Weight Bearing Restrictions: No Other Position/Activity Restrictions: HOH General:   Vital Signs:   Pain: Pain  Assessment Pain Scale: 0-10 Pain Score: 0-No pain   Therapy/Group: Individual Therapy  Carlos Levering 10/25/2018, 2:26 PM

## 2018-10-25 NOTE — Progress Notes (Signed)
Physical Therapy Session Note  Patient Details  Name: Zachary Spence MRN: [id number] Date of Birth: 1923-03-26  Today's Date: 10/25/2018 PT Individual Time: 0800-0900 and 1300-1400 PT Individual Time Calculation (min): 60 min and 60 min    Short Term Goals: Week 1:  PT Short Term Goal 1 (Week 1): pt will transfer bed>< w/c wiht min assist consistently PT Short Term Goal 2 (Week 1): pt will propel w/c x 150' wiht supervision PT Short Term Goal 3 (Week 1): pt will perform gait iwth LRAD and LAFO x 150' with min assist/close supervision PT Short Term Goal 4 (Week 1): pt will ascend/descend 4 steps 2 rails with min assist  Skilled Therapeutic Interventions/Progress Updates:    Session 1:  Pt standing at sink with NT upon PT arrival, agreeable to therapy tx and denies pain. Pt ambulated from room>gym x 150 ft with SPC and CGA. Pt worked on dynamic standing balance throughout session without UE support overall min-CGA but occasional max assist to recover from posterior LOB- toe taps to target on 4 inch step, standing on airex tossing ball, tandem stance, feet together stance, ambulation without AD, forwards/backwards walking without AD, ambulation with eyes closes, standing on red wedge, standing on airex with eyes closed. Therapist educated pt on importance on adding stretching to home exercise program, pt performed 2 x 60 sec hamstring stretch bilaterally this session. PT worked on dynamic standing balance while ambulating with SPC >300 ft working on changes in gait speed, sudden stops and visually scanning environment to name objects in hallway. Pt ambulated back to room and left seated in recliner with needs in reach and chair alarm set.   Session 2: Pt seated in recliner upon PT arrival, agreeable to therapy tx and denies pain. Pt ambulated from room>gym x 150 ft with CGA and SPC, x 1 LOB with mod assist to correct. Pt performed floor transfer this session with min assist, verbal cues for  techniques. Pt in long sitting for hamstring stretch 2 x 30 sec. Pt performed 2 x 10 bridges in supine for hip strengthening, pt transferred to sidelying and performed x 10 hip extension for strengthening. Therapist performed manual hip flexor stretching 2 x 30 sec bilaterally. Pt transferred supine>prone with supervision for hip flexor stretching. Pt transferred to sitting edge of mat with supervision. Pt performed x 10 mini squats without UE support for hip strengthening. Pt standing on red wedge for heel cord stretching, posterior LOB. Pt worked on dynamic standing balance this session without UE support to perform sidestepping in each direction, feet together standing with manual perturbations, feet together eyes closed, standing eyes closed with manual perturbations, all with CGA-mod assist for correction of LOB. Pt ambulated back to room with SPC and CGA, left seated in recliner with needs in reach and chair alarm set.   Therapy Documentation Precautions:  Precautions Precautions: Fall Required Braces or Orthoses: Other Brace/Splint Other Brace/Splint: Pt wears L AFO at baseline.  Restrictions Weight Bearing Restrictions: No Other Position/Activity Restrictions: HOH    Therapy/Group: Individual Therapy  Netta Corrigan, PT, DPT 10/25/2018, 7:51 AM

## 2018-10-25 NOTE — Progress Notes (Signed)
[facility] PHYSICAL MEDICINE & REHABILITATION PROGRESS NOTE   Subjective/Complaints:  No new issues overntie  ROS  Denies CP, SOB, N/V/D   Objective:   No results found. No results for input(s): WBC, HGB, HCT, PLT in the last 72 hours. No results for input(s): NA, K, CL, CO2, GLUCOSE, BUN, CREATININE, CALCIUM in the last 72 hours.  Intake/Output Summary (Last 24 hours) at 10/25/2018 0846 Last data filed at 10/25/2018 0700 Gross per 24 hour  Intake 700 ml  Output -  Net 700 ml     Physical Exam: Vital Signs Blood pressure 129/81, pulse 79, temperature 97.6 F (36.4 C), resp. rate 17, height 6' (1.829 m), weight 83.7 kg, SpO2 98 %.   General: No acute distress Mood and affect are appropriate Heart: Regular rate and rhythm no rubs murmurs or extra sounds Lungs: Clear to auscultation, breathing unlabored, no rales or wheezes Abdomen: Positive bowel sounds, soft nontender to palpation, nondistended Extremities: No clubbing, cyanosis, or edema, R hand thenar atrophy Skin: No evidence of breakdown, no evidence of rash Neurologic: Cranial nerves II through XII intact, motor strength is 5/5 in bilateral deltoid, bicep, tricep, grip, hip flexor, knee extensors, ankle dorsiflexor and plantar flexor except 2- Left ankle DF and toe ext 2- ankle PF Sensory exam normal sensation to light touch and proprioception in bilateral upper and lower extremities except Left side toes and dorsum of L foot,  Cerebellar exam normal finger to nose to finger as well as heel to shin in bilateral upper and lower extremities Musculoskeletal: Full range of motion in all 4 extremities. No joint swelling Gait no foot drop with Left AFO, Supervision level with cane  Assessment/Plan: 1. Functional deficits secondary to LEFT corona radiata infarct which require 3+ hours per day of interdisciplinary therapy in a comprehensive inpatient rehab setting.  Physiatrist is providing close team supervision and 24 hour  management of active medical problems listed below.  Physiatrist and rehab team continue to assess barriers to discharge/monitor patient progress toward functional and medical goals  Care Tool:  Bathing    Body parts bathed by patient: Right arm, Left arm, Chest, Abdomen, Front perineal area, Right upper leg, Left upper leg, Face, Buttocks, Left lower leg, Right lower leg   Body parts bathed by helper: Buttocks, Right lower leg, Left lower leg     Bathing assist Assist Level: Supervision/Verbal cueing     Upper Body Dressing/Undressing Upper body dressing   What is the patient wearing?: Pull over shirt    Upper body assist Assist Level: Set up assist    Lower Body Dressing/Undressing Lower body dressing      What is the patient wearing?: Pants, Underwear/pull up     Lower body assist Assist for lower body dressing: Contact Guard/Touching assist     Toileting Toileting    Toileting assist Assist for toileting: Contact Guard/Touching assist     Transfers Chair/bed transfer  Transfers assist     Chair/bed transfer assist level: Contact Guard/Touching assist     Locomotion Ambulation   Ambulation assist      Assist level: Contact Guard/Touching assist Assistive device: Cane-straight Max distance: 200   Walk 10 feet activity   Assist     Assist level: Contact Guard/Touching assist Assistive device: Cane-straight   Walk 50 feet activity   Assist    Assist level: Contact Guard/Touching assist Assistive device: Cane-straight    Walk 150 feet activity   Assist    Assist level: Contact Guard/Touching assist  Assistive device: Cane-straight    Walk 10 feet on uneven surface  activity   Assist Walk 10 feet on uneven surfaces activity did not occur: Safety/medical concerns(fatigue)         Wheelchair     Assist Will patient use wheelchair at discharge?: No Type of Wheelchair: Manual    Wheelchair assist level:  Supervision/Verbal cueing Max wheelchair distance: 150    Wheelchair 50 feet with 2 turns activity    Assist        Assist Level: Supervision/Verbal cueing   Wheelchair 150 feet activity     Assist Wheelchair 150 feet activity did not occur: Safety/medical concerns(fatigue)   Assist Level: Supervision/Verbal cueing    Medical Problem List and Plan: 1.Decreased functional ability with gait disturbancesecondary toleft corona radiata infarction. Lacunar secondary to small vessel disease -CIR PT, OT,  2. DVT Prophylaxis/Anticoagulation: Subcutaneous heparin. No bleeding episodes 3. Pain Management:Tylenol as needed 4. Mood:Provide emotional support 5. Neuropsych: This patientiscapable of making decisions on hisown behalf. 6. Skin/Wound Care:Routine skin checks 7. Fluids/Electrolytes/Nutrition:Routine in and outs with follow-up chemistries 8.Hypertension. Permissive hypertension. Patient on Tenormin 25 mg daily prior to admission. Resume as needed Controlled on 11/4 Vitals:   10/24/18 1922 10/25/18 0444  BP: 121/71 129/81  Pulse: 80 79  Resp: 19 17  Temp: 97.8 F (36.6 C) 97.6 F (36.4 C)  SpO2: 96% 98%  9.Diabetes mellitus with peripheral neuropathy. Hemoglobin A1c 6.5. Glucotrol 5 mg daily. Check blood sugars before meals and at bedtime CBG (last 3)  Recent Labs    10/24/18 1631 10/24/18 2133 10/25/18 0634  GLUCAP 89 111* 115*  controlled  11/4 10.CAD with CABG 1997. No chest pain or shortness of breath. Continue aspirin 11.History of non-Hodgkin's lymphoma. Follow-up asoutpatient 12.History of left foot drop. Patient wears an AFO brace since 2016, Fairly worn. May need some upkeep.Reviewed neuro notes, "sciatica" or compressive neuropathy I do not see MRI or EMG, no progression over time 13.Hyperlipidemia. Crestor/Lovaza    LOS: 6 days A FACE TO FACE EVALUATION WAS PERFORMED  Charlett Blake 10/25/2018, 8:46 AM

## 2018-10-26 ENCOUNTER — Inpatient Hospital Stay (HOSPITAL_COMMUNITY): Payer: Medicare Other | Admitting: Occupational Therapy

## 2018-10-26 ENCOUNTER — Inpatient Hospital Stay (HOSPITAL_COMMUNITY): Payer: Self-pay

## 2018-10-26 ENCOUNTER — Inpatient Hospital Stay (HOSPITAL_COMMUNITY): Payer: Self-pay | Admitting: Physical Therapy

## 2018-10-26 LAB — GLUCOSE, CAPILLARY
GLUCOSE-CAPILLARY: 107 mg/dL — AB (ref 70–99)
GLUCOSE-CAPILLARY: 134 mg/dL — AB (ref 70–99)
Glucose-Capillary: 72 mg/dL (ref 70–99)

## 2018-10-26 NOTE — Progress Notes (Signed)
Physical Therapy Session Note  Patient Details  Name: Zachary Spence MRN: [id number] Date of Birth: [birth year]  Today's Date: 10/26/2018 PT Individual Time: 9767-3419 PT Individual Time Calculation (min): 70 min   Short Term Goals: Week 1:  PT Short Term Goal 1 (Week 1): pt will transfer bed>< w/c wiht min assist consistently PT Short Term Goal 2 (Week 1): pt will propel w/c x 150' wiht supervision PT Short Term Goal 3 (Week 1): pt will perform gait iwth LRAD and LAFO x 150' with min assist/close supervision PT Short Term Goal 4 (Week 1): pt will ascend/descend 4 steps 2 rails with min assist  Skilled Therapeutic Interventions/Progress Updates:    Pt seated in recliner upon PT arrival, agreeable to therapy tx and denies pain. Pt ambulated from room>gym with SPC and CGA for balance. Pt worked on Dietitian without AD in order to worked on ambulation forwards x 40 ft, ambulation forwards with eyes closed 3 x 40 ft and backwards walking 2 x 40 ft, all with min assist. Pt worked on dynamic standing balance and heel cord stretching while standing on blue wedge and tossing horseshoes, min assist, x 2 trials. Pt worked on static standing balance with eyes closed and eyes closed on airex, posterior lean noted. Pt standing on level surface with eyes closed while therapist applied manual perturbations working on balance reaction, LOB posteriorly. Pt transferred from sitting>supine with supervision. Therapist performed manual hip flexor and hamstring stretching this session for ROM. Pt performed x 10 bridges in hooklying, x 10 bridges with feet on phyioball, x 10 single leg bridges with LE on physioball, all for hip strengthening and neuro re-ed. Pt worked on dynamic standing balance to perform side stepping within agility ladder, forwards walking within agility ladder, marching on kinetron without UE support and gait with changes in speed/head turns/suddent stops, all with CGA-min assist. Pt ambulated  back to room and left seated in recliner with chair alarm set and needs in reach.   Therapy Documentation Precautions:  Precautions Precautions: Fall Required Braces or Orthoses: Other Brace/Splint Other Brace/Splint: Pt wears L AFO at baseline.  Restrictions Weight Bearing Restrictions: No Other Position/Activity Restrictions: HOH    Therapy/Group: Individual Therapy  Netta Corrigan, PT, DPT 10/26/2018, 8:00 AM

## 2018-10-26 NOTE — Progress Notes (Signed)
Occupational Therapy Session Note  Patient Details  Name: Zachary Spence MRN: [id number] Date of Birth: 1923/11/09  Today's Date: 10/26/2018 OT Individual Time: 1115-1200 OT Individual Time Calculation (min): 45 min    Short Term Goals: Week 1:  OT Short Term Goal 1 (Week 1): Pt will perform shower transfer with min A. OT Short Term Goal 2 (Week 1): Pt will maintain balance with close supervision during LB clothing management.  OT Short Term Goal 3 (Week 1): Pt will perform bathing tasks with supervision and use of AE as needed for safety.   Skilled Therapeutic Interventions/Progress Updates:    Pt received in recliner dressed for the day. He declined a shower today.  Pt eager to work on his balance. Pt used SPC to ambulate to gym with CGA.  In gym pt worked on sit to stand from elevated mat with no UE support, then holding bar, then holding large ball all with close S.   Pt practiced picking up a box, placing it on the floor, picking it up 2x with CGA.  Discussed safe ways of reaching for items on floor by using one hand on sturdy surface for support.   Pt worked on dynamic reach in sitting to facilitate improved weight shift with hip lift and trunk elongation.   Pt ambulated back to room with min A 40% of the time as he had a few small LOB to his R.  Once in room, pt toileted with distant S then walked to sink with S to wash hands and then to recliner.  Set up in chair with chair alarm and lunch tray.  All needs met.  Therapy Documentation Precautions:  Precautions Precautions: Fall Required Braces or Orthoses: Other Brace/Splint Other Brace/Splint: Pt wears L AFO at baseline.  Restrictions Weight Bearing Restrictions: No Other Position/Activity Restrictions: HOH       Pain: Pain Assessment Pain Score: 0-No pain    Therapy/Group: Individual Therapy  Faren Florence 10/26/2018, 12:57 PM

## 2018-10-26 NOTE — Progress Notes (Addendum)
Physical Therapy Session Note  Patient Details  Name: Zachary Spence MRN: [id number] Date of Birth: 1923/10/17  Today's Date: 10/26/2018 PT Individual Time: 0102-7253 PT Individual Time Calculation (min): 73 min   Short Term Goals: Week 1:  PT Short Term Goal 1 (Week 1): pt will transfer bed>< w/c wiht min assist consistently PT Short Term Goal 2 (Week 1): pt will propel w/c x 150' wiht supervision PT Short Term Goal 3 (Week 1): pt will perform gait iwth LRAD and LAFO x 150' with min assist/close supervision PT Short Term Goal 4 (Week 1): pt will ascend/descend 4 steps 2 rails with min assist  Skilled Therapeutic Interventions/Progress Updates:   Pt in recliner and agreeable to therapy, denies pain. Ambulated to/from therapy gym w/ CGA-min assist using SPC. Session focused on balance strategies. Worked on bilateral gastroc stretching while on red foam wedge w/ min assist fading to CGA while intermittently performing UE reaching activities. Stood 2-3 min at a time on wedge before needing seated rest break. Reaching tasks included card matching and reaching for clothespins. Needed mod-max assist to stabilize when performed sit>stand on foam, also performed blocked practice of this to work on ankle balance strategies. Verbal and tactile cues for technique. Stood 2-3 min at a time on airex pad while performing bimanual tasks to work on ankle balance strategies, CGA to min assist overall. Performed side stepping and backwards walking w/o SPC w/ min assist for balance to work on stepping balance strategies.Added orange theraband for resistance w/ side-stepping. Demonstrated how to perform resisted abduction w/ green theraband while seated in recliner in between therapy sessions to work on hip abductor activation. Additionally demonstrated towel gastroc stretch and provided visual/written handouts of both. Seated rest breaks intermittently throughout session 2/2 fatigue and decreased balance w/ fatigue.  Returned to room and ended session in recliner, all needs in reach.   Therapy Documentation Precautions:  Precautions Precautions: Fall Required Braces or Orthoses: Other Brace/Splint Other Brace/Splint: Pt wears L AFO at baseline.  Restrictions Weight Bearing Restrictions: No Other Position/Activity Restrictions: HOH  Therapy/Group: Individual Therapy  Emojean Gertz K Merelin Human 10/26/2018, 11:15 AM

## 2018-10-26 NOTE — Progress Notes (Signed)
[facility] PHYSICAL MEDICINE & REHABILITATION PROGRESS NOTE   Subjective/Complaints:  Had BM this am, first one in 2 d CBG down to 69 yest asymptomatic  ROS  Denies CP, SOB, N/V/D   Objective:   No results found. No results for input(s): WBC, HGB, HCT, PLT in the last 72 hours. No results for input(s): NA, K, CL, CO2, GLUCOSE, BUN, CREATININE, CALCIUM in the last 72 hours.  Intake/Output Summary (Last 24 hours) at 10/26/2018 0841 Last data filed at 10/25/2018 1800 Gross per 24 hour  Intake 600 ml  Output -  Net 600 ml     Physical Exam: Vital Signs Blood pressure 132/77, pulse 81, temperature 98.6 F (37 C), temperature source Oral, resp. rate 18, height 6' (1.829 m), weight 83.7 kg, SpO2 96 %.   General: No acute distress Mood and affect are appropriate Heart: Regular rate and rhythm no rubs murmurs or extra sounds Lungs: Clear to auscultation, breathing unlabored, no rales or wheezes Abdomen: Positive bowel sounds, soft nontender to palpation, nondistended Extremities: No clubbing, cyanosis, or edema, R hand thenar atrophy Skin: No evidence of breakdown, no evidence of rash Neurologic: Cranial nerves II through XII intact, motor strength is 5/5 in bilateral deltoid, bicep, tricep, grip, hip flexor, knee extensors, ankle dorsiflexor and plantar flexor except 2- Left ankle DF and toe ext 2- ankle PF Sensory exam normal sensation to light touch and proprioception in bilateral upper and lower extremities except Left side toes and dorsum of L foot,  Cerebellar exam normal finger to nose to finger as well as heel to shin in bilateral upper and lower extremities Musculoskeletal: Full range of motion in all 4 extremities. No joint swelling Gait no foot drop with Left AFO, Supervision level with cane  Assessment/Plan: 1. Functional deficits secondary to LEFT corona radiata infarct which require 3+ hours per day of interdisciplinary therapy in a comprehensive inpatient rehab  setting.  Physiatrist is providing close team supervision and 24 hour management of active medical problems listed below.  Physiatrist and rehab team continue to assess barriers to discharge/monitor patient progress toward functional and medical goals  Care Tool:  Bathing    Body parts bathed by patient: Right arm, Left arm, Chest, Abdomen, Front perineal area, Right upper leg, Left upper leg, Face, Buttocks, Left lower leg, Right lower leg   Body parts bathed by helper: Buttocks, Right lower leg, Left lower leg     Bathing assist Assist Level: Supervision/Verbal cueing     Upper Body Dressing/Undressing Upper body dressing   What is the patient wearing?: Pull over shirt    Upper body assist Assist Level: Set up assist    Lower Body Dressing/Undressing Lower body dressing      What is the patient wearing?: Pants, Underwear/pull up     Lower body assist Assist for lower body dressing: Contact Guard/Touching assist     Toileting Toileting    Toileting assist Assist for toileting: Supervision/Verbal cueing     Transfers Chair/bed transfer  Transfers assist     Chair/bed transfer assist level: Contact Guard/Touching assist     Locomotion Ambulation   Ambulation assist      Assist level: Contact Guard/Touching assist Assistive device: Cane-straight Max distance: 200   Walk 10 feet activity   Assist     Assist level: Contact Guard/Touching assist Assistive device: Cane-straight   Walk 50 feet activity   Assist    Assist level: Contact Guard/Touching assist Assistive device: Cane-straight    Walk 150  feet activity   Assist    Assist level: Contact Guard/Touching assist Assistive device: Cane-straight    Walk 10 feet on uneven surface  activity   Assist Walk 10 feet on uneven surfaces activity did not occur: Safety/medical concerns(fatigue)         Wheelchair     Assist Will patient use wheelchair at discharge?: No Type  of Wheelchair: Manual    Wheelchair assist level: Supervision/Verbal cueing Max wheelchair distance: 150    Wheelchair 50 feet with 2 turns activity    Assist        Assist Level: Supervision/Verbal cueing   Wheelchair 150 feet activity     Assist Wheelchair 150 feet activity did not occur: Safety/medical concerns(fatigue)   Assist Level: Supervision/Verbal cueing    Medical Problem List and Plan: 1.Decreased functional ability with gait disturbancesecondary toleft corona radiata infarction. Lacunar secondary to small vessel disease -CIR PT, OT, team conf in am2. DVT Prophylaxis/Anticoagulation: Subcutaneous heparin. No bleeding episodes 3. Pain Management:Tylenol as needed 4. Mood:Provide emotional support 5. Neuropsych: This patientiscapable of making decisions on hisown behalf. 6. Skin/Wound Care:Routine skin checks 7. Fluids/Electrolytes/Nutrition:Routine in and outs with follow-up chemistries 8.Hypertension. Permissive hypertension. Patient on Tenormin 25 mg daily prior to admission. Resume as needed Controlled on 11/5 Vitals:   10/25/18 1937 10/26/18 0520  BP: 115/74 132/77  Pulse: 74 81  Resp: 18 18  Temp: 98.2 F (36.8 C) 98.6 F (37 C)  SpO2: 96% 96%  9.Diabetes mellitus with peripheral neuropathy. Hemoglobin A1c 6.5. Glucotrol 5 mg daily. Check blood sugars before meals and at bedtime CBG (last 3)  Recent Labs    10/25/18 1637 10/25/18 2134 10/26/18 0635  GLUCAP 101* 78 107*  controlled  11/5- no change in meds 10.CAD with CABG 1997. No chest pain or shortness of breath. Continue aspirin 11.History of non-Hodgkin's lymphoma. Follow-up asoutpatient 12.History of left foot drop. Patient wears an AFO brace since 2016, Fairly worn. May need some upkeep.Reviewed neuro notes, "sciatica" or compressive neuropathy I do not see MRI or EMG, no progression over time 13.Hyperlipidemia. Crestor/Lovaza     LOS: 7 days A FACE TO FACE EVALUATION WAS PERFORMED  Charlett Blake 10/26/2018, 8:41 AM

## 2018-10-27 ENCOUNTER — Inpatient Hospital Stay (HOSPITAL_COMMUNITY): Payer: Medicare Other | Admitting: Occupational Therapy

## 2018-10-27 ENCOUNTER — Inpatient Hospital Stay (HOSPITAL_COMMUNITY): Payer: Self-pay

## 2018-10-27 LAB — GLUCOSE, CAPILLARY
GLUCOSE-CAPILLARY: 118 mg/dL — AB (ref 70–99)
GLUCOSE-CAPILLARY: 65 mg/dL — AB (ref 70–99)
GLUCOSE-CAPILLARY: 102 mg/dL — AB (ref 70–99)
GLUCOSE-CAPILLARY: 95 mg/dL (ref 70–99)
Glucose-Capillary: 89 mg/dL (ref 70–99)

## 2018-10-27 NOTE — Progress Notes (Signed)
[facility] PHYSICAL MEDICINE & REHABILITATION PROGRESS NOTE   Subjective/Complaints:  Still having problem with balance particularly with standing on  foam wedge  ROS  Denies CP, SOB, N/V/D   Objective:   No results found. No results for input(s): WBC, HGB, HCT, PLT in the last 72 hours. No results for input(s): NA, K, CL, CO2, GLUCOSE, BUN, CREATININE, CALCIUM in the last 72 hours.  Intake/Output Summary (Last 24 hours) at 10/27/2018 0921 Last data filed at 10/26/2018 1830 Gross per 24 hour  Intake 600 ml  Output -  Net 600 ml     Physical Exam: Vital Signs Blood pressure 118/74, pulse 80, temperature 98 F (36.7 C), temperature source Oral, resp. rate 18, height 6' (1.829 m), weight 84 kg, SpO2 93 %.   General: No acute distress Mood and affect are appropriate Heart: Regular rate and rhythm no rubs murmurs or extra sounds Lungs: Clear to auscultation, breathing unlabored, no rales or wheezes Abdomen: Positive bowel sounds, soft nontender to palpation, nondistended Extremities: No clubbing, cyanosis, or edema, R hand thenar atrophy Skin: No evidence of breakdown, no evidence of rash Neurologic: Cranial nerves II through XII intact, motor strength is 5/5 in bilateral deltoid, bicep, tricep, grip, hip flexor, knee extensors, ankle dorsiflexor and plantar flexor except 2- Left ankle DF and toe ext 2- ankle PF Sensory exam normal sensation to light touch and proprioception in bilateral upper and lower extremities except Left side toes and dorsum of L foot,  Cerebellar exam normal finger to nose to finger as well as heel to shin in bilateral upper and lower extremities Musculoskeletal: Full range of motion in all 4 extremities. No joint swelling Gait no foot drop with Left AFO, Supervision level with cane  Assessment/Plan: 1. Functional deficits secondary to LEFT corona radiata infarct which require 3+ hours per day of interdisciplinary therapy in a comprehensive inpatient  rehab setting.  Physiatrist is providing close team supervision and 24 hour management of active medical problems listed below.  Physiatrist and rehab team continue to assess barriers to discharge/monitor patient progress toward functional and medical goals  Care Tool:  Bathing    Body parts bathed by patient: Right arm, Left arm, Chest, Abdomen, Front perineal area, Right upper leg, Left upper leg, Face, Buttocks, Left lower leg, Right lower leg   Body parts bathed by helper: Buttocks, Right lower leg, Left lower leg     Bathing assist Assist Level: Supervision/Verbal cueing     Upper Body Dressing/Undressing Upper body dressing   What is the patient wearing?: Pull over shirt    Upper body assist Assist Level: Set up assist    Lower Body Dressing/Undressing Lower body dressing      What is the patient wearing?: Pants, Underwear/pull up     Lower body assist Assist for lower body dressing: Contact Guard/Touching assist     Toileting Toileting    Toileting assist Assist for toileting: Supervision/Verbal cueing     Transfers Chair/bed transfer  Transfers assist     Chair/bed transfer assist level: Contact Guard/Touching assist     Locomotion Ambulation   Ambulation assist      Assist level: Minimal Assistance - Patient > 75% Assistive device: Cane-straight Max distance: 200   Walk 10 feet activity   Assist     Assist level: Minimal Assistance - Patient > 75% Assistive device: Cane-straight   Walk 50 feet activity   Assist    Assist level: Minimal Assistance - Patient > 75% Assistive device:  Cane-straight    Walk 150 feet activity   Assist    Assist level: Minimal Assistance - Patient > 75% Assistive device: Cane-straight    Walk 10 feet on uneven surface  activity   Assist Walk 10 feet on uneven surfaces activity did not occur: Safety/medical concerns(fatigue)         Wheelchair     Assist Will patient use wheelchair  at discharge?: No Type of Wheelchair: Manual    Wheelchair assist level: Supervision/Verbal cueing Max wheelchair distance: 150    Wheelchair 50 feet with 2 turns activity    Assist        Assist Level: Supervision/Verbal cueing   Wheelchair 150 feet activity     Assist Wheelchair 150 feet activity did not occur: Safety/medical concerns(fatigue)   Assist Level: Supervision/Verbal cueing    Medical Problem List and Plan: 1.Decreased functional ability with gait disturbancesecondary toleft corona radiata infarction. Lacunar secondary to small vessel disease Team conference today please see physician documentation under team conference tab, met with team face-to-face to discuss problems,progress, and goals. Formulized individual treatment plan based on medical history, underlying problem and comorbidities.2. DVT Prophylaxis/Anticoagulation: Subcutaneous heparin. No bleeding episodes 3. Pain Management:Tylenol as needed 4. Mood:Provide emotional support 5. Neuropsych: This patientiscapable of making decisions on hisown behalf. 6. Skin/Wound Care:Routine skin checks 7. Fluids/Electrolytes/Nutrition:Routine in and outs with follow-up chemistries 8.Hypertension. Permissive hypertension. Patient on Tenormin 25 mg daily prior to admission. Resume as needed Controlled on 11/6 Vitals:   10/26/18 1930 10/27/18 0409  BP: 113/69 118/74  Pulse: 75 80  Resp: 17 18  Temp: 98.2 F (36.8 C) 98 F (36.7 C)  SpO2: 95% 93%  9.Diabetes mellitus with peripheral neuropathy. Hemoglobin A1c 6.5. Glucotrol 5 mg daily. Check blood sugars before meals and at bedtime CBG (last 3)  Recent Labs    10/26/18 1201 10/26/18 2124 10/27/18 0623  GLUCAP 72 134* 118*  controlled  11/6- no change in meds 10.CAD with CABG 1997. No chest pain or shortness of breath. Continue aspirin 11.History of non-Hodgkin's lymphoma. Follow-up asoutpatient 12.History of  left foot drop. Patient wears an AFO brace since 2016, Fairly worn. May need some upkeep.Reviewed neuro notes, "sciatica" or compressive neuropathy Contributing to balance problem particularly with uneven surface 13.Hyperlipidemia. Crestor/Lovaza    LOS: 8 days A FACE TO FACE EVALUATION WAS PERFORMED  Charlett Blake 10/27/2018, 9:21 AM

## 2018-10-27 NOTE — Progress Notes (Signed)
Occupational Therapy Session Note  Patient Details  Name: NKOSI CORTRIGHT MRN: [id number] Date of Birth: [birth year]  Today's Date: 10/27/2018 OT Individual Time: 3810-1751 OT Individual Time Calculation (min): 73 min    Short Term Goals: Week 1:  OT Short Term Goal 1 (Week 1): Pt will perform shower transfer with min A. OT Short Term Goal 2 (Week 1): Pt will maintain balance with close supervision during LB clothing management.  OT Short Term Goal 3 (Week 1): Pt will perform bathing tasks with supervision and use of AE as needed for safety.   Skilled Therapeutic Interventions/Progress Updates:    Upon entering the room, pt seated in recliner chair awaiting OT arrival with no c/o pain. Pt requesting to shower. Pt ambulating with SPC and CGA to dresser to obtain needed clothing items. Pt requiring min verbal cuing to sit while doffing clothing safety. Pt transferred into shower with steady assistance and transferred into TTB for bathing while seated. Pt bathing at overall supervision level with lateral leans to wash buttocks for safety. Pt transferring onto commode to dress from with sit <>stand and overall close supervision for safety with balance. Pt standing at sink to comb hair with close supervision and then ambulating 500'+ with SPC and CGA onto elevator and to gift shop for simulated community mobility task. Pt navigated aisles, picked up shelf items, and verbalized potential fall risk. Pt seated in restaurant style booth with overall close supervision for seated rest break. Pt returning to room in same manner as above. Pt seated in recliner chair with chair alarm activated and call bell within reach.   Therapy Documentation Precautions:  Precautions Precautions: Fall Required Braces or Orthoses: Other Brace/Splint Other Brace/Splint: Pt wears L AFO at baseline.  Restrictions Weight Bearing Restrictions: No Other Position/Activity Restrictions: HOH ADL: ADL Grooming: Contact  guard Where Assessed-Grooming: Standing at sink Upper Body Bathing: Supervision/safety Where Assessed-Upper Body Bathing: Shower Lower Body Bathing: Supervision/safety Where Assessed-Lower Body Bathing: Shower Upper Body Dressing: Setup Where Assessed-Upper Body Dressing: Edge of bed Lower Body Dressing: Contact guard Where Assessed-Lower Body Dressing: Edge of bed Nutritional therapist Method: Heritage manager: Transfer tub bench   Therapy/Group: Individual Therapy  Gypsy Decant 10/27/2018, 12:06 PM

## 2018-10-27 NOTE — Progress Notes (Signed)
Hypoglycemic Event  CBG: 65  Treatment: Eat lunch  Symptoms:None Follow-up CBG: Time:12:30 CBG Result:102  Possible Reasons for Event: Unknown  Comments/MD notified:Dan A. PAC    Andrea Ferrer, Lawana Pai

## 2018-10-27 NOTE — Progress Notes (Signed)
Physical Therapy Weekly Progress Note  Patient Details  Name: Zachary Spence MRN: [id number] Date of Birth: [birth year]  Beginning of progress report period: October 20, 2018 End of progress report period: October 27, 2018  Today's Date: 10/27/2018 PT Individual Time: 0800-0900 and 3790-2409 PT Individual Time Calculation (min): 60 min and 58 min   Patient has met 4 of 4 short term goals. Pt demonstrates improved functional mobility and requires CGA-min assist for ambulation/transfers using a SPC. Pt continues to be limited by poor balance strategies, LE tightness and hip weakness contributing to poor balance overall.   Patient continues to demonstrate the following deficits muscle weakness, decreased coordination and decreased standing balance, decreased postural control and decreased balance strategies and therefore will continue to benefit from skilled PT intervention to increase functional independence with mobility.  Patient progressing toward long term goals..  Continue plan of care.  PT Short Term Goals Week 1:  PT Short Term Goal 1 (Week 1): pt will transfer bed>< w/c wiht min assist consistently PT Short Term Goal 1 - Progress (Week 1): Met PT Short Term Goal 2 (Week 1): pt will propel w/c x 150' wiht supervision PT Short Term Goal 2 - Progress (Week 1): Met PT Short Term Goal 3 (Week 1): pt will perform gait iwth LRAD and LAFO x 150' with min assist/close supervision PT Short Term Goal 3 - Progress (Week 1): Met PT Short Term Goal 4 (Week 1): pt will ascend/descend 4 steps 2 rails with min assist PT Short Term Goal 4 - Progress (Week 1): Met Week 2:  PT Short Term Goal 1 (Week 2): LTG=STGs due to ELOS  Skilled Therapeutic Interventions/Progress Updates:  Ambulation/gait training;Community reintegration;DME/adaptive equipment instruction;Neuromuscular re-education;Psychosocial support;Stair training;UE/LE Strength taining/ROM;Wheelchair propulsion/positioning;Discharge  planning;Balance/vestibular training;Therapeutic Activities;UE/LE Coordination activities;Functional mobility training;Patient/family education;Splinting/orthotics;Therapeutic Exercise;Visual/perceptual remediation/compensation   Session 1: Pt seated in recliner upon PT arrival, agreeable to therapy tx and denies pain. Pt ambulated from room>gym x 150 ft with SPC and min assist. Pt worked on dynamic standing balance this session without UE support: standing on rocker board anterior/posterior, standing on rocker board laterally, standing on blue wedge, one foot on dynadisc while tossing horseshoes, ambulating forwards tossing ball, ambulating backwards while tossing ball, sidestepping while tossing ball. Pt worked on balance stepping strategy training this session posteriorly x 6, to the R x 5 and to the L x 5, pt requiring min-mod assist at times to keep from losing balance. Pt performed 2 x 1 min hamstring stretching bilaterally while sitting with foot propped up on stool. Pt ambulated back to room with SPC and min assist. Pt ambulated into bathroom, performed clothing management and toileting tasks with supervision-CGA. Pt left in care of NT at end of session.  Session 2:  Pt seated in recliner upon PT arrival, agreeable to therapy tx and denies pain. Pt ambulated from room>gym x 150 ft with CGA. Pt continued to work on stepping balance strategies this session posteriorly with x 5 stepping back with R LE counted down, x 5 stepping back with L LE counted down and x 5 at random. Pt worked on hip strategies while standing on rocker board anterior/posterior x 1 trial and lateral rocking x 1 trial, pt occasionally reaches for parallel bars for UE supports. Worked on standing balance: tandem stance, romberg, romberg with eyes closed, all with min assist for balance. Worked on dynamic standing balance ambulating forwards/backwards while tossing ball x 2 trials and x 2 trials with added cognitive task of naming  sports teams. Therapist provided education to pt and his son about turning lights on when getting up in the middle of the night as the pt is visually reliant for balance. Pt ambulated back to room and left seated in recliner with chair alarm set and son present.   Therapy Documentation Precautions:  Precautions Precautions: Fall Required Braces or Orthoses: Other Brace/Splint Other Brace/Splint: Pt wears L AFO at baseline.  Restrictions Weight Bearing Restrictions: No Other Position/Activity Restrictions: HOH   Therapy/Group: Individual Therapy  Netta Corrigan, PT, DPT 10/27/2018, 7:49 AM

## 2018-10-28 ENCOUNTER — Inpatient Hospital Stay (HOSPITAL_COMMUNITY): Payer: Medicare Other

## 2018-10-28 ENCOUNTER — Inpatient Hospital Stay (HOSPITAL_COMMUNITY): Payer: Medicare Other | Admitting: Physical Therapy

## 2018-10-28 LAB — GLUCOSE, CAPILLARY
Glucose-Capillary: 119 mg/dL — ABNORMAL HIGH (ref 70–99)
Glucose-Capillary: 95 mg/dL (ref 70–99)
Glucose-Capillary: 75 mg/dL (ref 70–99)
Glucose-Capillary: 107 mg/dL — ABNORMAL HIGH (ref 70–99)

## 2018-10-28 MED ORDER — GLIPIZIDE 5 MG PO TABS
2.5000 mg | ORAL_TABLET | Freq: Every day | ORAL | Status: DC
  Administered 2018-10-29 – 2018-10-31 (×3): 2.5 mg via ORAL
  Filled 2018-10-28 (×4): qty 1

## 2018-10-28 NOTE — Progress Notes (Addendum)
Physical Therapy Session Note  Patient Details  Name: Zachary Spence MRN: [id number] Date of Birth: 23-Dec-1922  Today's Date: 10/28/2018 PT Individual Time: 0800-0855 AND 1435-1331 PT Individual Time Calculation (min): 55 min AND 56 min  Short Term Goals: Week 2:  PT Short Term Goal 1 (Week 2): LTG=STGs due to ELOS  Skilled Therapeutic Interventions/Progress Updates:   Session 1:  Pt in recliner and agreeable to therapy, denies pain. Ambulated to/from therapy gym w/ close supervision using RW. Worked on anterior weight shifting and hip balance strategies this session. Performed UE reaching tasks requiring pt to reach to eye level and maintain balance w/ posterior postural perturbations in stance. Performed this task feet together, feet apart, and on foam wedge to promote gastroc stretching and to further promote anterior weight shifting. Performed blocked practice of sit<>stands from mat while holding large ball to facilitate anterior weight shifting, 20+ reps w/ improved trunk anterior weight shifting on sit<>stands w/o the ball afterwards. Min assist fading to CGA w/ all standing balance activities and functional transitions this session, tactile and verbal cues for anterior weight shifting at hips frequently throughout session.  Finished session w/ limits of stability training on biodex w/ emphasis on anterior/posterior weight shifting. Returned to room and ended session in recliner, all needs in reach.   Session 2:  Pt in recliner and agreeable to therapy, denies pain. Ambulated to/from therapy gym w/ close supervision using RW. Worked on maintaining balance w/ postural perturbations in stance while tossing ball, close supervision to min assist w/ verbal cues for balance strategies. Performed sit<>stands w/ large ball to facilitate trunk anterior weight shifting, while standing on red foam, min-mod assist to stabilize in stance. Worked on hip strengthening exercises in supine on mat including  supine bridge 3x1 and resisted hip abduction w/ green theraband 3x10. Passive stretching performing during rest breaks including trunk rotation in hookling, hamstring stretch, and prone hip flexor stretch. 30 sec holds for each stretch. Pt noted to be bleeding after turning to prone w/ min guard, small skin tear noticed at outside R elbow. RN made aware and present to clean and bandage, pt denied any pain or lightheadedness. Returned to room and assisted w/ toilet, supervision for pericare and LE garment management. Ended session in recliner, all needs in reach.   Therapy Documentation Precautions:  Precautions Precautions: Fall Required Braces or Orthoses: Other Brace/Splint Other Brace/Splint: Pt wears L AFO at baseline.  Restrictions Weight Bearing Restrictions: No Other Position/Activity Restrictions: HOH Vital Signs: Therapy Vitals Temp: 97.7 F (36.5 C) Temp Source: Oral Pulse Rate: 81 Resp: 18 BP: 108/75 Patient Position (if appropriate): Lying Oxygen Therapy SpO2: 95 % O2 Device: Room Air Pain: Pain Assessment Pain Scale: 0-10 Pain Score: 0-No pain  Therapy/Group: Individual Therapy  Zachary Spence Clent Demark 10/28/2018, 8:56 AM

## 2018-10-28 NOTE — Progress Notes (Signed)
Occupational Therapy Session Note  Patient Details  Name: Zachary Spence MRN: [id number] Date of Birth: 1923-11-20  Today's Date: 10/28/2018 OT Individual Time: 0930-1040 OT Individual Time Calculation (min): 70 min    Short Term Goals: Week 1:  OT Short Term Goal 1 (Week 1): Pt will perform shower transfer with min A. OT Short Term Goal 2 (Week 1): Pt will maintain balance with close supervision during LB clothing management.  OT Short Term Goal 3 (Week 1): Pt will perform bathing tasks with supervision and use of AE as needed for safety.   Skilled Therapeutic Interventions/Progress Updates:    Pt resting in recliner upon arrival.  Pt declined bathing this morning (showered previous day) and changing clothing (just changed). OT intervention with focus on functional amb with RW, home mgmt tasks at RW level, toileting, dynamic standing balance, BLE therex on NuStep, and activity tolerance to increase independence with BADLs. Pt amb with RW to ADL apartment and engaged in simple home mgmt tasks at [facility] level. Pt issued walker bag and educated pt on RW safety. Pt amb to Day Room and initially engaged in Wii baseball game.  Pt stood 12 mins to complete game with no LOB. Pt transitioned to Biodex for maze activity and game of catch.  Pt noted with difficulty grading balance reactions and shifting weight onto heels.  Pt commented on these deficits.  Pt engaged in BLE therex on NuStep (Work load 5 for 8 mins BLE only). Pt returned to room and used toilet prior to amb to recliner.  Pt remained in recliner with all needs within reach and chair alarm activated.   Therapy Documentation Precautions:  Precautions Precautions: Fall Required Braces or Orthoses: Other Brace/Splint Other Brace/Splint: Pt wears L AFO at baseline.  Restrictions Weight Bearing Restrictions: No Other Position/Activity Restrictions: HOH Pain: Pain Assessment Pain Scale: 0-10 Pain Score: 0-No pain   Therapy/Group:  Individual Therapy  Leroy Libman 10/28/2018, 11:39 AM

## 2018-10-28 NOTE — Progress Notes (Signed)
Pt had small skin tear in right upper posterior arm. Pt states no pain at the site. Area cleansed and foam dressing applied. Continue plan of care.  Kazaria Gaertner W Jakim Drapeau

## 2018-10-28 NOTE — Progress Notes (Signed)
[place] PHYSICAL MEDICINE & REHABILITATION PROGRESS NOTE   Subjective/Complaints:  Discussed CBGs as well as balance issues  ROS  Denies CP, SOB, N/V/D   Objective:   No results found. No results for input(s): WBC, HGB, HCT, PLT in the last 72 hours. No results for input(s): NA, K, CL, CO2, GLUCOSE, BUN, CREATININE, CALCIUM in the last 72 hours.  Intake/Output Summary (Last 24 hours) at 10/28/2018 0856 Last data filed at 10/27/2018 1737 Gross per 24 hour  Intake 480 ml  Output -  Net 480 ml     Physical Exam: Vital Signs Blood pressure 108/75, pulse 81, temperature 97.7 F (36.5 C), temperature source Oral, resp. rate 18, height 6' (1.829 m), weight 84 kg, SpO2 95 %.   General: No acute distress Mood and affect are appropriate Heart: Regular rate and rhythm no rubs murmurs or extra sounds Lungs: Clear to auscultation, breathing unlabored, no rales or wheezes Abdomen: Positive bowel sounds, soft nontender to palpation, nondistended Extremities: No clubbing, cyanosis, or edema, R hand thenar atrophy Skin: No evidence of breakdown, no evidence of rash Neurologic: Cranial nerves II through XII intact, motor strength is 5/5 in bilateral deltoid, bicep, tricep, grip, hip flexor, knee extensors, ankle dorsiflexor and plantar flexor except 2- Left ankle DF and toe ext 2- ankle PF Sensory exam normal sensation to light touch and proprioception in bilateral upper and lower extremities except Left side toes and dorsum of L foot,  Cerebellar exam normal finger to nose to finger as well as heel to shin in bilateral upper and lower extremities Musculoskeletal: Full range of motion in all 4 extremities. No joint swelling Gait no foot drop with Left AFO, Supervision level with cane  Assessment/Plan: 1. Functional deficits secondary to LEFT corona radiata infarct which require 3+ hours per day of interdisciplinary therapy in a comprehensive inpatient rehab setting.  Physiatrist is  providing close team supervision and 24 hour management of active medical problems listed below.  Physiatrist and rehab team continue to assess barriers to discharge/monitor patient progress toward functional and medical goals  Care Tool:  Bathing    Body parts bathed by patient: Right arm, Left arm, Chest, Abdomen, Front perineal area, Right upper leg, Left upper leg, Face, Buttocks, Left lower leg, Right lower leg   Body parts bathed by helper: Buttocks, Right lower leg, Left lower leg     Bathing assist Assist Level: Supervision/Verbal cueing     Upper Body Dressing/Undressing Upper body dressing   What is the patient wearing?: Pull over shirt    Upper body assist Assist Level: Supervision/Verbal cueing    Lower Body Dressing/Undressing Lower body dressing      What is the patient wearing?: Pants, Underwear/pull up     Lower body assist Assist for lower body dressing: Supervision/Verbal cueing     Toileting Toileting    Toileting assist Assist for toileting: Supervision/Verbal cueing     Transfers Chair/bed transfer  Transfers assist     Chair/bed transfer assist level: Contact Guard/Touching assist     Locomotion Ambulation   Ambulation assist      Assist level: Contact Guard/Touching assist Assistive device: Cane-straight Max distance: 500'   Walk 10 feet activity   Assist     Assist level: Contact Guard/Touching assist Assistive device: Cane-straight   Walk 50 feet activity   Assist    Assist level: Contact Guard/Touching assist Assistive device: Cane-straight    Walk 150 feet activity   Assist    Assist level:  Contact Guard/Touching assist Assistive device: Cane-straight    Walk 10 feet on uneven surface  activity   Assist Walk 10 feet on uneven surfaces activity did not occur: Safety/medical concerns(fatigue)         Wheelchair     Assist Will patient use wheelchair at discharge?: No Type of Wheelchair:  Manual    Wheelchair assist level: Supervision/Verbal cueing Max wheelchair distance: 150    Wheelchair 50 feet with 2 turns activity    Assist        Assist Level: Supervision/Verbal cueing   Wheelchair 150 feet activity     Assist Wheelchair 150 feet activity did not occur: Safety/medical concerns(fatigue)   Assist Level: Supervision/Verbal cueing    Medical Problem List and Plan: 1.Decreased functional ability with gait disturbancesecondary toleft corona radiata infarction. Lacunar secondary to small vessel disease cont CIR PT, OT, ELOS 11/13 2. DVT Prophylaxis/Anticoagulation: Subcutaneous heparin. No bleeding episodes 3. Pain Management:Tylenol as needed 4. Mood:Provide emotional support 5. Neuropsych: This patientiscapable of making decisions on hisown behalf. 6. Skin/Wound Care:Routine skin checks 7. Fluids/Electrolytes/Nutrition:Routine in and outs with follow-up chemistries 8.Hypertension. Permissive hypertension. Patient on Tenormin 25 mg daily prior to admission. Resume as needed Controlled on 11/7 Vitals:   10/27/18 1958 10/28/18 0521  BP: (!) 120/55 108/75  Pulse: 72 81  Resp: 18 18  Temp: 98.3 F (36.8 C) 97.7 F (36.5 C)  SpO2: 94% 95%  9.Diabetes mellitus with peripheral neuropathy. Hemoglobin A1c 6.5. Glucotrol 5 mg daily. Check blood sugars before meals and at bedtime CBG (last 3)  Recent Labs    10/27/18 1702 10/27/18 2203 10/28/18 0700  GLUCAP 89 95 119*  pt dipping below 70 on several occasion over the past several days, appetite is good will reduce glipizide to 2.5 ( currently on home dose of 5mg ) 10.CAD with CABG 1997. No chest pain or shortness of breath. Continue aspirin 11.History of non-Hodgkin's lymphoma. Follow-up asoutpatient 12.History of left foot drop. Patient wears an AFO brace since 2016, Fairly worn. May need some upkeep.Reviewed neuro notes, "sciatica" or compressive  neuropathy Contributing to balance problem particularly with uneven surface 13.Hyperlipidemia. Crestor/Lovaza    LOS: 9 days A FACE TO FACE EVALUATION WAS PERFORMED  Charlett Blake 10/28/2018, 8:56 AM

## 2018-10-29 ENCOUNTER — Inpatient Hospital Stay (HOSPITAL_COMMUNITY): Payer: Medicare Other

## 2018-10-29 ENCOUNTER — Inpatient Hospital Stay (HOSPITAL_COMMUNITY): Payer: Medicare Other | Admitting: Physical Therapy

## 2018-10-29 LAB — GLUCOSE, CAPILLARY
GLUCOSE-CAPILLARY: 82 mg/dL (ref 70–99)
GLUCOSE-CAPILLARY: 104 mg/dL — AB (ref 70–99)
Glucose-Capillary: 107 mg/dL — ABNORMAL HIGH (ref 70–99)
Glucose-Capillary: 95 mg/dL (ref 70–99)

## 2018-10-29 NOTE — Progress Notes (Signed)
Social Work Patient ID: Zachary Spence, male   DOB: 05-24-1923, 84 y.o.   MRN: [id number]   CSW met with pt 10-27-18 to update him on team conference discussion and targeted d/c date of 11-03-18.  Pt is pleased with this.  CSW also spoke with pt's son via telephone and he will be prepared for pt to come home, taking his d/c date off and coming for family education on 11-01-18.  CSW will arrange [facility] and order any needed DME.  Pt and son were appreciative.  No current concern/needs/questions.  CSW will continue to follow and assist as needed.

## 2018-10-29 NOTE — Progress Notes (Signed)
[place] PHYSICAL MEDICINE & REHABILITATION PROGRESS NOTE   Subjective/Complaints:  Seen ingym, working on balance , L AFO on  ROS  Denies CP, SOB, N/V/D   Objective:   No results found. No results for input(s): WBC, HGB, HCT, PLT in the last 72 hours. No results for input(s): NA, K, CL, CO2, GLUCOSE, BUN, CREATININE, CALCIUM in the last 72 hours.  Intake/Output Summary (Last 24 hours) at 10/29/2018 0812 Last data filed at 10/28/2018 1855 Gross per 24 hour  Intake 780 ml  Output -  Net 780 ml     Physical Exam: Vital Signs Blood pressure 106/76, pulse 82, temperature 97.6 F (36.4 C), temperature source Oral, resp. rate 18, height 6' (1.829 m), weight 84 kg, SpO2 94 %.   General: No acute distress Mood and affect are appropriate Heart: Regular rate and rhythm no rubs murmurs or extra sounds Lungs: Clear to auscultation, breathing unlabored, no rales or wheezes Abdomen: Positive bowel sounds, soft nontender to palpation, nondistended Extremities: No clubbing, cyanosis, or edema, R hand thenar atrophy Skin: No evidence of breakdown, no evidence of rash Neurologic: Cranial nerves II through XII intact, motor strength is 5/5 in bilateral deltoid, bicep, tricep, 4/5 grip, hip flexor, knee extensors, ankle dorsiflexor and plantar flexor except 2- Left ankle DF and toe ext 2- ankle PF Sensory exam normal sensation to light touch and proprioception in bilateral upper and lower extremities except Left side toes and dorsum of L foot,  Cerebellar exam normal finger to nose to finger as well as heel to shin in bilateral upper and lower extremities Musculoskeletal: Full range of motion in all 4 extremities. No joint swelling Gait no foot drop with Left AFO, Supervision level with cane  Assessment/Plan: 1. Functional deficits secondary to LEFT corona radiata infarct which require 3+ hours per day of interdisciplinary therapy in a comprehensive inpatient rehab setting.  Physiatrist is  providing close team supervision and 24 hour management of active medical problems listed below.  Physiatrist and rehab team continue to assess barriers to discharge/monitor patient progress toward functional and medical goals  Care Tool:  Bathing  Bathing activity did not occur: Refused Body parts bathed by patient: Right arm, Left arm, Chest, Abdomen, Front perineal area, Right upper leg, Left upper leg, Face, Buttocks, Left lower leg, Right lower leg   Body parts bathed by helper: Buttocks, Right lower leg, Left lower leg     Bathing assist Assist Level: Supervision/Verbal cueing     Upper Body Dressing/Undressing Upper body dressing Upper body dressing/undressing activity did not occur (including orthotics): Refused What is the patient wearing?: Pull over shirt    Upper body assist Assist Level: Supervision/Verbal cueing    Lower Body Dressing/Undressing Lower body dressing    Lower body dressing activity did not occur: Refused What is the patient wearing?: Pants, Underwear/pull up     Lower body assist Assist for lower body dressing: Supervision/Verbal cueing     Toileting Toileting    Toileting assist Assist for toileting: Supervision/Verbal cueing     Transfers Chair/bed transfer  Transfers assist     Chair/bed transfer assist level: Supervision/Verbal cueing     Locomotion Ambulation   Ambulation assist      Assist level: Supervision/Verbal cueing Assistive device: Walker-rolling Max distance: 200'   Walk 10 feet activity   Assist     Assist level: Supervision/Verbal cueing Assistive device: Walker-rolling   Walk 50 feet activity   Assist    Assist level: Supervision/Verbal cueing Assistive  device: Walker-rolling    Walk 150 feet activity   Assist    Assist level: Supervision/Verbal cueing Assistive device: Walker-rolling    Walk 10 feet on uneven surface  activity   Assist Walk 10 feet on uneven surfaces activity did  not occur: Safety/medical concerns(fatigue)         Wheelchair     Assist Will patient use wheelchair at discharge?: No Type of Wheelchair: Manual    Wheelchair assist level: Supervision/Verbal cueing Max wheelchair distance: 150    Wheelchair 50 feet with 2 turns activity    Assist        Assist Level: Supervision/Verbal cueing   Wheelchair 150 feet activity     Assist Wheelchair 150 feet activity did not occur: Safety/medical concerns(fatigue)   Assist Level: Supervision/Verbal cueing    Medical Problem List and Plan: 1.Decreased functional ability with gait disturbancesecondary toleft corona radiata infarction. Lacunar secondary to small vessel disease cont CIR PT, OT, ELOS 11/13 2. DVT Prophylaxis/Anticoagulation: Subcutaneous heparin. No bleeding episodes 3. Pain Management:Tylenol as needed 4. Mood:Provide emotional support 5. Neuropsych: This patientiscapable of making decisions on hisown behalf. 6. Skin/Wound Care:Routine skin checks 7. Fluids/Electrolytes/Nutrition:Routine in and outs with follow-up chemistries 8.Hypertension. Permissive hypertension. Patient on Tenormin 25 mg daily prior to admission. Resume as needed Controlled on 11/8 Vitals:   10/28/18 1535 10/29/18 0614  BP: 125/65 106/76  Pulse: 78 82  Resp: 18 18  Temp: 97.6 F (36.4 C)   SpO2: 97% 94%  9.Diabetes mellitus with peripheral neuropathy. Hemoglobin A1c 6.5. Glucotrol 5 mg daily. Check blood sugars before meals and at bedtime CBG (last 3)  Recent Labs    10/28/18 1620 10/28/18 2210 10/29/18 0659  GLUCAP 75 107* 107*  pt dipping below 70 on several occasion over the past several days, appetite is good will reduce glipizide to 2.5 ( currently on home dose of 5mg )- monitor, if further dips below 70 will d/c 10.CAD with CABG 1997. No chest pain or shortness of breath. Continue aspirin 11.History of non-Hodgkin's lymphoma. Follow-up  asoutpatient 12.History of left foot drop. Patient wears an AFO brace since 2016, Fairly worn. May need some upkeep.Reviewed neuro notes, "sciatica" or compressive neuropathy Contributing to balance problem particularly with uneven surface 13.Hyperlipidemia. Crestor/Lovaza  14.  Hx CTS with bilateral hand weakness  LOS: 10 days A FACE TO FACE EVALUATION WAS PERFORMED  Charlett Blake 10/29/2018, 8:12 AM

## 2018-10-29 NOTE — Progress Notes (Signed)
Occupational Therapy Session Note  Patient Details  Name: Zachary Spence MRN: [id number] Date of Birth: Aug 25, 1923  Today's Date: 10/29/2018 OT Individual Time: 1340-1425 OT Individual Time Calculation (min): 45 min    Short Term Goals: Week 1:  OT Short Term Goal 1 (Week 1): Pt will perform shower transfer with min A. OT Short Term Goal 1 - Progress (Week 1): Met OT Short Term Goal 2 (Week 1): Pt will maintain balance with close supervision during LB clothing management.  OT Short Term Goal 2 - Progress (Week 1): Met OT Short Term Goal 3 (Week 1): Pt will perform bathing tasks with supervision and use of AE as needed for safety.  OT Short Term Goal 3 - Progress (Week 1): Met  Skilled Therapeutic Interventions/Progress Updates:    1:1. Pt received seated in recliner. Pt completes all ambulation with SPC and CGA. Pt completes recliner (rocking) transfer with cane with CGA. Pt educated on placing large book under chair to inhibit rocking PRN at home. Pt completes kitchen search activity in ADL apartment with [facility] and VC for sliding large mixing bowl across counter to gather all needed items from various shelves with supervision. Pt completes seated ball toss (chest, bounce and overhead pass) 3x30 reps with supervision to improve BUE strengthe/coordination required for BADLs. Pt completes 9HPT RUE: 1 min 8 sec LUE:55 sec. Exited session with pt seatd in recliner and call light in reach  Therapy Documentation Precautions:  Precautions Precautions: Fall Required Braces or Orthoses: Other Brace/Splint Other Brace/Splint: Pt wears L AFO at baseline.  Restrictions Weight Bearing Restrictions: No Other Position/Activity Restrictions: HOH General:   Therapy/Group: Individual Therapy  Tonny Branch 10/29/2018, 2:27 PM

## 2018-10-29 NOTE — Progress Notes (Signed)
Occupational Therapy Session Note  Patient Details  Name: Zachary Spence MRN: [id number] Date of Birth: July 28, 1923  Today's Date: 10/29/2018 OT Individual Time: 1300-1330 OT Individual Time Calculation (min): 30 min    Short Term Goals: Week 2:  OT Short Term Goal 1 (Week 2): STG=LTG secondary to ELOS  Skilled Therapeutic Interventions/Progress Updates:    OT intervention with focus on functional amb with RW, standing balance, toileting, bathing at shower level, dressing with sit<>stand, activity tolerance, and safety awareness to increase independence with BADLs. Pt completed all tasks and functional amb at supervision level.  Pt requires UE support when standing to maintain standing balance. Pt completes bathing tasks with sit<>stand from tub bench.  Pt completed grooming tasks standing at sink.  Pt remained in recliner with chair alarm activated and all needs within reach.   Therapy Documentation Precautions:  Precautions Precautions: Fall Required Braces or Orthoses: Other Brace/Splint Other Brace/Splint: Pt wears L AFO at baseline.  Restrictions Weight Bearing Restrictions: No Other Position/Activity Restrictions: HOH   Pain:  Pt denies pain   Therapy/Group: Individual Therapy  Leroy Libman 10/29/2018, 1:58 PM

## 2018-10-29 NOTE — Progress Notes (Signed)
Physical Therapy Session Note  Patient Details  Name: Zachary Spence MRN: [id number] Date of Birth: 08-May-1923  Today's Date: 10/29/2018 PT Individual Time: 1430-1515 PT Individual Time Calculation (min): 45 min   Short Term Goals: Week 1:  PT Short Term Goal 1 (Week 1): pt will transfer bed>< w/c wiht min assist consistently PT Short Term Goal 1 - Progress (Week 1): Met PT Short Term Goal 2 (Week 1): pt will propel w/c x 150' wiht supervision PT Short Term Goal 2 - Progress (Week 1): Met PT Short Term Goal 3 (Week 1): pt will perform gait iwth LRAD and LAFO x 150' with min assist/close supervision PT Short Term Goal 3 - Progress (Week 1): Met PT Short Term Goal 4 (Week 1): pt will ascend/descend 4 steps 2 rails with min assist PT Short Term Goal 4 - Progress (Week 1): Met Week 2:  PT Short Term Goal 1 (Week 2): LTG=STGs due to ELOS  Skilled Therapeutic Interventions/Progress Updates:    Session focused on NMR for balance re-training, gait training with SPC in distracting environment, and administered standardized balance tests (see below) for fall risk assessment. Pt able to gait on unit with supervision to steadying assist with [facility], noted with LOB occurring to the right and physical assist to correct needed. Pt aware of his tendency to drift to R. [employer] for NMR using limits of stability and random control programs with static and compliant surface (level 10) and use of BUE, no UE, and single UE support to increase challenge requiring min tactile cues for weightshifting. In parallel bars, pt on wedge for balance challenge and no UE support completing stacking of cones on moveable surface and holding with BUEs. Added challenge of head turns, pt with frequent LOB posteriorly requiring min assist overall to correct. End of session transferred to toilet with supervision and handoff to RN.   Therapy Documentation Precautions:  Precautions Precautions: Fall Required Braces or Orthoses: Other  Brace/Splint Other Brace/Splint: Pt wears L AFO at baseline.  Restrictions Weight Bearing Restrictions: No Other Position/Activity Restrictions: HOH Pain: Pain Assessment Pain Scale: 0-10 Pain Score: 0-No painNo complaints.    Balance: Berg Balance Test Sit to Stand: Able to stand without using hands and stabilize independently Standing Unsupported: Able to stand 2 minutes with supervision Sitting with Back Unsupported but Feet Supported on Floor or Stool: Able to sit safely and securely 2 minutes Stand to Sit: Sits safely with minimal use of hands Transfers: Able to transfer safely, definite need of hands Standing Unsupported with Eyes Closed: Able to stand 3 seconds Standing Ubsupported with Feet Together: Able to place feet together independently but unable to hold for 30 seconds From Standing, Reach Forward with Outstretched Arm: Can reach forward >5 cm safely (2") From Standing Position, Pick up Object from Floor: Able to pick up shoe, needs supervision From Standing Position, Turn to Look Behind Over each Shoulder: Needs supervision when turning Turn 360 Degrees: Needs close supervision or verbal cueing Standing Unsupported, Alternately Place Feet on Step/Stool: Able to complete >2 steps/needs minimal assist Standing Unsupported, One Foot in Front: Loses balance while stepping or standing Standing on One Leg: Unable to try or needs assist to prevent fall Total Score: 30    Five times Sit to Stand Test (FTSS) Method: Use a straight back chair with a solid seat that is 16-18" high. Ask participant to sit on the chair with arms folded across their chest.   Instructions: "Stand up and sit down as  quickly as possible 5 times, keeping your arms folded across your chest."   Measurement: Stop timing when the participant stands the 5th time.  TIME: _17_____ (in seconds)  Times > 13.6 seconds is associated with increased disability and morbidity (Guralnik, 2000) Times > 15 seconds  is predictive of recurrent falls in healthy individuals aged 65 and older (Buatois, et al., 2008) Normal performance values in community dwelling individuals aged 60 and older (Bohannon, 2006): o 60-69 years: 11.4 seconds o 70-79 years: 12.6 seconds o 80-89 years: 14.8 seconds  MCID: ? 2.3 seconds for Vestibular Disorders (Meretta, 2006)   Therapy/Group: Individual Therapy  Gray, Alison Brescia  Alison B. Gray, PT, DPT, CBIS  10/29/2018, 3:34 PM  

## 2018-10-29 NOTE — Plan of Care (Signed)
  Problem: Consults Goal: RH STROKE PATIENT EDUCATION Description See Patient Education module for education specifics  Outcome: Progressing   Problem: RH BLADDER ELIMINATION Goal: RH STG MANAGE BLADDER WITH ASSISTANCE Description STG Manage Bladder With Mod I Assistance  Outcome: Progressing   Problem: RH SKIN INTEGRITY Goal: RH STG SKIN FREE OF INFECTION/BREAKDOWN Description No new breakdown with min assist    Outcome: Progressing   Problem: RH SAFETY Goal: RH STG ADHERE TO SAFETY PRECAUTIONS W/ASSISTANCE/DEVICE Description STG Adhere to Safety Precautions With Mod I Assistance/Device.  Outcome: Progressing   Problem: RH PAIN MANAGEMENT Goal: RH STG PAIN MANAGED AT OR BELOW PT'S PAIN GOAL Description <3 out of 10.   Outcome: Progressing

## 2018-10-29 NOTE — Progress Notes (Signed)
Occupational Therapy Session Note  Patient Details  Name: Zachary Spence MRN: [id number] Date of Birth: Mar 05, 1923  Today's Date: 10/29/2018 OT Individual Time: 0800-0900 OT Individual Time Calculation (min): 60 min    Short Term Goals: Week 2:  OT Short Term Goal 1 (Week 2): STG=LTG secondary to ELOS  Skilled Therapeutic Interventions/Progress Updates:    OT intervention with focus on functional amb with RW, toileting, dynamic standing balance, activity tolerance, and safety awareness to increase independence with BADLs. Pt amb with RW to gym and engaged in dynamic standing activties and functional amb with RW for simple home mgmt tasks. Pt amb with RW to [employer] and engaged in [employer] activities on compliant and non compliant surfaces.  Pt required steady A for activity on compliant surface.  Pt also engaged in balance activities on balance board on Wii.  Pt required steady A for activities without UE support. Pt returned to room and requested to use toilet (supervision). Pt remained in recliner with all needs within reach and chair alarm activated.   Therapy Documentation Precautions:  Precautions Precautions: Fall Required Braces or Orthoses: Other Brace/Splint Other Brace/Splint: Pt wears L AFO at baseline.  Restrictions Weight Bearing Restrictions: No Other Position/Activity Restrictions: HOH   Pain:  Pt denies pain   Therapy/Group: Individual Therapy  Leroy Libman 10/29/2018, 12:10 PM

## 2018-10-29 NOTE — Patient Care Conference (Addendum)
Inpatient RehabilitationTeam Conference and Plan of Care Update Date: 10/27/2018   Time: 11:00 AM    Patient Name: Zachary Spence      Medical Record Number: [id number]  Date of Birth: 1923-07-19 Sex: Male         Room/Bed: 4W25C/4W25C-01 Payor Info: Payor: MEDICARE / Plan: MEDICARE PART A AND B / Product Type: *No Product type* /    Admitting Diagnosis: CVA  Admit Date/Time:  10/19/2018  4:42 PM Admission Comments: No comment available   Primary Diagnosis:  <principal problem not specified> Principal Problem: <principal problem not specified>  Patient Active Problem List   Diagnosis Date Noted  . Gait disturbance, post-stroke   . Slow transit constipation   . Small vessel disease ([place]) 10/19/2018  . Benign essential HTN   . Stage 2 chronic kidney disease   . Sequela of lacunar infarction   . CVA (cerebral vascular accident) ([place]) 10/17/2018  . Burning sensation of the foot 04/12/2015  . Leg weakness 01/08/2015  . Carpal tunnel syndrome 12/26/2014  . Dropfoot 12/26/2014  . Neuralgia neuritis, sciatic nerve 12/26/2014  . Diabetes mellitus ([place]) 08/24/2014  . BP (high blood pressure) 08/24/2014  . HLD (hyperlipidemia) 08/24/2014  . H/O coronary artery bypass surgery 08/24/2014    Expected Discharge Date: Expected Discharge Date: 11/03/18  Team Members Present: Physician leading conference: Dr. Alysia Penna Social Worker Present: Alfonse Alpers, LCSW Nurse Present: Dorien Chihuahua, RN PT Present: Michaelene Song, PT OT Present: Willeen Cass, OT;Elisabeth Doe, OT SLP Present: Weston Anna, SLP PPS Coordinator present : Daiva Nakayama, RN, CRRN     Current Status/Progress Goal Weekly Team Focus  Medical   Patient without significant hemiparesis but still has balance disorder, chronic left foot drop  Maintain medical stability, reduce fall risk  Discharge planning   Bowel/Bladder   Continent of bowel and bladder LBM:11/5  Remain continent of bowel and bladder  Asses  bowel and bladder needs q shift and PRN and offer toileting q 2 hours while awake   Swallow/Nutrition/ Hydration             ADL's   Supervision with self care, CGA with transfers  mod I goals  ADL training, functional mobility, balance, pt education   Mobility   CGA-min assist overall w/ SPC >150', can be up to max assist to correct LOB  modified independent-supervision overall, household and community gait  balance w/ all functional mobility, global strengthening   Communication             Safety/Cognition/ Behavioral Observations            Pain   No complaints of pain  Pain </=3/10  Assess pain q shift and PRN and medicate when necessary   Skin   Skin tear and abrasion to right leg  Remain free from skin breakdown and infection  Assess skin q shift and prn    Rehab Goals Patient on target to meet rehab goals: Yes Rehab Goals Revised: none *See Care Plan and progress notes for long and short-term goals.     Barriers to Discharge  Current Status/Progress Possible Resolutions Date Resolved   Physician    Medical stability     Progressing towards goals  Plan discharge this week      Nursing                  PT  Decreased caregiver support  son works during the weekday  working towards [employer] as he used  SPC PTA but may use RW when at home for safety, TBD           OT                  SLP                SW                Discharge Planning/Teaching Needs:  Pt plans to return to his home he shares with his son.  Son is coming for family education on 11-01-18 from 10am-noon.   Team Discussion:  Pt is doing well medically.  Pt is safe in his room and is continent of bowel and bladder.  Pt's balance reactions are poor and cannot correct his LOB, but PT is working on strategies with him.  Pt is min A/contact guard, but with balance he needs mod A to correct.  PT is figuring out if cane or walker would be safer for pt.  His BERG score was 27/56.  Pt is contact guard with OT  and they are working on dynamic standing balance tasks.    Revisions to Treatment Plan:  none    Continued Need for Acute Rehabilitation Level of Care: The patient requires daily medical management by a physician with specialized training in physical medicine and rehabilitation for the following conditions: Daily direction of a multidisciplinary physical rehabilitation program to ensure safe treatment while eliciting the highest outcome that is of practical value to the patient.: Yes Daily medical management of patient stability for increased activity during participation in an intensive rehabilitation regime.: Yes Daily analysis of laboratory values and/or radiology reports with any subsequent need for medication adjustment of medical intervention for : Neurological problems   I attest that I was present, lead the team conference, and concur with the assessment and plan of the team.   Cayce Quezada, Silvestre Mesi 10/29/2018, 11:39 PM

## 2018-10-29 NOTE — Progress Notes (Signed)
Occupational Therapy Weekly Progress Note  Patient Details  Name: Zachary Spence MRN: [id number] Date of Birth: February 05, 1923  Beginning of progress report period: October 20, 2018 End of progress report period: October 29, 2018  Patient has met 3 of 3 short term goals.  Pt has made excellent progress with BADLs and IADLs since admission.  Pt completes all BADLs at supervision level with more than a reasonable amount of time. Pt completes simple home mgmt tasks and kitchen tasks at supervision level.  Pt's son will be coming in for education on 11/11.  Patient continues to demonstrate the following deficits: muscle weakness, unbalanced muscle activation and decreased standing balance and decreased balance strategies and therefore will continue to benefit from skilled OT intervention to enhance overall performance with BADL and iADL.  Patient progressing toward long term goals..  Continue plan of care.  OT Short Term Goals Week 1:  OT Short Term Goal 1 (Week 1): Pt will perform shower transfer with min A. OT Short Term Goal 1 - Progress (Week 1): Met OT Short Term Goal 2 (Week 1): Pt will maintain balance with close supervision during LB clothing management.  OT Short Term Goal 2 - Progress (Week 1): Met OT Short Term Goal 3 (Week 1): Pt will perform bathing tasks with supervision and use of AE as needed for safety.  OT Short Term Goal 3 - Progress (Week 1): Met Week 2:  OT Short Term Goal 1 (Week 2): STG=LTG secondary to ELOS      Therapy Documentation Precautions:  Precautions Precautions: Fall Required Braces or Orthoses: Other Brace/Splint Other Brace/Splint: Pt wears L AFO at baseline.  Restrictions Weight Bearing Restrictions: No Other Position/Activity Restrictions: HOH   Leroy Libman 10/29/2018, 6:53 AM

## 2018-10-30 ENCOUNTER — Inpatient Hospital Stay (HOSPITAL_COMMUNITY): Payer: Medicare Other | Admitting: Physical Therapy

## 2018-10-30 LAB — GLUCOSE, CAPILLARY
GLUCOSE-CAPILLARY: 109 mg/dL — AB (ref 70–99)
Glucose-Capillary: 104 mg/dL — ABNORMAL HIGH (ref 70–99)
Glucose-Capillary: 95 mg/dL (ref 70–99)

## 2018-10-30 NOTE — Progress Notes (Signed)
Physical Therapy Session Note  Patient Details  Name: KONGMENG SANTORO MRN: [id number] Date of Birth: 1923-01-25  Today's Date: 10/30/2018 PT Individual Time: 1040-1105 PT Individual Time Calculation (min): 25 min   Short Term Goals: Week 2:  PT Short Term Goal 1 (Week 2): LTG=STGs due to ELOS  Skilled Therapeutic Interventions/Progress Updates:   Pt in recliner and agreeable to therapy, no c/o pain. Session focused on mobility w/ RW in household environment. Ambulated around unit w/ supervision using RW, 150-250' bouts. Practiced weaving around cones, over obstacles, and in kitchen while getting out tools he would need to make a meal. Verbal and visual cues needed for RW management over obstacles and while in kitchen. Practiced reaching to floor and to higher surfaces to pick up bean bags while using other UE to stabilize on RW, CGA for safety but no overt LOB. Performed multiple sit<>stands from low couch surface in ADL apartment w/ close supervision, stabilized in stance w/ RW. Pt's ability to stabilize after transition from sitting to standing overall is much improved today, no LOB posteriorly. Returned to room and ended session in recliner, all needs in reach.   Therapy Documentation Precautions:  Precautions Precautions: Fall Required Braces or Orthoses: Other Brace/Splint Other Brace/Splint: Pt wears L AFO at baseline.  Restrictions Weight Bearing Restrictions: No Other Position/Activity Restrictions: HOH Pain: Pain Assessment Pain Scale: 0-10 Pain Score: 0-No pain  Therapy/Group: Individual Therapy  Joie Hipps K Charleene Callegari 10/30/2018, 11:14 AM

## 2018-10-30 NOTE — Progress Notes (Signed)
[facility] PHYSICAL MEDICINE & REHABILITATION PROGRESS NOTE   Subjective/Complaints:  No issues overnite slept well, good BM today  ROS  Denies CP, SOB, N/V/D   Objective:   No results found. No results for input(s): WBC, HGB, HCT, PLT in the last 72 hours. No results for input(s): NA, K, CL, CO2, GLUCOSE, BUN, CREATININE, CALCIUM in the last 72 hours.  Intake/Output Summary (Last 24 hours) at 10/30/2018 0923 Last data filed at 10/30/2018 0800 Gross per 24 hour  Intake 840 ml  Output -  Net 840 ml     Physical Exam: Vital Signs Blood pressure 97/62, pulse 86, temperature 98 F (36.7 C), temperature source Oral, resp. rate 18, height 6' (1.829 m), weight 84 kg, SpO2 93 %.   General: No acute distress Mood and affect are appropriate Heart: Regular rate and rhythm no rubs murmurs or extra sounds Lungs: Clear to auscultation, breathing unlabored, no rales or wheezes Abdomen: Positive bowel sounds, soft nontender to palpation, nondistended Extremities: No clubbing, cyanosis, or edema, R hand thenar atrophy Skin: No evidence of breakdown, no evidence of rash Neurologic: Cranial nerves II through XII intact, motor strength is 5/5 in bilateral deltoid, bicep, tricep, 4/5 grip, hip flexor, knee extensors, ankle dorsiflexor and plantar flexor except 2- Left ankle DF and toe ext 2- ankle PF Sensory exam normal sensation to light touch and proprioception in bilateral upper and lower extremities except Left side toes and dorsum of L foot,  Cerebellar exam normal finger to nose to finger as well as heel to shin in bilateral upper and lower extremities Musculoskeletal: Full range of motion in all 4 extremities. No joint swelling Gait no foot drop with Left AFO, Supervision level with cane  Assessment/Plan: 1. Functional deficits secondary to LEFT corona radiata infarct which require 3+ hours per day of interdisciplinary therapy in a comprehensive inpatient rehab setting.  Physiatrist is  providing close team supervision and 24 hour management of active medical problems listed below.  Physiatrist and rehab team continue to assess barriers to discharge/monitor patient progress toward functional and medical goals  Care Tool:  Bathing  Bathing activity did not occur: Refused Body parts bathed by patient: Right arm, Left arm, Chest, Abdomen, Front perineal area, Right upper leg, Left upper leg, Face, Buttocks, Left lower leg, Right lower leg   Body parts bathed by helper: Buttocks, Right lower leg, Left lower leg     Bathing assist Assist Level: Supervision/Verbal cueing     Upper Body Dressing/Undressing Upper body dressing Upper body dressing/undressing activity did not occur (including orthotics): Refused What is the patient wearing?: Pull over shirt    Upper body assist Assist Level: Independent    Lower Body Dressing/Undressing Lower body dressing    Lower body dressing activity did not occur: Refused What is the patient wearing?: Pants, Underwear/pull up, Orthosis     Lower body assist Assist for lower body dressing: Supervision/Verbal cueing     Toileting Toileting    Toileting assist Assist for toileting: Supervision/Verbal cueing     Transfers Chair/bed transfer  Transfers assist     Chair/bed transfer assist level: Supervision/Verbal cueing     Locomotion Ambulation   Ambulation assist      Assist level: Contact Guard/Touching assist Assistive device: Cane-straight Max distance: 200'   Walk 10 feet activity   Assist     Assist level: Supervision/Verbal cueing Assistive device: Cane-straight   Walk 50 feet activity   Assist    Assist level: Contact Guard/Touching assist  Assistive device: Cane-straight    Walk 150 feet activity   Assist    Assist level: Contact Guard/Touching assist Assistive device: Cane-straight    Walk 10 feet on uneven surface  activity   Assist Walk 10 feet on uneven surfaces activity  did not occur: Safety/medical concerns(fatigue)         Wheelchair     Assist Will patient use wheelchair at discharge?: No Type of Wheelchair: Manual    Wheelchair assist level: Supervision/Verbal cueing Max wheelchair distance: 150    Wheelchair 50 feet with 2 turns activity    Assist        Assist Level: Supervision/Verbal cueing   Wheelchair 150 feet activity     Assist Wheelchair 150 feet activity did not occur: Safety/medical concerns(fatigue)   Assist Level: Supervision/Verbal cueing    Medical Problem List and Plan: 1.Decreased functional ability with gait disturbancesecondary toleft corona radiata infarction. Lacunar secondary to small vessel disease cont CIR PT, OT, ELOS 11/13 2. DVT Prophylaxis/Anticoagulation: Subcutaneous heparin. No bleeding episodes 3. Pain Management:Tylenol as needed 4. Mood:Provide emotional support 5. Neuropsych: This patientiscapable of making decisions on hisown behalf. 6. Skin/Wound Care:Routine skin checks 7. Fluids/Electrolytes/Nutrition:Routine in and outs with follow-up chemistries 8.Hypertension. Permissive hypertension. Patient on Tenormin 25 mg daily prior to admission. Resume as needed Controlled on 11/9 on low side-no sx Vitals:   10/29/18 1930 10/30/18 0501  BP: 139/75 97/62  Pulse: 81 86  Resp: 18 18  Temp: 98.1 F (36.7 C) 98 F (36.7 C)  SpO2: 94% 93%  9.Diabetes mellitus with peripheral neuropathy. Hemoglobin A1c 6.5. Glucotrol 5 mg daily. Check blood sugars before meals and at bedtime CBG (last 3)  Recent Labs    10/29/18 1642 10/29/18 2213 10/30/18 0640  GLUCAP 82 104* 104*  controlled on Glipizide 2.5mg  daily 10.CAD with CABG 1997. No chest pain or shortness of breath. Continue aspirin 11.History of non-Hodgkin's lymphoma. Follow-up asoutpatient 12.History of left foot drop. Patient wears an AFO brace since 2016, Fairly worn. May need some  upkeep.Reviewed neuro notes, "sciatica" or compressive neuropathy Contributing to balance problem particularly with uneven surface 13.Hyperlipidemia. Crestor/Lovaza  14.  Hx CTS with bilateral hand weakness  LOS: 11 days A FACE TO FACE EVALUATION WAS PERFORMED  Charlett Blake 10/30/2018, 9:23 AM

## 2018-10-31 LAB — GLUCOSE, CAPILLARY
GLUCOSE-CAPILLARY: 123 mg/dL — AB (ref 70–99)
Glucose-Capillary: 75 mg/dL (ref 70–99)
Glucose-Capillary: 61 mg/dL — ABNORMAL LOW (ref 70–99)
Glucose-Capillary: 76 mg/dL (ref 70–99)
Glucose-Capillary: 76 mg/dL (ref 70–99)

## 2018-10-31 NOTE — Significant Event (Signed)
Hypoglycemic Event  CBG: 61   Treatment: 15 GM carbohydrate snack  Symptoms: None  Follow-up CBG: Time: 12:07 CBG Result:76  Possible Reasons for Event: Unknown      Zachary Spence

## 2018-11-01 ENCOUNTER — Inpatient Hospital Stay (HOSPITAL_COMMUNITY): Payer: Medicare Other

## 2018-11-01 LAB — GLUCOSE, CAPILLARY
GLUCOSE-CAPILLARY: 114 mg/dL — AB (ref 70–99)
GLUCOSE-CAPILLARY: 104 mg/dL — AB (ref 70–99)
Glucose-Capillary: 101 mg/dL — ABNORMAL HIGH (ref 70–99)
Glucose-Capillary: 111 mg/dL — ABNORMAL HIGH (ref 70–99)
Glucose-Capillary: 99 mg/dL (ref 70–99)

## 2018-11-01 MED ORDER — ENOXAPARIN SODIUM 40 MG/0.4ML ~~LOC~~ SOLN
40.0000 mg | SUBCUTANEOUS | Status: DC
  Administered 2018-11-01 – 2018-11-02 (×2): 40 mg via SUBCUTANEOUS
  Filled 2018-11-01 (×2): qty 0.4

## 2018-11-01 NOTE — Progress Notes (Signed)
[place] PHYSICAL MEDICINE & REHABILITATION PROGRESS NOTE   Subjective/Complaints:  Low CBG down to 61 yesterday, good intake overall remains on glucotrol  ROS  Denies CP, SOB, N/V/D   Objective:   No results found. No results for input(s): WBC, HGB, HCT, PLT in the last 72 hours. No results for input(s): NA, K, CL, CO2, GLUCOSE, BUN, CREATININE, CALCIUM in the last 72 hours.  Intake/Output Summary (Last 24 hours) at 11/01/2018 0858 Last data filed at 10/31/2018 1743 Gross per 24 hour  Intake 600 ml  Output -  Net 600 ml     Physical Exam: Vital Signs Blood pressure 111/62, pulse 86, temperature 98.5 F (36.9 C), temperature source Oral, resp. rate 17, height 6' (1.829 m), weight 84 kg, SpO2 90 %.   General: No acute distress Mood and affect are appropriate Heart: Regular rate and rhythm no rubs murmurs or extra sounds Lungs: Clear to auscultation, breathing unlabored, no rales or wheezes Abdomen: Positive bowel sounds, soft nontender to palpation, nondistended Extremities: No clubbing, cyanosis, or edema, R hand thenar atrophy Skin: No evidence of breakdown, no evidence of rash Neurologic: Cranial nerves II through XII intact, motor strength is 5/5 in bilateral deltoid, bicep, tricep, 4/5 grip, hip flexor, knee extensors, ankle dorsiflexor and plantar flexor except 2- Left ankle DF and toe ext 2- ankle PF Sensory exam normal sensation to light touch and proprioception in bilateral upper and lower extremities except Left side toes and dorsum of L foot,  Cerebellar exam normal finger to nose to finger as well as heel to shin in bilateral upper and lower extremities Musculoskeletal: Full range of motion in all 4 extremities. No joint swelling Gait no foot drop with Left AFO, Supervision level with cane  Assessment/Plan: 1. Functional deficits secondary to LEFT corona radiata infarct which require 3+ hours per day of interdisciplinary therapy in a comprehensive inpatient  rehab setting.  Physiatrist is providing close team supervision and 24 hour management of active medical problems listed below.  Physiatrist and rehab team continue to assess barriers to discharge/monitor patient progress toward functional and medical goals  Care Tool:  Bathing  Bathing activity did not occur: Refused Body parts bathed by patient: Right arm, Left arm, Chest, Abdomen, Front perineal area, Right upper leg, Left upper leg, Face, Buttocks, Left lower leg, Right lower leg   Body parts bathed by helper: Buttocks, Right lower leg, Left lower leg     Bathing assist Assist Level: Supervision/Verbal cueing     Upper Body Dressing/Undressing Upper body dressing Upper body dressing/undressing activity did not occur (including orthotics): Refused What is the patient wearing?: Pull over shirt    Upper body assist Assist Level: Independent    Lower Body Dressing/Undressing Lower body dressing    Lower body dressing activity did not occur: Refused What is the patient wearing?: Pants, Underwear/pull up, Orthosis     Lower body assist Assist for lower body dressing: Supervision/Verbal cueing     Toileting Toileting    Toileting assist Assist for toileting: Contact Guard/Touching assist     Transfers Chair/bed transfer  Transfers assist     Chair/bed transfer assist level: Supervision/Verbal cueing     Locomotion Ambulation   Ambulation assist      Assist level: Supervision/Verbal cueing Assistive device: Walker-rolling Max distance: 250'   Walk 10 feet activity   Assist     Assist level: Supervision/Verbal cueing Assistive device: Walker-rolling   Walk 50 feet activity   Assist    Assist  level: Supervision/Verbal cueing Assistive device: Walker-rolling    Walk 150 feet activity   Assist    Assist level: Supervision/Verbal cueing Assistive device: Walker-rolling    Walk 10 feet on uneven surface  activity   Assist Walk 10 feet  on uneven surfaces activity did not occur: Safety/medical concerns(fatigue)         Wheelchair     Assist Will patient use wheelchair at discharge?: No Type of Wheelchair: Manual    Wheelchair assist level: Supervision/Verbal cueing Max wheelchair distance: 150    Wheelchair 50 feet with 2 turns activity    Assist        Assist Level: Supervision/Verbal cueing   Wheelchair 150 feet activity     Assist Wheelchair 150 feet activity did not occur: Safety/medical concerns(fatigue)   Assist Level: Supervision/Verbal cueing    Medical Problem List and Plan: 1.Decreased functional ability with gait disturbancesecondary toleft corona radiata infarction. Lacunar secondary to small vessel disease cont CIR PT, OT, ELOS 11/13 2. DVT Prophylaxis/Anticoagulation: Subcutaneous heparin. May switch to lovenox to reduce needle sticks 3. Pain Management:Tylenol as needed 4. Mood:Provide emotional support 5. Neuropsych: This patientiscapable of making decisions on hisown behalf. 6. Skin/Wound Care:Routine skin checks 7. Fluids/Electrolytes/Nutrition:Routine in and outs with follow-up chemistries 8.Hypertension. Permissive hypertension. Patient on Tenormin 25 mg daily prior to admission. Resume as needed Controlled on 11/9 on low side-no sx Vitals:   10/31/18 2137 11/01/18 0651  BP: 110/76 111/62  Pulse: 70 86  Resp: 15 17  Temp: 98.4 F (36.9 C) 98.5 F (36.9 C)  SpO2: 94% 90%  9.Diabetes mellitus with peripheral neuropathy. Hemoglobin A1c 6.5. Glucotrol 5 mg daily. Check blood sugars before meals and at bedtime CBG (last 3)  Recent Labs    10/31/18 1650 10/31/18 2135 11/01/18 0653  GLUCAP 76 114* 104*  D/C Glipizide 2.5mg  daily 10.CAD with CABG 1997. No chest pain or shortness of breath. Continue aspirin 11.History of non-Hodgkin's lymphoma. Follow-up asoutpatient 12.History of left foot drop. Patient wears an AFO brace  since 2016, Fairly worn. May need some upkeep.Reviewed neuro notes, "sciatica" or compressive neuropathy Contributing to balance problem particularly with uneven surface 13.Hyperlipidemia. Crestor/Lovaza  14.  Hx CTS with bilateral hand weakness  LOS: 13 days A FACE TO FACE EVALUATION WAS PERFORMED  Charlett Blake 11/01/2018, 8:58 AM

## 2018-11-01 NOTE — Progress Notes (Signed)
Physical Therapy Session Note  Patient Details  Name: Zachary Spence MRN: [id number] Date of Birth: [birth year]  Today's Date: 11/01/2018 PT Individual Time: 1000-1100 and 1430-1527 PT Individual Time Calculation (min): 60 min and 57 min    Short Term Goals: Week 2:  PT Short Term Goal 1 (Week 2): LTG=STGs due to ELOS  Skilled Therapeutic Interventions/Progress Updates:   Session 1:  Pt seated in recliner upon PT arrival, agreeable to therapy tx and denies pain. Pt ambulated from room>rehab apartment with RW and supervision, pt's son present this session for family education. Therapist educated pt and family on home modifications for safety, set up at home with RW to ensure safety and minimize fall risk. Pt performed bed mobility on rehab apartment bed with supervision. Pt performed backwards step over threshold with RW and supervision to simulate shower. Pt ambulated to the ortho gym x 150 ft with RW and supervision, performed car transfer with supervision. Pt ambulated to the gym with RW and supervision. Pt and his son asking about when they would be able to go to the basketball games. Given the environment of the basketball stadium without handrails to get up to seats and crowded environment, therapist recommended holding off on this for now until balance improves/until HHPT is completed. Pt did performed steps this session x 12 with handrail and supervision, requires min-mod assist to correct LOB when ascending/descended 4 steps without rails. Pt ambulated x 100 ft with SPC and CGA, therapist educated pt and son that this is only to be done when son is present. Pt worked on community distance ambulation this session, ambulated x 500+ ft throughout hospital with RW and supervision, discussed with son and pt going to the indoor track to work on walking upon d/c but not using other equipment/treadmills. Pt ambulated to gym with RW and supervision, therapist provided handouts for HEP, some exercises  only to be done with supervision from son. Pt ambulated back to room and left seated in recliner with needs in reach and son present.   Session 2: Pt seated in recliner upon PT arrival, agreeable to therapy tx and denies pain. Pt ambulated from room>gym with RW and supervision x 150 ft. Pt worked on standing balance stepping reactions with backwards stepping and side stepping this session, x 1 trial with counting down and x 1 trial at random, min assist for balance. Pt instructed in [place] B exercises this session for home balance program (only to be performed when son is present)- pt performed each exercise this session including: knee bends, backwards wlaking with RW, figure 8 walking with RW, sidestepping with B HHA, tandem stance with RW, single leg stance with RW, and sit<>stands without UE support. Pt worked on dynamic standing balance while ambulating without AD x 100 ft, ambulating backwards without AD min assist and stepping over objects in all direction, pt loses balance when stepping backwards over hockey sticks, mod-max assist to correct LOB. Pt ambulated 500+ ft throughout hospital with RW and supervision working on endurance, ambulated back to room and left seated in recliner with needs in reach and chair alarm set.   Therapy Documentation Precautions:  Precautions Precautions: Fall Required Braces or Orthoses: Other Brace/Splint Other Brace/Splint: Pt wears L AFO at baseline.  Restrictions Weight Bearing Restrictions: No Other Position/Activity Restrictions: HOH    Therapy/Group: Individual Therapy  Netta Corrigan, PT, DPT 11/01/2018, 7:54 AM

## 2018-11-01 NOTE — Progress Notes (Signed)
Occupational Therapy Session Note  Patient Details  Name: Zachary Spence MRN: [id number] Date of Birth: 17-Jul-1923  Today's Date: 11/01/2018 OT Individual Time: 1100-1200 OT Individual Time Calculation (min): 60 min    Short Term Goals: Week 2:  OT Short Term Goal 1 (Week 2): STG=LTG secondary to ELOS  Skilled Therapeutic Interventions/Progress Updates:    OT intervention with focus on family education, functional amb with RW, home safety recommendations, safety awareness, activity tolerance, and BLE therex to increase independence with BADLs.  Pt's son present for education.  Pt amb with RW to ADL apartment and engaged in simulated kitchen activities, reaching/retrieving items from cabinets and refrigerator and transporting items in kitchen.  Recommended that pt not use stove/oven independently.  Pt and son verbalized understanding recommendation.  Discussed home safety and removing area/throw rugs in home. Pt and son verbalized understanding of all recommendations.  Pt amb with RW to Day Room and engaged in BLE therex on NuStep (8 mins level 4 BLE only). Pt returned to room and requested use of toilet prior to returning to recliner.  Pt remained in recliner with all needs within reach and seat alarm activated.   Therapy Documentation Precautions:  Precautions Precautions: Fall Required Braces or Orthoses: Other Brace/Splint Other Brace/Splint: Pt wears L AFO at baseline.  Restrictions Weight Bearing Restrictions: No Other Position/Activity Restrictions: HOH Pain:    Therapy/Group: Individual Therapy  Leroy Libman 11/01/2018, 12:01 PM

## 2018-11-01 NOTE — Progress Notes (Signed)
Occupational Therapy Session Note  Patient Details  Name: Zachary Spence MRN: [id number] Date of Birth: [birth year]  Today's Date: 11/01/2018 OT Individual Time: 1400-1430 OT Individual Time Calculation (min): 30 min    Short Term Goals: Week 2:  OT Short Term Goal 1 (Week 2): STG=LTG secondary to ELOS  Skilled Therapeutic Interventions/Progress Updates:    OT intervention with focus on functional amb with RW, standing balance, functional transfers, activity tolerance, and safety awareness to increase independence with BADLs. Pt amb with RW to bathroom for toileting and tranfser to shower.  Pt completed all bathing/dressing tasks at supervision level.  Pt completed grooming tasks while standing at sink.  Discussed home safety and recommendations.  Pt verbalized understanding.  Pt remained in recliner with all needs within reach.   Therapy Documentation Precautions:  Precautions Precautions: Fall Required Braces or Orthoses: Other Brace/Splint Other Brace/Splint: Pt wears L AFO at baseline.  Restrictions Weight Bearing Restrictions: No Other Position/Activity Restrictions: HOH Pain: Pain Assessment Pain Score: 0-No pain   Therapy/Group: Individual Therapy  Leroy Libman 11/01/2018, 2:37 PM

## 2018-11-02 ENCOUNTER — Inpatient Hospital Stay (HOSPITAL_COMMUNITY): Payer: Self-pay

## 2018-11-02 ENCOUNTER — Inpatient Hospital Stay (HOSPITAL_COMMUNITY): Payer: Self-pay | Admitting: Physical Therapy

## 2018-11-02 ENCOUNTER — Inpatient Hospital Stay (HOSPITAL_COMMUNITY): Payer: Medicare Other

## 2018-11-02 LAB — GLUCOSE, CAPILLARY
GLUCOSE-CAPILLARY: 112 mg/dL — AB (ref 70–99)
Glucose-Capillary: 109 mg/dL — ABNORMAL HIGH (ref 70–99)
Glucose-Capillary: 91 mg/dL (ref 70–99)
Glucose-Capillary: 103 mg/dL — ABNORMAL HIGH (ref 70–99)

## 2018-11-02 NOTE — Discharge Instructions (Signed)
Inpatient Rehab Discharge Instructions  [place] Discharge date and time: No discharge date for patient encounter.   Activities/Precautions/ Functional Status: Activity: activity as tolerated Diet: diabetic diet Wound Care: none needed Functional status:  ___ No restrictions     ___ Walk up steps independently ___ 24/7 supervision/assistance   ___ Walk up steps with assistance ___ Intermittent supervision/assistance  ___ Bathe/dress independently ___ Walk with walker     _x__ Bathe/dress with assistance ___ Walk Independently    ___ Shower independently ___ Walk with assistance    ___ Shower with assistance ___ No alcohol     ___ Return to work/school ________  COMMUNITY REFERRALS UPON DISCHARGE:   Home Health:   PT     OT   Agency:  [facility]:  [phone] Medical Equipment/Items Ordered:  Rolling walker  Agency/Supplier:  [facility]      Phone:  [phone]  GENERAL COMMUNITY RESOURCES FOR PATIENT/FAMILY: Support Groups:  [facility] Stroke Support Group                              Meets the second Thursday of each month from 6 - 7 PM (except June, July, August)                              [facility], [address], [place]                              For more information, call [phone]  Special Instructions: No driving  Continue aspirin and Plavix x3 weeks then Plavix alone STROKE/TIA DISCHARGE INSTRUCTIONS SMOKING Cigarette smoking nearly doubles your risk of having a stroke & is the single most alterable risk factor  If you smoke or have smoked in the last 12 months, you are advised to quit smoking for your health.  Most of the excess cardiovascular risk related to smoking disappears within a year of stopping.  Ask you doctor about anti-smoking medications  [place] Quit Line: 1-800-QUIT NOW  Free Smoking Cessation Classes [phone]  CHOLESTEROL Know your levels;  limit fat & cholesterol in your diet  Lipid Panel     Component Value Date/Time   CHOL 193 10/18/2018 0313   TRIG 112 10/18/2018 0313   HDL 39 (L) 10/18/2018 0313   CHOLHDL 4.9 10/18/2018 0313   VLDL 22 10/18/2018 0313   LDLCALC 132 (H) 10/18/2018 0313      Many patients benefit from treatment even if their cholesterol is at goal.  Goal: Total Cholesterol (CHOL) less than 160  Goal:  Triglycerides (TRIG) less than 150  Goal:  HDL greater than 40  Goal:  LDL (LDLCALC) less than 100   BLOOD PRESSURE American Stroke Association blood pressure target is less that 120/80 mm/Hg  Your discharge blood pressure is:  BP: (!) 98/53  Monitor your blood pressure  Limit your salt and alcohol intake  Many individuals will require more than one medication for high blood pressure  DIABETES (A1c is a blood sugar average for last 3 months) Goal HGBA1c is under 7% (HBGA1c is blood sugar average for last 3 months)  Diabetes:     Lab Results  Component Value Date   HGBA1C 6.5 (H) 10/18/2018     Your HGBA1c can  be lowered with medications, healthy diet, and exercise.  Check your blood sugar as directed by your physician  Call your physician if you experience unexplained or low blood sugars.  PHYSICAL ACTIVITY/REHABILITATION Goal is 30 minutes at least 4 days per week  Activity: Increase activity slowly, Therapies: Physical Therapy: Home Health Return to work:   Activity decreases your risk of heart attack and stroke and makes your heart stronger.  It helps control your weight and blood pressure; helps you relax and can improve your mood.  Participate in a regular exercise program.  Talk with your doctor about the best form of exercise for you (dancing, walking, swimming, cycling).  DIET/WEIGHT Goal is to maintain a healthy weight  Your discharge diet is:  Diet Order            Diet Carb Modified Fluid consistency: Thin; Room service appropriate? Yes  Diet effective now               liquids Your height is:  Height: 6' (182.9 cm) Your current weight is: Weight: 83.7 kg Your Body Mass Index (BMI) is:  BMI (Calculated): 25.02  Following the type of diet specifically designed for you will help prevent another stroke.  Your goal weight range is:    Your goal Body Mass Index (BMI) is 19-24.  Healthy food habits can help reduce 3 risk factors for stroke:  High cholesterol, hypertension, and excess weight.  RESOURCES Stroke/Support Group:  Call [phone]   STROKE EDUCATION PROVIDED/REVIEWED AND GIVEN TO PATIENT Stroke warning signs and symptoms How to activate emergency medical system (call 911). Medications prescribed at discharge. Need for follow-up after discharge. Personal risk factors for stroke. Pneumonia vaccine given:  Flu vaccine given:  My questions have been answered, the writing is legible, and I understand these instructions.  I will adhere to these goals & educational materials that have been provided to me after my discharge from the hospital.      My questions have been answered and I understand these instructions. I will adhere to these goals and the provided educational materials after my discharge from the hospital.  Patient/Caregiver Signature _______________________________ Date __________  Clinician Signature _______________________________________ Date __________  Please bring this form and your medication list with you to all your follow-up doctor's appointments.

## 2018-11-02 NOTE — Progress Notes (Signed)
Physical Therapy Session Note  Patient Details  Name: Zachary Spence MRN: [id number] Date of Birth: [birth year]  Today's Date: 11/02/2018 PT Individual Time: 1005-1030 PT Individual Time Calculation (min): 25 min   Short Term Goals: Week 2:  PT Short Term Goal 1 (Week 2): LTG=STGs due to ELOS  Skilled Therapeutic Interventions/Progress Updates:   Pt in recliner and agreeable to therapy, denies pain. Pt ambulated to/from therapy gym, modified independent w/ RW. Performed Berg Balance Scale as detailed below and explained significance of results to pt including high fall risk and continued need to use a RW upon d/c. Pt verbalized understanding and in agreement. Practiced floor transfer w/ supervision and min verbal cues for technique. Emphasized importance of using external support from couch or chair as pt not safe to stand up w/o external assistance. Returned to room and ended session in recliner, all needs in reach.   Therapy Documentation Precautions:  Precautions Precautions: Fall Required Braces or Orthoses: Other Brace/Splint Other Brace/Splint: Pt wears L AFO at baseline.  Restrictions Weight Bearing Restrictions: No Other Position/Activity Restrictions: HOH Trunk/Postural Assessment : Cervical Assessment Cervical Assessment: Exceptions to WFL(forward head) Thoracic Assessment Thoracic Assessment: Exceptions to WFL(kyphotic) Lumbar Assessment Lumbar Assessment: Exceptions to WFL(posterior pelvic tilt)  Balance: Standardized Balance Assessment Standardized Balance Assessment: Berg Balance Test Berg Balance Test Sit to Stand: Able to stand without using hands and stabilize independently Standing Unsupported: Able to stand 2 minutes with supervision Sitting with Back Unsupported but Feet Supported on Floor or Stool: Able to sit safely and securely 2 minutes Stand to Sit: Sits safely with minimal use of hands Transfers: Able to transfer safely, minor use of hands Standing  Unsupported with Eyes Closed: Able to stand 10 seconds with supervision Standing Ubsupported with Feet Together: Able to place feet together independently and stand for 1 minute with supervision From Standing, Reach Forward with Outstretched Arm: Can reach forward >5 cm safely (2") From Standing Position, Pick up Object from Floor: Able to pick up shoe, needs supervision From Standing Position, Turn to Look Behind Over each Shoulder: Needs supervision when turning Turn 360 Degrees: Needs close supervision or verbal cueing Standing Unsupported, Alternately Place Feet on Step/Stool: Able to complete >2 steps/needs minimal assist Standing Unsupported, One Foot in Front: Needs help to step but can hold 15 seconds Standing on One Leg: Unable to try or needs assist to prevent fall Total Score: 34 Dynamic Sitting Balance Dynamic Sitting - Balance Support: Feet supported Dynamic Sitting - Level of Assistance: 7: Independent Dynamic Standing Balance Dynamic Standing - Level of Assistance: 6: Modified independent (Device/Increase time)  Therapy/Group: Individual Therapy  Ansen Sayegh K Danyla Wattley 11/02/2018, 11:01 AM

## 2018-11-02 NOTE — Progress Notes (Signed)
Occupational Therapy Discharge Summary  Patient Details  Name: Zachary Spence MRN: [id number] Date of Birth: Oct 30, 1923  Patient has met 7 of 14 long term goals due to improved activity tolerance, improved balance, postural control, ability to compensate for deficits, functional use of  LEFT lower extremity, improved attention, improved awareness and improved coordination. Pt made excellent progress with BADLs/IADLs during this admission.  Pt completes all BADLs and simple home mgmt and kitchen tasks at independent with assistive device level.  Pt's son has been present and participated in therapy.  Pt and son verbalize understanding of recommendations. Patient to discharge at overall Modified Independent level.  Patient's care partner is independent to provide the necessary physical assistance at discharge.      Recommendation:  No further OT is recommended at this time.  Equipment: No equipment provided Pt has seat in shower.  Reasons for discharge: treatment goals met  Patient/family agrees with progress made and goals achieved: Yes  OT Discharge Vision Baseline Vision/History: Wears glasses Wears Glasses: At all times Patient Visual Report: No change from baseline Vision Assessment?: No apparent visual deficits Perception  Perception: Within Functional Limits Praxis Praxis: Intact Cognition Overall Cognitive Status: Within Functional Limits for tasks assessed Arousal/Alertness: Awake/alert Orientation Level: Oriented X4 Attention: Selective Selective Attention: Appears intact Memory: Appears intact Awareness: Appears intact Problem Solving: Appears intact Safety/Judgment: Appears intact Sensation Sensation Light Touch: Appears Intact Hot/Cold: Appears Intact Motor  Motor Motor: Within Functional Limits    Trunk/Postural Assessment  Cervical Assessment Cervical Assessment: Exceptions to WFL(forward head) Thoracic Assessment Thoracic Assessment: Exceptions to  WFL(kyphotic) Lumbar Assessment Lumbar Assessment: Exceptions to WFL(posterior pelvic tilt)  Balance Dynamic Sitting Balance Dynamic Sitting - Balance Support: Feet supported Dynamic Sitting - Level of Assistance: 7: Independent Dynamic Standing Balance Dynamic Standing - Level of Assistance: 6: Modified independent (Device/Increase time) Extremity/Trunk Assessment RUE Assessment RUE Assessment: Within Functional Limits LUE Assessment LUE Assessment: Within Functional Limits   Leroy Libman 11/02/2018, 8:14 AM

## 2018-11-02 NOTE — Discharge Summary (Signed)
NAME: Zachary Spence, MCMICHAEL MEDICAL RECORD [id number] ACCOUNT [id number] DATE OF BIRTH:[birth year] FACILITY: [facility] LOCATION: [facility] PHYSICIAN:ANDREW Letta Pate, MD  DISCHARGE SUMMARY  DATE OF DISCHARGE:  11/03/2018  DISCHARGE DIAGNOSES: 1.  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 2.  Subcutaneous Lovenox for deep venous thrombosis prophylaxis. 3.  Hypertension. 4.  Diabetes mellitus. 5.  Peripheral neuropathy. 6.  Coronary artery disease with coronary artery bypass graft. 7.  History of non-Hodgkin's lymphoma. 8.  History of left foot drop. 9.  Hyperlipidemia.  HOSPITAL COURSE:  This is a 84 year old right-handed male with history of CAD with CABG, hypertension, diabetes mellitus as well as non-Hodgkin's lymphoma with left foot drop.  He presented 10/17/2018 with generalized weakness, unstable gait.  He lives  with his son.  Independent with a cane and AFO brace prior to admission.  Cranial CT scan unremarkable.  30 old lacunar infarctions.  The patient did not receive tPA.  MRI showed small focus of acute ischemia within the posterior left corona radiata.   No hemorrhage or mass effect.  CT angiogram of head and neck with no dissection or aneurysm.  Echocardiogram with ejection fraction of 60%.  Systolic function was normal.  Maintained on aspirin and Plavix therapy.  Subcutaneous heparin for DVT  prophylaxis.  Tolerating a regular diet.  Therapies initiated, and the patient was admitted for a comprehensive rehab program.  PAST MEDICAL HISTORY:  See discharge diagnoses.  SOCIAL HISTORY:  Lives with son.  Used a cane and AFO brace prior to admission.  FUNCTIONAL STATUS:  Upon admission to rehab services was moderate assist.  Ambulates with a straight cane, minimal assist sit to stand, minimal assist with activities of daily living.  PHYSICAL EXAMINATION: VITAL SIGNS:  Blood pressure 117/71, pulse 71, temperature 97, respirations 20. GENERAL:  Alert male,  oriented x3. HEENT:  EOMs intact. NECK:  Supple, nontender, no JVD. CARDIOVASCULAR:  Rate controlled. ABDOMEN:  Soft, nontender, good bowel sounds. LUNGS:  Clear to auscultation without wheeze.  REHABILITATION HOSPITAL COURSE:  The patient was admitted to inpatient rehab services.  Therapies initiated on a 3-hour daily basis, consisting of physical therapy, occupational therapy and rehabilitation nursing.  The following issues were addressed  during patient's rehabilitation stay.  Pertaining to the patient's left corona radiata infarction, remained stable.  He continued on aspirin and Plavix therapy.  Would follow up with neurology services.  Blood pressure is controlled, monitored.   Permissive hypertension.  He had been on Tenormin prior to admission.  This could be resumed as needed.  Blood sugar is controlled.  Hemoglobin A1c of 6.5.  No chest pain or shortness of breath noted.  History of coronary artery disease with coronary  artery bypass graft.  He will continue aspirin therapy.  History of non-Hodgkin's lymphoma.  He would follow up outpatient.  The patient received weekly collaborative interdisciplinary team conferences to discuss estimated length of stay, family  teaching, any barriers to discharge.  The patient ambulates from room to rehab apartment, rolling walker supervision.  Family education ongoing.  Ambulates up to 150 feet.  Performed car transfers, supervision.  Again, can ambulate to the gym from his  room.  Straight-point cane, contact guard assist.  Can increase his ambulation up to 500 feet in the community.  Gathered belongings for activities of daily living and homemaking.  It was advised no driving.  Family teaching completed.  DISCHARGE MEDICATIONS:  Aspirin 81 mg p.o. daily, calcium 1250 mg daily, Plavix 75 mg p.o. daily, folic  acid 1 mg p.o. daily, multivitamin 1 p.o. daily, Lovaza 1 g p.o. daily, Crestor 5 mg p.o. daily, Senokot-S 1 tablet p.o. b.i.d., Tylenol as needed,   Glucotrol 5 mg p.o. daily.  The patient could resume Tenormin 25 mg p.o. daily after followup with primary MD with noted permissive hypertension after CVA.  Follow up with Dr. Alysia Penna at the outpatient rehab center as directed; Dr. Antony Contras, neurology service, call for  appointment; Dr[facility] Ginette Pitman of [facility].  The patient will continue on diabetic diet.  LN/NUANCE D:11/02/2018 T:11/02/2018 JOB:[id number]

## 2018-11-02 NOTE — Progress Notes (Signed)
Occupational Therapy Session Note  Patient Details  Name: KALLEN MCCRYSTAL MRN: [id number] Date of Birth: [birth year]  Today's Date: 11/02/2018 OT Individual Time: 1300-1345 OT Individual Time Calculation (min): 45 min    Short Term Goals: Week 2:  OT Short Term Goal 1 (Week 2): STG=LTG secondary to ELOS BADL Skilled Therapeutic Interventions/Progress Updates:    OT intervention with focus on BADL retraining including bathing at shower level, dressing with sit<>stand from chair, toileting, functional amb with RW in room for simple home mgmt tasks, discharge planning, activity tolerance and safety awareness to increase independence with BADLs. Pt amb in room to gather supplies before entering bathroom for shower and dressing. Pt completed grooming tasks standing at sink.  Pt completes all tasks without assistance or unsafe behaviors. Continued education of home safety recommendations.  Pt verbalizes understanding of all recommendations.  Pt pleased with progress and ready for discharge tomorrow.   Therapy Documentation Precautions:  Precautions Precautions: Fall Required Braces or Orthoses: Other Brace/Splint Other Brace/Splint: Pt wears L AFO at baseline.  Restrictions Weight Bearing Restrictions: No Other Position/Activity Restrictions: HOH Pain:  Pt denies pain  Therapy/Group: Individual Therapy  Leroy Libman 11/02/2018, 2:48 PM

## 2018-11-02 NOTE — Discharge Summary (Signed)
Discharge summary job [id number]

## 2018-11-02 NOTE — Progress Notes (Signed)
Physical Therapy Discharge Summary  Patient Details  Name: Zachary Spence MRN: [id number] Date of Birth: 03-Apr-1923  Today's Date: 11/02/2018 PT Individual Time: 1749-4496 PT Individual Time Calculation (min): 62 min    Patient has met 13 of 13 long term goals due to improved activity tolerance, improved balance, improved postural control, increased strength, ability to compensate for deficits and improved awareness.  Patient to discharge at an ambulatory level Modified Independent.   Patient's care partner is independent to provide the necessary supervision assist for community ambulation/access at discharge. Pt is independent at a household level and will have supervision from his son for community access.   All goals met.   Recommendation:  Patient will benefit from ongoing skilled PT services in home health setting to continue to advance safe functional mobility, address ongoing impairments in balance, strength, endurance, ROM and minimize fall risk.  Equipment: RW  Reasons for discharge: treatment goals met  Patient/family agrees with progress made and goals achieved: Yes   PT Treatment Interventions: Pt seated in w/c upon PT arrival, agreeable to therapy tx and denies pain. Pt ambulated from room>gym x 150 ft with RW and Mod I. Pt ambulated to the rehab apartment Mod I with RW. Pt performed bed mobility on rehab aparment Mod I, transferred on/off couch and recliner Mod I with RW. Pt ambulated to the ortho gym x 100 ft mod I and performed car transfer with supervision. Pt ambulated to hallways and ascended/descended 12 steps in stairwell with supervision and single handrail, B UE support on single rail. Pt ambulated back to gym mod I with RW. Pt worked on dynamic standing balance without AD while shooting basketball and picking ball up off floor, min-mod assist at times to help correct LOB when ambulating without RW. Pt worked on dynamic standing balance without AD to perform toe taps  on colored cones x 2 trials with min assist. Pt's son present for second half of the session. Pt ascended/descended 4 steps with B UE support on handrail to show son techniques. Pt ambulated across mulch and ascended/descended curb with RW and supervision assist from son. Pt ambulated to the gym and went through [place] exercise B program with son. Pt ambulated back to room with RW and mod I. Therapist provided pt with gait belt for use during HEP balance exercises. Pt left seated in recliner with needs in reach and son present.   PT Discharge Precautions/Restrictions Precautions Precautions: Fall Required Braces or Orthoses: Other Brace/Splint Other Brace/Splint: Pt wears L AFO at baseline.  Restrictions Weight Bearing Restrictions: No Other Position/Activity Restrictions: HOH Pain  denies pain Cognition Overall Cognitive Status: Within Functional Limits for tasks assessed Arousal/Alertness: Awake/alert Orientation Level: Oriented X4 Attention: Selective Selective Attention: Appears intact Memory: Appears intact Awareness: Appears intact Problem Solving: Appears intact Safety/Judgment: Appears intact Sensation Sensation Light Touch: Appears Intact Peripheral sensation comments: stocking distribution deficits bil feet; fingertips also affected bil Light Touch Impaired Details: Impaired LLE;Impaired RLE Proprioception: Appears Intact Coordination Gross Motor Movements are Fluid and Coordinated: Yes Fine Motor Movements are Fluid and Coordinated: Yes Coordination and Movement Description: coordination WFL Motor  Motor Motor: Within Functional Limits  Mobility Bed Mobility Bed Mobility: Rolling Right;Rolling Left;Left Sidelying to Sit Rolling Right: Independent Rolling Left: Independent Left Sidelying to Sit: Independent Transfers Transfers: Sit to Stand;Stand to Sit;Stand Pivot Transfers Sit to Stand: Independent with assistive device Stand to Sit: Independent with assistive  device Stand Pivot Transfers: Independent with assistive device Transfer (Assistive device): Rolling walker  Locomotion  Gait Ambulation: Yes Gait Assistance: Independent with assistive device Gait Distance (Feet): 200 Feet Assistive device: Rolling walker Gait Gait: Yes Gait Pattern: Impaired Gait Pattern: Step-through pattern;Decreased dorsiflexion - left;Narrow base of support;Decreased trunk rotation Gait velocity: Decreased  Stairs / Additional Locomotion Stairs: Yes Stairs Assistance: Supervision/Verbal cueing Stair Management Technique: Two rails Number of Stairs: 12 Wheelchair Mobility Wheelchair Mobility: No  Trunk/Postural Assessment  Cervical Assessment Cervical Assessment: Exceptions to WFL(forward head posture) Thoracic Assessment Thoracic Assessment: Exceptions to WFL(kyphosis) Lumbar Assessment Lumbar Assessment: Exceptions to WFL(posterior pelvic tilt) Postural Control Postural Control: Within Functional Limits  Balance Standardized Balance Assessment Standardized Balance Assessment: Berg Balance Test Berg Balance Test Sit to Stand: Able to stand without using hands and stabilize independently Standing Unsupported: Able to stand 2 minutes with supervision Sitting with Back Unsupported but Feet Supported on Floor or Stool: Able to sit safely and securely 2 minutes Stand to Sit: Sits safely with minimal use of hands Transfers: Able to transfer safely, minor use of hands Standing Unsupported with Eyes Closed: Able to stand 10 seconds with supervision Standing Ubsupported with Feet Together: Able to place feet together independently and stand for 1 minute with supervision From Standing, Reach Forward with Outstretched Arm: Can reach forward >5 cm safely (2") From Standing Position, Pick up Object from Floor: Able to pick up shoe, needs supervision From Standing Position, Turn to Look Behind Over each Shoulder: Needs supervision when turning Turn 360 Degrees:  Needs close supervision or verbal cueing Standing Unsupported, Alternately Place Feet on Step/Stool: Able to complete >2 steps/needs minimal assist Standing Unsupported, One Foot in Front: Needs help to step but can hold 15 seconds Standing on One Leg: Unable to try or needs assist to prevent fall Total Score: 34 Extremity Assessment  RLE Assessment RLE Assessment: Within Functional Limits Passive Range of Motion (PROM) Comments: tight heel cords and hamstrings General Strength Comments: ankle DF 4/5 LLE Assessment LLE Assessment: Within Functional Limits Passive Range of Motion (PROM) Comments: tight heel cords and hamstrings General Strength Comments: L ankle DF 2-/5    Netta Corrigan, PT, DPT 11/02/2018, 1:03 PM

## 2018-11-02 NOTE — Progress Notes (Signed)
[facility] PHYSICAL MEDICINE & REHABILITATION PROGRESS NOTE   Subjective/Complaints:  No further drops in CBG  ROS  Denies CP, SOB, N/V/D   Objective:   No results found. No results for input(s): WBC, HGB, HCT, PLT in the last 72 hours. No results for input(s): NA, K, CL, CO2, GLUCOSE, BUN, CREATININE, CALCIUM in the last 72 hours.  Intake/Output Summary (Last 24 hours) at 11/02/2018 0831 Last data filed at 11/02/2018 0700 Gross per 24 hour  Intake 600 ml  Output -  Net 600 ml     Physical Exam: Vital Signs Blood pressure 119/68, pulse 86, temperature 97.7 F (36.5 C), resp. rate 18, height 6' (1.829 m), weight 84 kg, SpO2 94 %.   General: No acute distress Mood and affect are appropriate Heart: Regular rate and rhythm no rubs murmurs or extra sounds Lungs: Clear to auscultation, breathing unlabored, no rales or wheezes Abdomen: Positive bowel sounds, soft nontender to palpation, nondistended Extremities: No clubbing, cyanosis, or edema, R hand thenar atrophy Skin: No evidence of breakdown, no evidence of rash Neurologic: Cranial nerves II through XII intact, motor strength is 5/5 in bilateral deltoid, bicep, tricep, 4/5 grip, hip flexor, knee extensors, ankle dorsiflexor and plantar flexor except 2- Left ankle DF and toe ext 2- ankle PF Sensory exam normal sensation to light touch and proprioception in bilateral upper and lower extremities except Left side toes and dorsum of L foot,  Cerebellar exam normal finger to nose to finger as well as heel to shin in bilateral upper and lower extremities Musculoskeletal: Full range of motion in all 4 extremities. No joint swelling Gait no foot drop with Left AFO, Supervision level with cane  Assessment/Plan: 1. Functional deficits secondary to LEFT corona radiata infarct which require 3+ hours per day of interdisciplinary therapy in a comprehensive inpatient rehab setting.  Physiatrist is providing close team supervision and 24  hour management of active medical problems listed below.  Physiatrist and rehab team continue to assess barriers to discharge/monitor patient progress toward functional and medical goals  Care Tool:  Bathing  Bathing activity did not occur: Refused Body parts bathed by patient: Right arm, Left arm, Chest, Abdomen, Front perineal area, Right upper leg, Left upper leg, Face, Buttocks, Left lower leg, Right lower leg   Body parts bathed by helper: Buttocks, Right lower leg, Left lower leg     Bathing assist Assist Level: Independent with assistive device     Upper Body Dressing/Undressing Upper body dressing Upper body dressing/undressing activity did not occur (including orthotics): Refused What is the patient wearing?: Pull over shirt    Upper body assist Assist Level: Independent    Lower Body Dressing/Undressing Lower body dressing    Lower body dressing activity did not occur: Refused What is the patient wearing?: Underwear/pull up, Pants     Lower body assist Assist for lower body dressing: Independent     Toileting Toileting    Toileting assist Assist for toileting: Independent     Transfers Chair/bed transfer  Transfers assist     Chair/bed transfer assist level: Independent with assistive device     Locomotion Ambulation   Ambulation assist      Assist level: Supervision/Verbal cueing Assistive device: Walker-rolling Max distance: 250'   Walk 10 feet activity   Assist     Assist level: Supervision/Verbal cueing Assistive device: Walker-rolling   Walk 50 feet activity   Assist    Assist level: Supervision/Verbal cueing Assistive device: Walker-rolling    Walk  150 feet activity   Assist    Assist level: Supervision/Verbal cueing Assistive device: Walker-rolling    Walk 10 feet on uneven surface  activity   Assist Walk 10 feet on uneven surfaces activity did not occur: Safety/medical concerns(fatigue)          Wheelchair     Assist Will patient use wheelchair at discharge?: No Type of Wheelchair: Manual    Wheelchair assist level: Supervision/Verbal cueing Max wheelchair distance: 150    Wheelchair 50 feet with 2 turns activity    Assist        Assist Level: Supervision/Verbal cueing   Wheelchair 150 feet activity     Assist Wheelchair 150 feet activity did not occur: Safety/medical concerns(fatigue)   Assist Level: Supervision/Verbal cueing    Medical Problem List and Plan: 1.Decreased functional ability with gait disturbancesecondary toleft corona radiata infarction. Lacunar secondary to small vessel disease cont CIR PT, OT, ELOS 11/13 2. DVT Prophylaxis/Anticoagulation: Subcutaneous heparin.  switch to lovenox to reduce needle sticks 3. Pain Management:Tylenol as needed 4. Mood:Provide emotional support 5. Neuropsych: This patientiscapable of making decisions on hisown behalf. 6. Skin/Wound Care:Routine skin checks 7. Fluids/Electrolytes/Nutrition:Routine in and outs with follow-up chemistries 8.Hypertension. Permissive hypertension. Patient on Tenormin 25 mg daily prior to admission. Resume as needed Controlled on 11/9 on low side-no dizziness Vitals:   11/01/18 2206 11/02/18 0456  BP: (!) 94/56 119/68  Pulse: 80 86  Resp: 17 18  Temp: 98.2 F (36.8 C) 97.7 F (36.5 C)  SpO2: 94% 94%  9.Diabetes mellitus with peripheral neuropathy. Hemoglobin A1c 6.5. Glucotrol 5 mg daily. Check blood sugars before meals and at bedtime CBG (last 3)  Recent Labs    11/01/18 1633 11/01/18 2046 11/02/18 0651  GLUCAP 111* 99 109*  CBG normal off Glipizide  10.CAD with CABG 1997. No chest pain or shortness of breath. Continue aspirin 11.History of non-Hodgkin's lymphoma. Follow-up asoutpatient 12.History of left foot drop. Patient wears an AFO brace since 2016, Fairly worn. May need some upkeep.Reviewed neuro notes, "sciatica"  or compressive neuropathy Contributing to balance problem particularly with uneven surface 13.Hyperlipidemia. Crestor/Lovaza  14.  Hx CTS with bilateral hand weakness  LOS: 14 days A FACE TO FACE EVALUATION WAS PERFORMED  Zachary Spence 11/02/2018, 8:31 AM

## 2018-11-02 NOTE — Progress Notes (Signed)
Occupational Therapy Session Note  Patient Details  Name: Zachary Spence MRN: [id number] Date of Birth: 12-06-1923  Today's Date: 11/02/2018 OT Individual Time: 3754-3606 OT Individual Time Calculation (min): 72 min    Short Term Goals: Week 2:  OT Short Term Goal 1 (Week 2): STG=LTG secondary to ELOS  Skilled Therapeutic Interventions/Progress Updates:    OT intervention with focus on BADL retraining including toileting, dressing tasks, grooming while standing at sink, standing activities, home mgmt tasks, safety awareness, and BLE therex to increase independence with BADLs. Pt resting in bed upon arrival and agreeable to therapy. Pt amb in room with RW to gather clothing before amb into bathroom for toileting and dressing.  Pt completed tasks and returned to room for grooming tasks while standing at sink.  Pt completed all tasks without assistance and no safety concerns.  Pt amb with RW to ADL apartment and engaged in simple home mgmt tasks; no LOB or safety issues. Pt returned to Day Room and engaged in BLE therex on NuStep (8 mins at work load 4). Pt returned to room and remained in recliner with all needs within reach.   Therapy Documentation Precautions:  Precautions Precautions: Fall Required Braces or Orthoses: Other Brace/Splint Other Brace/Splint: Pt wears L AFO at baseline.  Restrictions Weight Bearing Restrictions: No Other Position/Activity Restrictions: HOH Pain:  Pt denies pain   Therapy/Group: Individual Therapy  Leroy Libman 11/02/2018, 8:13 AM

## 2018-11-03 LAB — GLUCOSE, CAPILLARY: GLUCOSE-CAPILLARY: 100 mg/dL — AB (ref 70–99)

## 2018-11-03 MED ORDER — ROSUVASTATIN CALCIUM 5 MG PO TABS: 5.0000 mg | ORAL_TABLET | Freq: Every day | ORAL | 0 refills | Status: DC

## 2018-11-03 MED ORDER — CALCIUM CARBONATE 1250 (500 CA) MG PO TABS: 1250.0000 mg | ORAL_TABLET | Freq: Every day | ORAL | 1 refills | Status: AC

## 2018-11-03 MED ORDER — ACETAMINOPHEN 325 MG PO TABS: 650.0000 mg | ORAL_TABLET | ORAL | Status: AC | PRN

## 2018-11-03 MED ORDER — CLOPIDOGREL BISULFATE 75 MG PO TABS: 75.0000 mg | ORAL_TABLET | Freq: Every day | ORAL | 1 refills | Status: AC

## 2018-11-03 MED ORDER — GLIPIZIDE 5 MG PO TABS: 5.0000 mg | ORAL_TABLET | Freq: Every day | ORAL | 1 refills | Status: AC

## 2018-11-03 MED ORDER — FOLIC ACID 1 MG PO TABS: 1.0000 mg | ORAL_TABLET | Freq: Every day | ORAL | 1 refills | Status: AC

## 2018-11-03 NOTE — Progress Notes (Signed)
Patient discharged to home with son with all belongings at 60.  Patient and son verbalized understanding of discharge instructions given and reviewed by D. Chester, [place]. Continue with plan of care.  Mliss Sax

## 2018-11-03 NOTE — Progress Notes (Signed)
Social Work Discharge Note  The overall goal for the admission was met for:   Discharge location: Yes - home with son  Length of Stay: Yes - 15 days  Discharge activity level: Yes - independent with assistive device  Home/community participation: Yes  Services provided included: MD, RD, PT, OT, RN, Pharmacy and SW  Financial Services: Medicare and Private Insurance: [employer]  Follow-up services arranged: Home Health: PT/OT from [facility], DME: rolling walker and Patient/Family has no preference for HH/DME agencies  Comments (or additional information): Pt progressed well and is independent with assistive device and will be safe while son is at work.  Pt cannot drive and son is trying to fine a way for pt to continue his routine in the community.  Pt has good support from son.  Stroke support group information given.  Patient/Family verbalized understanding of follow-up arrangements: Yes  Individual responsible for coordination of the follow-up plan: pt and his son  Confirmed correct DME delivered: Trey Sailors 11/03/2018    Emeric Novinger, Silvestre Mesi

## 2018-11-04 ENCOUNTER — Telehealth: Payer: Self-pay | Admitting: Registered Nurse

## 2018-11-04 NOTE — Telephone Encounter (Signed)
Placed a Call to [name] and Zachary Spence, no answer. Left message to return the call. 1st attempt.

## 2018-11-04 NOTE — Telephone Encounter (Signed)
Transitional Care Call Completed, Appointment Confirmed, Address confirmed,  New patient packet sent to confirmed address  Transitional Care Questions   Questions for our staff to ask patients on Transitional care 48 hour phone call:   1. Are you/is patient experiencing any problems since coming home? No  Are there any questions regarding any aspect of care? No  2. Are there any questions regarding medications administration/dosing? No Are meds being taken as prescribed? Yes Patient should review meds with caller to confirm   3. Have there been any falls? No  4. Has Home Health been to the house and/or have they contacted you? No  If not, have you tried to contact them? Can we help you contact them?   5. Are bowels and bladder emptying properly? Bowels moving a little slow, no problems with bladder  Are there any unexpected incontinence issues? If applicable, is patient following bowel/bladder programs?   6. Any fevers, problems with breathing, unexpected pain? No  7. Are there any skin problems or new areas of breakdown? Scrape on the elbow  8. Has the patient/family member arranged specialty MD follow up (ie cardiology/neurology/renal/surgical/etc)? They are working on it  Can we help arrange? No  9. Does the patient need any other services or support that we can help arrange? No  10. Are caregivers following through as expected in assisting the patient? yes  11. Has the patient quit smoking, drinking alcohol, or using drugs as recommended? Patient states he does not do any of those things

## 2018-11-04 NOTE — Telephone Encounter (Signed)
Patients father [name], called back from [facility] call, requested she call back to [phone]

## 2018-11-15 ENCOUNTER — Encounter: Payer: Self-pay | Admitting: Registered Nurse

## 2018-11-15 ENCOUNTER — Encounter: Payer: Medicare Other | Attending: Registered Nurse | Admitting: Registered Nurse

## 2018-11-15 VITALS — BP 117/70 | HR 82 | Ht 72.0 in | Wt 180.0 lb

## 2018-11-15 DIAGNOSIS — E119 Type 2 diabetes mellitus without complications: Secondary | ICD-10-CM | POA: Diagnosis not present

## 2018-11-15 DIAGNOSIS — E7849 Other hyperlipidemia: Secondary | ICD-10-CM | POA: Diagnosis not present

## 2018-11-15 DIAGNOSIS — I639 Cerebral infarction, unspecified: Secondary | ICD-10-CM

## 2018-11-15 DIAGNOSIS — Z951 Presence of aortocoronary bypass graft: Secondary | ICD-10-CM | POA: Insufficient documentation

## 2018-11-15 DIAGNOSIS — Z9889 Other specified postprocedural states: Secondary | ICD-10-CM | POA: Insufficient documentation

## 2018-11-15 DIAGNOSIS — Z8673 Personal history of transient ischemic attack (TIA), and cerebral infarction without residual deficits: Secondary | ICD-10-CM | POA: Diagnosis not present

## 2018-11-15 DIAGNOSIS — E785 Hyperlipidemia, unspecified: Secondary | ICD-10-CM | POA: Diagnosis not present

## 2018-11-15 DIAGNOSIS — Z87891 Personal history of nicotine dependence: Secondary | ICD-10-CM | POA: Diagnosis not present

## 2018-11-15 DIAGNOSIS — I739 Peripheral vascular disease, unspecified: Secondary | ICD-10-CM | POA: Insufficient documentation

## 2018-11-15 DIAGNOSIS — Z9081 Acquired absence of spleen: Secondary | ICD-10-CM | POA: Insufficient documentation

## 2018-11-15 DIAGNOSIS — R262 Difficulty in walking, not elsewhere classified: Secondary | ICD-10-CM | POA: Insufficient documentation

## 2018-11-15 DIAGNOSIS — I69398 Other sequelae of cerebral infarction: Secondary | ICD-10-CM | POA: Diagnosis not present

## 2018-11-15 DIAGNOSIS — R269 Unspecified abnormalities of gait and mobility: Secondary | ICD-10-CM

## 2018-11-15 DIAGNOSIS — I10 Essential (primary) hypertension: Secondary | ICD-10-CM | POA: Insufficient documentation

## 2018-11-15 DIAGNOSIS — I6782 Cerebral ischemia: Secondary | ICD-10-CM | POA: Diagnosis present

## 2018-11-15 NOTE — Progress Notes (Signed)
Subjective:    Patient ID: Zachary Spence, male    DOB: 09/05/1923, 84 y.o.   MRN: [id number]  HPI: Zachary Spence is a 84 y.o. male who is here for Transitional Care visit in follow up of his acute ischemic stroke secondary to small vessel disease, gait disturbance- post- stroke, hyperlipidemia and type 2 DM. He has been home receiving [place] from [facility]. He denies pain. Also reports good appetite.  Son in room.   Pain Inventory Average Pain 0 Pain Right Now 0 My pain is na  In the last 24 hours, has pain interfered with the following? General activity 0 Relation with others 0 Enjoyment of life 0 What TIME of day is your pain at its worst? na Sleep (in general) Good  Pain is worse with: na Pain improves with: na Relief from Meds: na  Mobility walk without assistance use a walker ability to climb steps?  yes do you drive?  yes  Function retired  Neuro/Psych weakness trouble walking  Prior Studies Any changes since last visit?  no  Physicians involved in your care Any changes since last visit?  no   No family history on file. Social History   Socioeconomic History  . Marital status: Widowed    Spouse name: Not on file  . Number of children: Not on file  . Years of education: Not on file  . Highest education level: Not on file  Occupational History  . Not on file  Social Needs  . Financial resource strain: Not on file  . Food insecurity:    Worry: Not on file    Inability: Not on file  . Transportation needs:    Medical: Not on file    Non-medical: Not on file  Tobacco Use  . Smoking status: Former Research scientist (life sciences)  . Smokeless tobacco: Never Used  Substance and Sexual Activity  . Alcohol use: No    Alcohol/week: 0.0 standard drinks  . Drug use: Never  . Sexual activity: Not on file  Lifestyle  . Physical activity:    Days per week: Not on file    Minutes per session: Not on file  . Stress: Not on file  Relationships  .  Social connections:    Talks on phone: Not on file    Gets together: Not on file    Attends religious service: Not on file    Active member of club or organization: Not on file    Attends meetings of clubs or organizations: Not on file    Relationship status: Not on file  Other Topics Concern  . Not on file  Social History Narrative  . Not on file   Past Surgical History:  Procedure Laterality Date  . APPENDECTOMY    . CORONARY ARTERY BYPASS GRAFT    . SPLENECTOMY     Past Medical History:  Diagnosis Date  . Bowel obstruction ([place])   . Diabetes mellitus without complication ([place])   . Hypertension   . Non Hodgkin's lymphoma ([facility])    BP 117/70   Pulse 82   Ht 6' (1.829 m)   Wt 180 lb (81.6 kg)   SpO2 94%   BMI 24.41 kg/m   Opioid Risk Score:   Fall Risk Score:  `1  Depression screen PHQ 2/9  No flowsheet data found.   Review of Systems  Constitutional: Negative.   HENT: Negative.   Eyes: Negative.   Respiratory: Negative.   Cardiovascular: Negative.  Gastrointestinal: Negative.   Endocrine: Negative.   Genitourinary: Negative.   Musculoskeletal: Positive for gait problem.  Skin: Negative.   Allergic/Immunologic: Negative.   Neurological: Positive for weakness.  Hematological: Negative.   Psychiatric/Behavioral: Negative.   All other systems reviewed and are negative.      Objective:   Physical Exam  Constitutional: He is oriented to person, place, and time. He appears well-developed and well-nourished.  HENT:  Head: Normocephalic and atraumatic.  Neck: Normal range of motion. Neck supple.  Cardiovascular: Normal rate and regular rhythm.  Pulmonary/Chest: Breath sounds normal.  Musculoskeletal:  Normal Muscle Bulk and Muscle Testing Reveals: Upper Extremities: Full ROM and Muscle Strength 4/5 Lower Extremities: Full ROM and Muscle Strength on the Right 5/5 and Left 4/5 Wearing Left AFO.  Arrived from Table with Ease using walker for  support Normal Based Gait   Neurological: He is alert and oriented to person, place, and time.  Skin: Skin is warm and dry.  Psychiatric: He has a normal mood and affect. His behavior is normal.  Nursing note and vitals reviewed.         Assessment & Plan:  1. Acute Ischemic Stroke : Left corona Radiata infarction secondary to small vessel disease: Continue with Home Health Therapy. Has a F/U appointment with Neurology. Continue Plavix.  2. Gait Disturbance post stroke: [place]. 3. Hyperlipidemia: Continue current medication regimen. PCP Following.  4. Type2 DM : Continue current medication regimen. Continue to Monitor.   30 minutes of face to face patient care time was spent during this visit. All questions was encouraged and answered.   F/U in 4-6 weeks with Dr Letta Pate

## 2018-12-07 ENCOUNTER — Encounter: Payer: Self-pay | Admitting: Neurology

## 2018-12-07 ENCOUNTER — Ambulatory Visit (INDEPENDENT_AMBULATORY_CARE_PROVIDER_SITE_OTHER): Payer: Medicare Other | Admitting: Neurology

## 2018-12-07 VITALS — BP 117/68 | HR 84 | Ht 72.0 in | Wt 182.0 lb

## 2018-12-07 DIAGNOSIS — I6381 Other cerebral infarction due to occlusion or stenosis of small artery: Secondary | ICD-10-CM | POA: Diagnosis not present

## 2018-12-07 NOTE — Progress Notes (Signed)
[facility] [address] [place]. [place] [address] [phone]       OFFICE FOLLOW-UP NOTE  [name] Date of Birth:  02-14-1923 Medical Record Number:  [id number]   HPI: Zachary Spence is a pleasant 84 year old Caucasian male seen today for initial office visit following hospital admission for stroke in October 2019.  He is accompanied by his son and history is obtained from them and review of electronic medical records.  I have personally reviewed imaging films in PACS.  He is a 84 year old male with past medical history of hypertension, non-Hodgkin's lymphoma who presented with sudden onset of gait difficulties and being wobbly on his feet and dizzy.  He does have baseline left foot drop and uses an AFO to walk and uses normally a cane.  The son took him to urgent care where they referred him to [facility] for evaluation.  He presented beyond time window for TPA.  CT scan of the brain was unremarkable but MRI scan of the brain showed a small left corona radiata lacunar infarct.  CT angiogram of the head and neck showed no large vessel stenosis or occlusion. but showed a small parotid nodule which had slightly increased in size compared to 2016.  LDL cholesterol was elevated at 132 mg percent.  Hemoglobin A1c was 6.5.  Transthoracic echo showed normal ejection fraction without cardiac source of embolism.  Patient was seen by physical occupational therapy and transferred to inpatient rehab.  He was placed on dual antiplatelet therapy of aspirin and Plavix.  He has done well in rehab and is currently at home living with his son.  He started home physical and occupational therapy few weeks ago.  He is able to walk with a walker with left foot drop and ankle foot brace.  He is tolerating aspirin and Plavix with minor bruising.  In fact he scraped his forearm this morning and wait to my office and has abrasion on his left elbow.  He states his sugars are well controlled.  Is  tolerating Crestor without muscle aches or pains.  He has not had any follow-up lipid profile checked.  ROS:   14 system review of systems is positive for ringing in the ears, runny nose, weakness, difficulty swallowing, murmur and all other systems negative PMH:  Past Medical History:  Diagnosis Date  . Bowel obstruction ([place])   . Diabetes mellitus without complication ([place])   . Hypertension   . Non Hodgkin's lymphoma ([place])   . Stroke [facility])     Social History:  Social History   Socioeconomic History  . Marital status: Widowed    Spouse name: Not on file  . Number of children: Not on file  . Years of education: Not on file  . Highest education level: Not on file  Occupational History  . Not on file  Social Needs  . Financial resource strain: Not on file  . Food insecurity:    Worry: Not on file    Inability: Not on file  . Transportation needs:    Medical: Not on file    Non-medical: Not on file  Tobacco Use  . Smoking status: Former Research scientist (life sciences)  . Smokeless tobacco: Never Used  Substance and Sexual Activity  . Alcohol use: No    Alcohol/week: 0.0 standard drinks  . Drug use: Never  . Sexual activity: Not on file  Lifestyle  . Physical activity:    Days per week: Not on file    Minutes per  session: Not on file  . Stress: Not on file  Relationships  . Social connections:    Talks on phone: Not on file    Gets together: Not on file    Attends religious service: Not on file    Active member of club or organization: Not on file    Attends meetings of clubs or organizations: Not on file    Relationship status: Not on file  . Intimate partner violence:    Fear of current or ex partner: Not on file    Emotionally abused: Not on file    Physically abused: Not on file    Forced sexual activity: Not on file  Other Topics Concern  . Not on file  Social History Narrative  . Not on file    Medications:   Current Outpatient Medications on File Prior to Visit    Medication Sig Dispense Refill  . acetaminophen (TYLENOL) 325 MG tablet Take 2 tablets (650 mg total) by mouth every 4 (four) hours as needed for mild pain (or temp > 37.5 C (99.5 F)).    . ASPIRIN 81 PO Take by mouth.    . calcium carbonate (OS-CAL - DOSED IN MG OF ELEMENTAL CALCIUM) 1250 (500 Ca) MG tablet Take 1 tablet (1,250 mg total) by mouth daily with breakfast. 30 tablet 1  . folic acid (FOLVITE) 1 MG tablet Take 1 tablet (1 mg total) by mouth daily. 30 tablet 1  . glipiZIDE (GLUCOTROL) 5 MG tablet Take 1 tablet (5 mg total) by mouth daily. 30 tablet 1  . Multiple Vitamin (MULTIVITAMIN WITH MINERALS) TABS tablet Take 1 tablet by mouth daily.    . Multiple Vitamins-Minerals (PRESERVISION AREDS 2 PO) Take 1 capsule by mouth daily.     . Omega-3 Fatty Acids (FISH OIL) 1000 MG CAPS Take 1,000 mg by mouth daily.     . rosuvastatin (CRESTOR) 5 MG tablet Take 1 tablet (5 mg total) by mouth daily at 6 PM. 30 tablet 0   No current facility-administered medications on file prior to visit.     Allergies:   Allergies  Allergen Reactions  . Erythromycin Swelling    Swelling of hands    Physical Exam General: well developed, well nourished, seated, in no evident distress Head: head normocephalic and atraumatic.  Neck: supple with no carotid or supraclavicular bruits Cardiovascular: regular rate and rhythm, ejection systolic murmur heard all over the precordium. Musculoskeletal: no deformity Skin:  no rash/petichiae.  Small abrasion over the left elbow. Vascular:  Normal pulses all extremities Vitals:   12/07/18 1245  BP: 117/68  Pulse: 84   Neurologic Exam Mental Status: Awake and fully alert. Oriented to place and time. Recent and remote memory slightly diminished. Attention span, concentration and fund of knowledge appropriate. Mood and affect appropriate.  Cranial Nerves: Fundoscopic exam reveals sharp disc margins. Pupils equal, briskly reactive to light. Extraocular movements  full without nystagmus. Visual fields full to confrontation. Hearing diminished bilaterally. Facial sensation intact. Face, tongue, palate moves normally and symmetrically.  Motor: Normal bulk and tone. Normal strength in all tested extremity muscles except left foot drop which is chronic in mild bilateral ankle dorsiflexor weakness left greater than right.  Diminished fine finger movements on the left.  Minimal weakness of left grip.  Orbits right over left upper extremity.. Sensory.: intact to touch ,pinprick .position and vibratory sensation.  Coordination: Rapid alternating movements normal in all extremities. Finger-to-nose and heel-to-shin performed accurately bilaterally. Gait and Station: Arises from  chair without difficulty. Stance is normal.  He uses a walker and has a left foot brace and drags left foot. Reflexes: 1+ and symmetric. Toes downgoing.   NIHSS  0 Modified Rankin  2  ASSESSMENT: 84 year old Caucasian male with left brain subcortical lacunar infarct in October 2019 from which she is doing very well with excellent recovery.  Vascular risk factors of diabetes, hyperlipidemia and age     PLAN: I had a long d/w patient and his son about his recent lacunar  stroke, risk for recurrent stroke/TIAs, personally independently reviewed imaging studies and stroke evaluation results and answered questions.Continue aspirin 81 mg daily  for secondary stroke prevention and stop clopidogrel and maintain strict control of hypertension with blood pressure goal below 130/90, diabetes with hemoglobin A1c goal below 6.5% and lipids with LDL cholesterol goal below 70 mg/dL. I also advised the patient to eat a healthy diet with plenty of whole grains, cereals, fruits and vegetables, exercise regularly and maintain ideal body weight . Check f/u lipid profile.Followup in the future with my nurse practitioner Janett Billow  in  3 months or call earlier if necessary Greater than 50% of time during this 25 minute  visit was spent on counseling,explanation of diagnosis, planning of further management, discussion with patient and family and coordination of care Antony Contras, MD  [facility] [address] [place], [place] [address]  Phone [phone] Fax [phone] Note: This document was prepared with digital dictation and possible smart phrase technology. Any transcriptional errors that result from this process are unintentional

## 2018-12-07 NOTE — Patient Instructions (Signed)
I had a long d/w patient and his son about his recent lacunar  stroke, risk for recurrent stroke/TIAs, personally independently reviewed imaging studies and stroke evaluation results and answered questions.Continue aspirin 81 mg daily  for secondary stroke prevention and stop clopidogrel and maintain strict control of hypertension with blood pressure goal below 130/90, diabetes with hemoglobin A1c goal below 6.5% and lipids with LDL cholesterol goal below 70 mg/dL. I also advised the patient to eat a healthy diet with plenty of whole grains, cereals, fruits and vegetables, exercise regularly and maintain ideal body weight . Check f/u lipid profile.Followup in the future with my nurse practitioner Janett Billow  in  3 months or call earlier if necessary   Stroke Prevention Some medical conditions and behaviors are associated with a higher chance of having a stroke. You can help prevent a stroke by making nutrition, lifestyle, and other changes, including managing any medical conditions you may have. What nutrition changes can be made?  Eat healthy foods. You can do this by: ? Choosing foods high in fiber, such as fresh fruits and vegetables and whole grains. ? Eating at least 5 or more servings of fruits and vegetables a day. Try to fill half of your plate at each meal with fruits and vegetables. ? Choosing lean protein foods, such as lean cuts of meat, poultry without skin, fish, tofu, beans, and nuts. ? Eating low-fat dairy products. ? Avoiding foods that are high in salt (sodium). This can help lower blood pressure. ? Avoiding foods that have saturated fat, trans fat, and cholesterol. This can help prevent high cholesterol. ? Avoiding processed and premade foods.  Follow your health care provider's specific guidelines for losing weight, controlling high blood pressure (hypertension), lowering high cholesterol, and managing diabetes. These may include: ? Reducing your daily calorie intake. ? Limiting  your daily sodium intake to 1,500 milligrams (mg). ? Using only healthy fats for cooking, such as olive oil, canola oil, or sunflower oil. ? Counting your daily carbohydrate intake. What lifestyle changes can be made?  Maintain a healthy weight. Talk to your health care provider about your ideal weight.  Get at least 30 minutes of moderate physical activity at least 5 days a week. Moderate activity includes brisk walking, biking, and swimming.  Do not use any products that contain nicotine or tobacco, such as cigarettes and e-cigarettes. If you need help quitting, ask your health care provider. It may also be helpful to avoid exposure to secondhand smoke.  Limit alcohol intake to no more than 1 drink a day for nonpregnant women and 2 drinks a day for men. One drink equals 12 oz of beer, 5 oz of wine, or 1 oz of hard liquor.  Stop any illegal drug use.  Avoid taking birth control pills. Talk to your health care provider about the risks of taking birth control pills if: ? You are over 3 years old. ? You smoke. ? You get migraines. ? You have ever had a blood clot. What other changes can be made?  Manage your cholesterol levels. ? Eating a healthy diet is important for preventing high cholesterol. If cholesterol cannot be managed through diet alone, you may also need to take medicines. ? Take any prescribed medicines to control your cholesterol as told by your health care provider.  Manage your diabetes. ? Eating a healthy diet and exercising regularly are important parts of managing your blood sugar. If your blood sugar cannot be managed through diet and exercise, you may  need to take medicines. ? Take any prescribed medicines to control your diabetes as told by your health care provider.  Control your hypertension. ? To reduce your risk of stroke, try to keep your blood pressure below 130/80. ? Eating a healthy diet and exercising regularly are an important part of controlling your  blood pressure. If your blood pressure cannot be managed through diet and exercise, you may need to take medicines. ? Take any prescribed medicines to control hypertension as told by your health care provider. ? Ask your health care provider if you should monitor your blood pressure at home. ? Have your blood pressure checked every year, even if your blood pressure is normal. Blood pressure increases with age and some medical conditions.  Get evaluated for sleep disorders (sleep apnea). Talk to your health care provider about getting a sleep evaluation if you snore a lot or have excessive sleepiness.  Take over-the-counter and prescription medicines only as told by your health care provider. Aspirin or blood thinners (antiplatelets or anticoagulants) may be recommended to reduce your risk of forming blood clots that can lead to stroke.  Make sure that any other medical conditions you have, such as atrial fibrillation or atherosclerosis, are managed. What are the warning signs of a stroke? The warning signs of a stroke can be easily remembered as BEFAST.  B is for balance. Signs include: ? Dizziness. ? Loss of balance or coordination. ? Sudden trouble walking.  E is for eyes. Signs include: ? A sudden change in vision. ? Trouble seeing.  F is for face. Signs include: ? Sudden weakness or numbness of the face. ? The face or eyelid drooping to one side.  A is for arms. Signs include: ? Sudden weakness or numbness of the arm, usually on one side of the body.  S is for speech. Signs include: ? Trouble speaking (aphasia). ? Trouble understanding.  T is for time. ? These symptoms may represent a serious problem that is an emergency. Do not wait to see if the symptoms will go away. Get medical help right away. Call your local emergency services (911 in the [place] Do not drive yourself to the hospital.  Other signs of stroke may include: ? A sudden, severe headache with no known  cause. ? Nausea or vomiting. ? Seizure.  Where to find more information: For more information, visit:  American Stroke Association: www.strokeassociation.org  National Stroke Association: www.stroke.org  Summary  You can prevent a stroke by eating healthy, exercising, not smoking, limiting alcohol intake, and managing any medical conditions you may have.  Do not use any products that contain nicotine or tobacco, such as cigarettes and e-cigarettes. If you need help quitting, ask your health care provider. It may also be helpful to avoid exposure to secondhand smoke.  Remember BEFAST for warning signs of stroke. Get help right away if you or a loved one has any of these signs. This information is not intended to replace advice given to you by your health care provider. Make sure you discuss any questions you have with your health care provider. Document Released: 01/15/2005 Document Revised: 01/13/2017 Document Reviewed: 01/13/2017 Elsevier Interactive Patient Education  [employer].

## 2018-12-08 ENCOUNTER — Telehealth: Payer: Self-pay

## 2018-12-08 LAB — LIPID PANEL
CHOLESTEROL TOTAL: 160 mg/dL (ref 100–199)
Chol/HDL Ratio: 3.8 ratio (ref 0.0–5.0)
HDL: 42 mg/dL (ref >39)
LDL CALC: 79 mg/dL (ref 0–99)
TRIGLYCERIDES: 195 mg/dL — AB (ref 0–149)
VLDL CHOLESTEROL CAL: 39 mg/dL (ref 5–40)

## 2018-12-08 NOTE — Telephone Encounter (Signed)
-----   Message from Garvin Fila, MD sent at 12/08/2018 10:47 AM EST ----- Kindly inform the patient that lipid profile appears satisfactory except triglycerides slightly high.  Advised him to see primary care physician to discuss treatment for this.

## 2018-12-08 NOTE — Telephone Encounter (Signed)
I called pt and advised him of his lab results and Dr. Clydene Fake recommendations. Pt verbalized understanding of results. Pt had no questions at this time but was encouraged to call back if questions arise.

## 2018-12-24 ENCOUNTER — Encounter: Payer: Self-pay | Admitting: Physical Medicine & Rehabilitation

## 2018-12-24 ENCOUNTER — Encounter: Payer: Medicare Other | Attending: Registered Nurse

## 2018-12-24 ENCOUNTER — Ambulatory Visit (HOSPITAL_BASED_OUTPATIENT_CLINIC_OR_DEPARTMENT_OTHER): Payer: Medicare Other | Admitting: Physical Medicine & Rehabilitation

## 2018-12-24 VITALS — BP 132/70 | HR 83 | Resp 14 | Ht 72.0 in | Wt 184.0 lb

## 2018-12-24 DIAGNOSIS — Z9081 Acquired absence of spleen: Secondary | ICD-10-CM | POA: Diagnosis not present

## 2018-12-24 DIAGNOSIS — Z9889 Other specified postprocedural states: Secondary | ICD-10-CM | POA: Diagnosis not present

## 2018-12-24 DIAGNOSIS — I69398 Other sequelae of cerebral infarction: Secondary | ICD-10-CM | POA: Diagnosis not present

## 2018-12-24 DIAGNOSIS — R269 Unspecified abnormalities of gait and mobility: Secondary | ICD-10-CM | POA: Diagnosis not present

## 2018-12-24 DIAGNOSIS — I739 Peripheral vascular disease, unspecified: Secondary | ICD-10-CM | POA: Insufficient documentation

## 2018-12-24 DIAGNOSIS — Z8673 Personal history of transient ischemic attack (TIA), and cerebral infarction without residual deficits: Secondary | ICD-10-CM | POA: Diagnosis not present

## 2018-12-24 DIAGNOSIS — Z951 Presence of aortocoronary bypass graft: Secondary | ICD-10-CM | POA: Insufficient documentation

## 2018-12-24 DIAGNOSIS — I10 Essential (primary) hypertension: Secondary | ICD-10-CM | POA: Diagnosis not present

## 2018-12-24 DIAGNOSIS — E785 Hyperlipidemia, unspecified: Secondary | ICD-10-CM | POA: Insufficient documentation

## 2018-12-24 DIAGNOSIS — E119 Type 2 diabetes mellitus without complications: Secondary | ICD-10-CM | POA: Diagnosis not present

## 2018-12-24 DIAGNOSIS — I6782 Cerebral ischemia: Secondary | ICD-10-CM | POA: Diagnosis present

## 2018-12-24 DIAGNOSIS — R262 Difficulty in walking, not elsewhere classified: Secondary | ICD-10-CM | POA: Diagnosis not present

## 2018-12-24 DIAGNOSIS — Z87891 Personal history of nicotine dependence: Secondary | ICD-10-CM | POA: Insufficient documentation

## 2018-12-24 NOTE — Patient Instructions (Signed)
Graduated return to driving instructions were provided. It is recommended that the patient first drives with another licensed driver in an empty parking lot. If the patient does well with this, and they can drive on a quiet street with the licensed driver. If the patient does well with this they can drive on a busy street with a licensed driver. If the patient does well with this, the next time out they can go by himself.I recommend no nighttime or Interstate driving.

## 2018-12-24 NOTE — Progress Notes (Signed)
Subjective:    Patient ID: Zachary Spence, male    DOB: Jul 03, 1923, 85 y.o.   MRN: [id number] 85 year old right-handed male with history of CAD with CABG, hypertension, diabetes mellitus as well as non-Hodgkin's lymphoma with left foot drop.  He presented 10/17/2018 with generalized weakness, unstable gait.  He lives  with his son.  Independent with a cane and AFO brace prior to admission.  Cranial CT scan unremarkable.  89 old lacunar infarctions.  The patient did not receive tPA.  MRI showed small focus of acute ischemia within the posterior left corona radiata.  HPI Has chronic left foot drop Lost balance while bending to pick up object from [place] Now has reacher.  Scraped arm but otherwise no injury  Cooks and washes clothes  Finished HHPT, OT, 1 wk ago, using walker since CVA, was using cane prior to CVA Pain Inventory Average Pain 0 Pain Right Now no pain My pain is no pain  In the last 24 hours, has pain interfered with the following? General activity 2 Relation with others 0 Enjoyment of life 2 What TIME of day is your pain at its worst? no pain Sleep (in general) Good  Pain is worse with: no pain Pain improves with: no pain Relief from Meds: no pain  Mobility walk with assistance use a walker ability to climb steps?  yes do you drive?  yes  Function retired Do you have any goals in this area?  yes  Neuro/Psych trouble walking  Prior Studies Any changes since last visit?  no  Physicians involved in your care Any changes since last visit?  no   History reviewed. No pertinent family history. Social History   Socioeconomic History  . Marital status: Widowed    Spouse name: Not on file  . Number of children: Not on file  . Years of education: Not on file  . Highest education level: Not on file  Occupational History  . Not on file  Social Needs  . Financial resource strain: Not on file  . Food insecurity:    Worry: Not on file    Inability: Not  on file  . Transportation needs:    Medical: Not on file    Non-medical: Not on file  Tobacco Use  . Smoking status: Former Research scientist (life sciences)  . Smokeless tobacco: Never Used  Substance and Sexual Activity  . Alcohol use: No    Alcohol/week: 0.0 standard drinks  . Drug use: Never  . Sexual activity: Not on file  Lifestyle  . Physical activity:    Days per week: Not on file    Minutes per session: Not on file  . Stress: Not on file  Relationships  . Social connections:    Talks on phone: Not on file    Gets together: Not on file    Attends religious service: Not on file    Active member of club or organization: Not on file    Attends meetings of clubs or organizations: Not on file    Relationship status: Not on file  Other Topics Concern  . Not on file  Social History Narrative  . Not on file   Past Surgical History:  Procedure Laterality Date  . APPENDECTOMY    . CORONARY ARTERY BYPASS GRAFT    . SPLENECTOMY     Past Medical History:  Diagnosis Date  . Bowel obstruction ([place])   . Diabetes mellitus without complication ([place])   . Hypertension   . Non Hodgkin's  lymphoma ([place])   . Stroke ([place])    BP 132/70   Pulse 83   Resp 14   Ht 6' (1.829 m)   Wt 184 lb (83.5 kg)   SpO2 96%   BMI 24.95 kg/m   Opioid Risk Score:   Fall Risk Score:  `1  Depression screen PHQ 2/9  No flowsheet data found.  Review of Systems  Constitutional: Negative.   HENT: Negative.   Eyes: Negative.   Respiratory: Negative.   Cardiovascular: Negative.   Gastrointestinal: Negative.   Endocrine: Negative.   Genitourinary: Negative.   Musculoskeletal: Positive for gait problem.  Allergic/Immunologic: Negative.   Hematological: Negative.   Psychiatric/Behavioral: Negative.   All other systems reviewed and are negative.      Objective:   Physical Exam Vitals signs and nursing note reviewed.  Constitutional:      Appearance: Normal appearance. He is normal weight.  HENT:     Head:  Normocephalic and atraumatic.     Mouth/Throat:     Mouth: Mucous membranes are moist.  Eyes:     Pupils: Pupils are equal, round, and reactive to light.  Neck:     Musculoskeletal: Normal range of motion.  Neurological:     Mental Status: He is alert and oriented to person, place, and time.  Psychiatric:        Mood and Affect: Mood normal.   motor is 5/5 in bilateral deltoid bi, tri, grip, HF, KE,R ADF, 0/5 Left ADF Cerebellar mild tremor in BUE with intention no dysmetria        Assessment & Plan:  1.Left corona radiata infarct with  infarct with gait disorder Not at baseline, would benefit from OP PT for further gait training with goal of ambulating with cane once again

## 2019-01-05 ENCOUNTER — Encounter: Payer: Self-pay | Admitting: Physical Therapy

## 2019-01-05 ENCOUNTER — Ambulatory Visit: Payer: Medicare Other | Attending: Physical Medicine & Rehabilitation | Admitting: Physical Therapy

## 2019-01-05 ENCOUNTER — Other Ambulatory Visit: Payer: Self-pay

## 2019-01-05 VITALS — BP 120/75 | HR 75

## 2019-01-05 DIAGNOSIS — R208 Other disturbances of skin sensation: Secondary | ICD-10-CM | POA: Insufficient documentation

## 2019-01-05 DIAGNOSIS — R2689 Other abnormalities of gait and mobility: Secondary | ICD-10-CM | POA: Insufficient documentation

## 2019-01-05 DIAGNOSIS — M6281 Muscle weakness (generalized): Secondary | ICD-10-CM | POA: Diagnosis present

## 2019-01-05 DIAGNOSIS — R2681 Unsteadiness on feet: Secondary | ICD-10-CM | POA: Diagnosis present

## 2019-01-05 DIAGNOSIS — I69351 Hemiplegia and hemiparesis following cerebral infarction affecting right dominant side: Secondary | ICD-10-CM | POA: Diagnosis not present

## 2019-01-05 DIAGNOSIS — R296 Repeated falls: Secondary | ICD-10-CM

## 2019-01-05 NOTE — Therapy (Signed)
[facility] [address] [place], [place], [address] Phone: [phone]   Fax:  [phone]  Physical Therapy Evaluation  Patient Details  Name: Zachary Spence MRN: [id number] Date of Birth: 12/28/1922 Referring Provider (PT): Charlett Blake, MD   Encounter Date: 01/05/2019  PT End of Session - 01/05/19 2105    Visit Number  1    Number of Visits  17    Date for PT Re-Evaluation  03/06/19    Authorization Type  Medicare and AARP -covered 100%, 10th visit PN    PT Start Time  0937    PT Stop Time  1018    PT Time Calculation (min)  41 min    Activity Tolerance  Patient tolerated treatment well    Behavior During Therapy  [facility] for tasks assessed/performed       Past Medical History:  Diagnosis Date  . Bowel obstruction ([place])   . Diabetes mellitus without complication ([place])   . Hypertension   . Non Hodgkin's lymphoma ([place])   . Stroke [facility])     Past Surgical History:  Procedure Laterality Date  . APPENDECTOMY    . CORONARY ARTERY BYPASS GRAFT    . SPLENECTOMY      Vitals:   01/05/19 0944  BP: 120/75  Pulse: 75  SpO2: 95%     Subjective Assessment - 01/05/19 0944    Subjective  Pt drove self here.  Pt reports CVA in October that mainly affected his RLE.  Pt spent two weeks in CIR and then D/C home with HHPT.  Pt is now back to driving and ambulates with cane for shorter distances, RW for longer distances.  Pt has had a few falls.  Last week he fell against the door when exiting the house and turning around to speak to son.  Last week he had a fall when reaching down to the floor for something.    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  [place] residence    Living Arrangements  Children    Type of Home  House    Home Access  Stairs to enter    Entrance Stairs-Number of Steps  1 + 1 to enter    Entrance Stairs-Rails  None    Home Layout  One level    [place] - 2 wheels;Kasandra [place]udsen - single point      Prior Function   Level of Independence  Independent with household mobility with device;Independent with community mobility with device;Independent with gait;Independent with transfers;Requires assistive device for independence    Vocation Requirements  .    Leisure  going to [employer] each day to read the paper and then work out at the gym for 1 hour before going home.      Observation/Other Assessments   Focus on Therapeutic Outcomes (FOTO)   Not performed      Sensation   Light Touch  Impaired Detail    Light Touch Impaired Details  Impaired RLE;Impaired LLE;Impaired RUE;Impaired LUE      ROM / Strength   AROM / PROM / Strength  Strength      Strength   Overall Strength  Deficits    Overall Strength Comments  LLE: 5/5 except ankle DF-history of foot slap.  RLE: 4/5 overall except ankle DF 3+/5       Ambulation/Gait   Ambulation/Gait  Yes    Ambulation/Gait Assistance  6: Modified independent (Device/Increase time)    Ambulation Distance (Feet)  115 Feet    Assistive device  Rolling walker;Straight cane    Gait Pattern  Step-through pattern;Decreased dorsiflexion - left;Decreased dorsiflexion - right;Left foot flat;Trunk flexed;Poor foot clearance - left    Ambulation Surface  Level;Indoor      Standardized Balance Assessment   Standardized Balance Assessment  Berg Balance Test;Five Times Sit to Stand;10 Spence walk test    Five times sit to stand comments   23.19 seconds without UE support on chair but min A from therapist for balance once standing    10 Spence Walk  with RW: 15.07 seconds or 2.17 ft/sec; with SPC 14.85 seconds or 2.20 ft/sec       Berg Balance Test   Sit to Stand  Able to stand  independently using hands    Standing Unsupported  Able to stand 2 minutes with supervision    Sitting with Back Unsupported but Feet Supported on Floor or Stool  Able to sit safely and securely 2 minutes    Stand to Sit  Controls descent by using hands    Transfers  Able to transfer safely, definite need of hands    Standing Unsupported with Eyes Closed  Needs help to keep from falling    Standing Ubsupported with Feet Together  Needs help to attain position but able to stand for 30 seconds with feet together    From Standing, Reach Forward with Outstretched Arm  Reaches forward but needs supervision    From Standing Position, Pick up Object from Floor  Able to pick up shoe, needs supervision    From Standing Position, Turn to Look Behind Over each Shoulder  Needs assist to keep from losing balance and falling    Turn 360 Degrees  Needs assistance while turning    Standing Unsupported, Alternately Place Feet on Step/Stool  Able to complete >2 steps/needs minimal assist    Standing Unsupported, One Foot in Front  Able to take small step independently and hold 30 seconds    Standing on One Leg   Tries to lift leg/unable to hold 3  seconds but remains standing independently    Total Score  25    Berg comment:  25/56                Objective measurements completed on examination: See above findings.              PT Education - 01/05/19 2105    Education Details  clinical finding, PT POC and goals    Person(s) Educated  Patient    Methods  Explanation    Comprehension  Verbalized understanding       PT Short Term Goals - 01/05/19 2119      PT SHORT TERM GOAL #1   Title  Pt will demonstrate independence with initial HEP    Time  4    Period  Weeks    Status  New    Target Date  02/04/19      PT SHORT TERM GOAL #2   Title  Pt will negotiate one step (x 2 reps) with cane and min A    Time  4    Period  Weeks    Status  New    Target Date  02/04/19      PT SHORT TERM GOAL #3   Title  Pt will improve five time sit to stand from chair to </= 20 seconds with supervision from therapist    Baseline  23.19 but with min A from therapist to maintain balance    Time  4    Period  Weeks    Status  New    Target Date  02/04/19      PT SHORT TERM GOAL #4   Title  Pt will improve BERG to >/= 29/56    Baseline  25/56    Time  4    Period  Weeks    Status  New    Target Date  02/04/19      PT SHORT TERM GOAL #5   Title  Pt will improve gait velocity with cane to >/= 2.62 ft/sec    Baseline  2.20 ft/sec with cane    Time  4    Period  Weeks    Status  New    Target Date  02/04/19        PT Long Term Goals - 01/05/19 2122      PT LONG TERM GOAL #1   Title  Pt will be independent with final HEP and will report returning to [employer] and gym >/= 3 days a week    Time  8    Period  Weeks    Status  New    Target Date  03/06/19      PT LONG TERM GOAL #2   Title  Pt will ambulate 250' outside over paved surfaces and negotiate curb (x 2) with cane and supervision    Time  8    Period  Weeks    Target Date  03/06/19      PT LONG TERM GOAL #3    Title  Pt will improve five time sit to stand to </= 17 seconds with supervision    Time  8    Period  Weeks    Target Date  03/06/19      PT LONG TERM GOAL #4   Title  Pt will improve BERG to >/= 34/56    Time  8    Period  Weeks    Status  New  Target Date  03/06/19      PT LONG TERM GOAL #5   Title  Pt will improve gait velocity with cane to >/= 2.8 ft/sec    Time  8    Period  Weeks    Status  New    Target Date  03/06/19             Plan - 01/05/19 2106    Clinical Impression Statement  Pt is a 85 year old male referred to Neuro OPPT for evaluation of R sided weakness and falls.  Pt's PMH is significant for the following: Non-Hodgkin's lymphoma, HTN, DM with bilat peripheral neuropathy with L foot slap - wears a posterior leaf spring AFO x 4 years, bilat carpal tunnel, CABG and falls. The following deficits were noted during pt's exam: impaired sensation, impaired RLE strength, impaired balance and gait.  Pt's five time sit to stand, BERG scores and gait velocity with RW and with cane indicate pt is at high risk for falls. Pt would benefit from skilled PT to address these impairments and functional limitations to maximize functional mobility independence and reduce falls risk.    History and Personal Factors relevant to plan of care:  lives with son but son works during the day, pt has not been able to return to community activities of going to [employer] and gym, Non-Hodgkin's lymphoma, HTN, DM with bilat peripheral neuropathy with L foot slap - wears a posterior leaf spring AFO x 4 years, bilat carpal tunnel, CABG and falls    Clinical Presentation  Evolving    Clinical Presentation due to:  lives with son but son works during the day, pt has not been able to return to community activities of going to [employer] and gym, Non-Hodgkin's lymphoma, HTN, DM with bilat peripheral neuropathy with L foot slap - wears a posterior leaf spring AFO x 4 years, bilat carpal tunnel, CABG and falls     Clinical Decision Making  Moderate    Rehab Potential  Good    PT Frequency  2x / week    PT Duration  8 weeks    PT Treatment/Interventions  ADLs/Self Care Home Management;Aquatic Therapy;Electrical Stimulation;DME Instruction;Gait training;Stair training;Functional mobility training;Orthotic Fit/Training;Patient/family education;Neuromuscular re-education;Balance training;Therapeutic exercise;Therapeutic activities    PT Next Visit Plan  initiate standing balance and strengthening HEP - pt has greatest difficulty with eyes closed, narrow BOS, compliant surfaces, rotation/pivoting safely, SLS.  Gait training with cane on variety of surfaces and obstacles.  Negotiation of one step with cane.    Consulted and Agree with Plan of Care  Patient       Patient will benefit from skilled therapeutic intervention in order to improve the following deficits and impairments:  Abnormal gait, Decreased balance, Decreased strength, Difficulty walking, Impaired sensation  Visit Diagnosis: Hemiplegia and hemiparesis following cerebral infarction affecting right dominant side (HCC)  Other disturbances of skin sensation  Repeated falls  Muscle weakness (generalized)  Other abnormalities of gait and mobility  Unsteadiness on feet     Problem List Patient Active Problem List   Diagnosis Date Noted  . Gait disturbance, post-stroke   . Slow transit constipation   . Small vessel disease ([place]) 10/19/2018  . Benign essential HTN   . Stage 2 chronic kidney disease   . Sequela of lacunar infarction   . CVA (cerebral vascular accident) ([place]) 10/17/2018  . Burning sensation of the foot 04/12/2015  . Leg weakness 01/08/2015  . Carpal tunnel syndrome 12/26/2014  .  Dropfoot 12/26/2014  . Neuralgia neuritis, sciatic nerve 12/26/2014  . Diabetes mellitus ([place]) 08/24/2014  . BP (high blood pressure) 08/24/2014  . HLD (hyperlipidemia) 08/24/2014  . H/O coronary artery bypass surgery 08/24/2014     Rico Junker, PT, DPT 01/05/19    9:27 PM    [facility] [address] [place], [place], [address] Phone: [phone]   Fax:  [phone]  Name: Zachary Spence MRN: [id number] Date of Birth: 1923/03/30

## 2019-01-10 ENCOUNTER — Encounter: Payer: Self-pay | Admitting: Rehabilitation

## 2019-01-10 ENCOUNTER — Ambulatory Visit: Payer: Medicare Other | Admitting: Rehabilitation

## 2019-01-10 NOTE — Therapy (Signed)
[facility] [address] [place], [place], [address] Phone: [phone]   Fax:  [phone]  Patient Details  Name: Zachary Spence MRN: [id number] Date of Birth: 07/13/1923 Referring Provider:  No ref. provider found  Encounter Date: 01/10/2019  Called and L/VM with patient regarding today's missed visit and to remind him of next visit on 2/5.     Denice Bors 01/10/2019, 1:49 PM  [facility] [address] [place], [place], [address] Phone: [phone]   Fax:  [phone]

## 2019-01-11 ENCOUNTER — Ambulatory Visit: Payer: Medicare Other | Admitting: Rehabilitation

## 2019-01-11 ENCOUNTER — Encounter: Payer: Self-pay | Admitting: Rehabilitation

## 2019-01-11 DIAGNOSIS — I69351 Hemiplegia and hemiparesis following cerebral infarction affecting right dominant side: Secondary | ICD-10-CM

## 2019-01-11 DIAGNOSIS — M6281 Muscle weakness (generalized): Secondary | ICD-10-CM

## 2019-01-11 DIAGNOSIS — R2681 Unsteadiness on feet: Secondary | ICD-10-CM

## 2019-01-11 DIAGNOSIS — R2689 Other abnormalities of gait and mobility: Secondary | ICD-10-CM

## 2019-01-11 DIAGNOSIS — R208 Other disturbances of skin sensation: Secondary | ICD-10-CM

## 2019-01-11 DIAGNOSIS — R296 Repeated falls: Secondary | ICD-10-CM

## 2019-01-11 NOTE — Patient Instructions (Signed)
Access Code: [email or web address]  URL: [email or web address]  Date: 01/11/2019  Prepared by: Cameron Sprang   Exercises  Wide Stance with Eyes Closed - 3 reps - 1 sets - 20 hold - 1x daily - 7x weekly  Wide Stance with Eyes Closed and Head Nods - 10 reps - 1 sets - 1x daily - 7x weekly  Half Tandem Stance Balance with Eyes Closed - 3 reps - 1 sets - 20 hold - 1x daily - 7x weekly  Forward March - 4 reps - 1 sets - 1x daily - 7x weekly  Sit to Stand in Stride with AFO and UE Assist in LE Alignment - 10 reps - 3 sets - 1x daily - 7x weekly

## 2019-01-11 NOTE — Therapy (Signed)
[facility] [address] [place], [place], [address] Phone: [phone]   Fax:  [phone]  Physical Therapy Treatment  Patient Details  Name: Zachary Spence MRN: [id number] Date of Birth: 27-Dec-1922 Referring Provider (PT): Charlett Blake, MD   Encounter Date: 01/11/2019  PT End of Session - 01/11/19 1320    Visit Number  2    Number of Visits  17    Date for PT Re-Evaluation  03/06/19    Authorization Type  Medicare and AARP -covered 100%, 10th visit PN    PT Start Time  7062    PT Stop Time  1400    PT Time Calculation (min)  43 min    Activity Tolerance  Patient tolerated treatment well    Behavior During Therapy  [facility] for tasks assessed/performed       Past Medical History:  Diagnosis Date  . Bowel obstruction ([place])   . Diabetes mellitus without complication ([place])   . Hypertension   . Non Hodgkin's lymphoma ([place])   . Stroke [facility])     Past Surgical History:  Procedure Laterality Date  . APPENDECTOMY    . CORONARY ARTERY BYPASS GRAFT    . SPLENECTOMY      There were no vitals filed for this visit.  Subjective Assessment - 01/11/19 1319    Subjective  Pt reports doing well.  Presents using RW today.     Pertinent History  Non-Hodgkin's lymphoma, HTN, DM with bilat peripheral neuropathy with L foot slap - wears a posterior leaf spring AFO x 4 years, bilat carpal tunnel, CABG and falls    Limitations  Walking    Patient Stated Goals  To improve his balance and return to using the cane mostly, return to going to [employer] daily and working out at [employer] x 1 hour daily    Currently in Pain?  No/denies                       [facility] Adult PT Treatment/Exercise - 01/11/19 1320      Ambulation/Gait   Ambulation/Gait  Yes    Ambulation/Gait Assistance  5: Supervision;4: Min guard    Ambulation/Gait Assistance Details  Pt ambulatory into clinic with RW.  Note some impulsivity in lobby  with getting to standing and then moving RW in front of him after being in standing.   Mod I once walking with RW.  Did have pt ambulate during session with SPC as he reports he uses [facility] when going to [employer] as there are stairs to climb and also when going to the grocery store to get inside to get shopping cart.  Performed gait x 500' with SPC at S to min/guard level.  Cues for upright posture, increased R step length and R heel strike.  Also briefly assessed pt making sudden direction changes x 3 reps with pt needing increased time and increasing step width to adjust, but no overt LOB during session.      Ambulation Distance (Feet)  500 Feet    Assistive device  Straight cane    Gait Pattern  Step-through pattern;Decreased dorsiflexion - left;Decreased dorsiflexion - right;Left foot flat;Trunk flexed;Poor foot clearance - left    Ambulation Surface  Level;Indoor      Neuro Re-ed    Neuro Re-ed Details   Initiated corner and counter top balance tasks during session with emphasis on narrow BOS and/or EC and beginning to assess compliant  surfaces.  See pt instruction for details.  Performed feet apart on pillows with no postural sway, however when feet placed together or EC, pt with more overt LOB bumping elbows into walll and needing chair, therefore did not add compliant surfaces at this time to HEP for safety.  Provided cues for technique with counter top exercises, esp for upright posture throughout.        Exercises   Exercises  Other Exercises    Other Exercises   sit<>stand from lower therapy mat x 10 reps without UE support progressing to sit<>stand with feet staggered (Right foot back to increase R LE activation/strengthening) x 10 reps.  Pt intermittently needing to place hands on lap and needs cues for slower more controlled movement.            Access Code: [email or web address]  URL: [email or web address]  Date: 01/11/2019  Prepared by: Cameron Sprang   Exercises  Wide Stance with  Eyes Closed - 3 reps - 1 sets - 20 hold - 1x daily - 7x weekly  Wide Stance with Eyes Closed and Head Nods - 10 reps - 1 sets - 1x daily - 7x weekly  Half Tandem Stance Balance with Eyes Closed - 3 reps - 1 sets - 20 hold - 1x daily - 7x weekly  Forward March - 4 reps - 1 sets - 1x daily - 7x weekly  Sit to Stand in Stride with AFO and UE Assist in LE Alignment - 10 reps - 3 sets - 1x daily - 7x weekly     PT Education - 01/11/19 1506    Education Details  HEP for balance and strengthing.     Person(s) Educated  Patient    Methods  Explanation;Demonstration;Handout    Comprehension  Verbalized understanding;Returned demonstration       PT Short Term Goals - 01/05/19 2119      PT SHORT TERM GOAL #1   Title  Pt will demonstrate independence with initial HEP    Time  4    Period  Weeks    Status  New    Target Date  02/04/19      PT SHORT TERM GOAL #2   Title  Pt will negotiate one step (x 2 reps) with cane and min A    Time  4    Period  Weeks    Status  New    Target Date  02/04/19      PT SHORT TERM GOAL #3   Title  Pt will improve five time sit to stand from chair to </= 20 seconds with supervision from therapist    Baseline  23.19 but with min A from therapist to maintain balance    Time  4    Period  Weeks    Status  New    Target Date  02/04/19      PT SHORT TERM GOAL #4   Title  Pt will improve BERG to >/= 29/56    Baseline  25/56    Time  4    Period  Weeks    Status  New    Target Date  02/04/19      PT SHORT TERM GOAL #5   Title  Pt will improve gait velocity with cane to >/= 2.62 ft/sec    Baseline  2.20 ft/sec with cane    Time  4    Period  Weeks    Status  New    Target Date  02/04/19  PT Long Term Goals - 01/05/19 2122      PT LONG TERM GOAL #1   Title  Pt will be independent with final HEP and will report returning to [employer] and gym >/= 3 days a week    Time  8    Period  Weeks    Status  New    Target Date  03/06/19      PT  LONG TERM GOAL #2   Title  Pt will ambulate 250' outside over paved surfaces and negotiate curb (x 2) with cane and supervision    Time  8    Period  Weeks    Target Date  03/06/19      PT LONG TERM GOAL #3   Title  Pt will improve five time sit to stand to </= 17 seconds with supervision    Time  8    Period  Weeks    Target Date  03/06/19      PT LONG TERM GOAL #4   Title  Pt will improve BERG to >/= 34/56    Time  8    Period  Weeks    Status  New    Target Date  03/06/19      PT LONG TERM GOAL #5   Title  Pt will improve gait velocity with cane to >/= 2.8 ft/sec    Time  8    Period  Weeks    Status  New    Target Date  03/06/19            Plan - 01/11/19 1320    Clinical Impression Statement  Skilled session focused on initiating HEP for balance with emphasis on narrow BOS and EC as well as SLS and hip strengthening.  Pt tolerated well and also demonstrates gait with SPC at S to min/guard level.  Would like to continue to work on gait with SPC around obstacles and through tight spaces.     Rehab Potential  Good    PT Frequency  2x / week    PT Duration  8 weeks    PT Treatment/Interventions  ADLs/Self Care Home Management;Aquatic Therapy;Electrical Stimulation;DME Instruction;Gait training;Stair training;Functional mobility training;Orthotic Fit/Training;Patient/family education;Neuromuscular re-education;Balance training;Therapeutic exercise;Therapeutic activities    PT Next Visit Plan  Check how HEP is going-add as needed,  pt has greatest difficulty with eyes closed, narrow BOS, compliant surfaces, rotation/pivoting safely, SLS.  Gait training with cane on variety of surfaces and obstacles.  Negotiation of one step with cane.    PT Home Exercise Plan  [email or web address]     Consulted and Agree with Plan of Care  Patient       Patient will benefit from skilled therapeutic intervention in order to improve the following deficits and impairments:  Abnormal gait, Decreased  balance, Decreased strength, Difficulty walking, Impaired sensation  Visit Diagnosis: Hemiplegia and hemiparesis following cerebral infarction affecting right dominant side (HCC)  Other disturbances of skin sensation  Repeated falls  Muscle weakness (generalized)  Other abnormalities of gait and mobility  Unsteadiness on feet     Problem List Patient Active Problem List   Diagnosis Date Noted  . Gait disturbance, post-stroke   . Slow transit constipation   . Small vessel disease ([place]) 10/19/2018  . Benign essential HTN   . Stage 2 chronic kidney disease   . Sequela of lacunar infarction   . CVA (cerebral vascular accident) ([place]) 10/17/2018  . Burning sensation of the foot 04/12/2015  .  Leg weakness 01/08/2015  . Carpal tunnel syndrome 12/26/2014  . Dropfoot 12/26/2014  . Neuralgia neuritis, sciatic nerve 12/26/2014  . Diabetes mellitus ([place]) 08/24/2014  . BP (high blood pressure) 08/24/2014  . HLD (hyperlipidemia) 08/24/2014  . H/O coronary artery bypass surgery 08/24/2014    Denice Bors 01/11/2019, 3:10 PM  [facility] [address] [place], [place], [address] Phone: [phone]   Fax:  [phone]  Name: Zachary Spence MRN: [id number] Date of Birth: 03-Apr-1923

## 2019-01-13 ENCOUNTER — Ambulatory Visit: Payer: Medicare Other | Admitting: Physical Therapy

## 2019-01-13 DIAGNOSIS — I6381 Other cerebral infarction due to occlusion or stenosis of small artery: Secondary | ICD-10-CM | POA: Insufficient documentation

## 2019-01-19 ENCOUNTER — Encounter: Payer: Self-pay | Admitting: Physical Therapy

## 2019-01-19 ENCOUNTER — Ambulatory Visit: Payer: Medicare Other | Admitting: Physical Therapy

## 2019-01-19 DIAGNOSIS — R296 Repeated falls: Secondary | ICD-10-CM

## 2019-01-19 DIAGNOSIS — R2689 Other abnormalities of gait and mobility: Secondary | ICD-10-CM

## 2019-01-19 DIAGNOSIS — R208 Other disturbances of skin sensation: Secondary | ICD-10-CM

## 2019-01-19 DIAGNOSIS — R2681 Unsteadiness on feet: Secondary | ICD-10-CM

## 2019-01-19 DIAGNOSIS — I69351 Hemiplegia and hemiparesis following cerebral infarction affecting right dominant side: Secondary | ICD-10-CM | POA: Diagnosis not present

## 2019-01-19 DIAGNOSIS — M6281 Muscle weakness (generalized): Secondary | ICD-10-CM

## 2019-01-20 NOTE — Therapy (Signed)
[facility] [address] [place], [place], [address] Phone: [phone]   Fax:  [phone]  Physical Therapy Treatment  Patient Details  Name: Zachary Spence MRN: [id number] Date of Birth: 07/10/1923 Referring Provider (PT): Charlett Blake, MD   Encounter Date: 01/19/2019  PT End of Session - 01/20/19 0823    Visit Number  3    Number of Visits  17    Date for PT Re-Evaluation  03/06/19    Authorization Type  Medicare and [employer]ARP -covered 100%, 10th visit PN    PT Start Time  0803    PT Stop Time  0844    PT Time Calculation (min)  41 min    Activity Tolerance  Patient tolerated treatment well    Behavior During Therapy  [facility] for tasks assessed/performed       Past Medical History:  Diagnosis Date  . Bowel obstruction ([place])   . Diabetes mellitus without complication ([place])   . Hypertension   . Non Hodgkin's lymphoma ([place])   . Stroke [facility])     Past Surgical History:  Procedure Laterality Date  . APPENDECTOMY    . CORONARY ARTERY BYPASS GRAFT    . SPLENECTOMY      There were no vitals filed for this visit.  Subjective Assessment - 01/19/19 0804    Subjective  Slight instability when standing from waiting room chair.  Ambulating with RW today.  Things are going well, exercises are going well - still having a hard time with feet together and tandem gait.  Has been taking his cane and going to [employer] every day.    Pertinent History  Non-Hodgkin's lymphoma, HTN, DM with bilat peripheral neuropathy with L foot slap - wears a posterior leaf spring AFO x 4 years, bilat carpal tunnel, CABG and falls    Limitations  Walking    Patient Stated Goals  To improve his balance and return to using the cane mostly, return to going to [employer] daily and working out at [employer] x 1 hour daily    Currently in Pain?  No/denies                       [facility] Adult PT Treatment/Exercise - 01/19/19 0806       Ambulation/Gait   Ambulation/Gait  Yes    Ambulation/Gait Assistance  5: Supervision    Ambulation/Gait Assistance Details  first with single point cane and then with quad tip cane to assess if quad tip improves stability    Ambulation Distance (Feet)  115 Feet    Assistive device  Straight cane    Ambulation Surface  Level;Indoor    Stairs  Yes    Stairs Assistance  5: Supervision    Stairs Assistance Details (indicate cue type and reason)  first two sets with single point cane and changed to quad tip cane for third set.  One episode of posterior LOB with pt using rail to recover balance    Stair Management Technique  One rail Right;Alternating pattern;Forwards;With cane    Number of Stairs  12    Height of Stairs  6    Curb  5: Supervision    Curb Details (indicate cue type and reason)  with single point and quad tip cane with pt demonstrating slight posterior LOB but able to self correct      Exercises   Exercises  Knee/Hip      Knee/Hip  Exercises: Standing   Step Down  Right;Left;1 set;10 reps;Hand Hold: 1   forwards on rockerboard with weight shift         Balance Exercises - 01/19/19 0834      Balance Exercises: Standing   Standing Eyes Opened  Narrow base of support (BOS);Wide (BOA);Head turns   on rockerboard   Standing Eyes Closed  Narrow base of support (BOS);Wide (BOA);10 secs   on rockerboard   Rockerboard  Anterior/posterior;Head turns;EO;EC;10 seconds;10 reps;Intermittent UE support   feet apart, feet together.  L finger tip support forwards       PT Education - 01/20/19 0823    Education Details  cane with quad tip for increased stability, balance reaction training    Person(s) Educated  Patient    Methods  Explanation    Comprehension  Verbalized understanding;Returned demonstration       PT Short Term Goals - 01/05/19 2119      PT SHORT TERM GOAL #1   Title  Pt will demonstrate independence with initial HEP    Time  4    Period  Weeks     Status  New    Target Date  02/04/19      PT SHORT TERM GOAL #2   Title  Pt will negotiate one step (x 2 reps) with cane and min A    Time  4    Period  Weeks    Status  New    Target Date  02/04/19      PT SHORT TERM GOAL #3   Title  Pt will improve five time sit to stand from chair to </= 20 seconds with supervision from therapist    Baseline  23.19 but with min A from therapist to maintain balance    Time  4    Period  Weeks    Status  New    Target Date  02/04/19      PT SHORT TERM GOAL #4   Title  Pt will improve BERG to >/= 29/56    Baseline  25/56    Time  4    Period  Weeks    Status  New    Target Date  02/04/19      PT SHORT TERM GOAL #5   Title  Pt will improve gait velocity with cane to >/= 2.62 ft/sec    Baseline  2.20 ft/sec with cane    Time  4    Period  Weeks    Status  New    Target Date  02/04/19        PT Long Term Goals - 01/05/19 2122      PT LONG TERM GOAL #1   Title  Pt will be independent with final HEP and will report returning to [employer] and gym >/= 3 days a week    Time  8    Period  Weeks    Status  New    Target Date  03/06/19      PT LONG TERM GOAL #2   Title  Pt will ambulate 250' outside over paved surfaces and negotiate curb (x 2) with cane and supervision    Time  8    Period  Weeks    Target Date  03/06/19      PT LONG TERM GOAL #3   Title  Pt will improve five time sit to stand to </= 17 seconds with supervision    Time  8  Period  Weeks    Target Date  03/06/19      PT LONG TERM GOAL #4   Title  Pt will improve BERG to >/= 34/56    Time  8    Period  Weeks    Status  New    Target Date  03/06/19      PT LONG TERM GOAL #5   Title  Pt will improve gait velocity with cane to >/= 2.8 ft/sec    Time  8    Period  Weeks    Status  New    Target Date  03/06/19            Plan - 01/20/19 6761    Clinical Impression Statement  Treatment session focused on gait assessment and training on stairs and when  negotiating one step/curb with SPC.  Also presented pt with quad tip cane for improved stability but pt did not report any improvement in stability.  Continued dynamic balance and balance reaction training on unstable surface to increase use of hip strategy.  Performed step downs forwards to continue to facilitate anterior weight shifting due to patient's tendency to fall posteriorly.  Will continue to address and progress towards LTG.    Rehab Potential  Good    PT Frequency  2x / week    PT Duration  8 weeks    PT Treatment/Interventions  ADLs/Self Care Home Management;Aquatic Therapy;Electrical Stimulation;DME Instruction;Gait training;Stair training;Functional mobility training;Orthotic Fit/Training;Patient/family education;Neuromuscular re-education;Balance training;Therapeutic exercise;Therapeutic activities    PT Next Visit Plan  Check how HEP is going-add as needed,  pt has greatest difficulty with eyes closed, narrow BOS, compliant surfaces, rotation/pivoting safely, SLS.  Pt tends to fall posterior.  Gait training with cane on variety of surfaces and obstacles.  Negotiation of one step with cane.    PT Home Exercise Plan  [id number]     Consulted and Agree with Plan of Care  Patient       Patient will benefit from skilled therapeutic intervention in order to improve the following deficits and impairments:  Abnormal gait, Decreased balance, Decreased strength, Difficulty walking, Impaired sensation  Visit Diagnosis: Hemiplegia and hemiparesis following cerebral infarction affecting right dominant side (HCC)  Other disturbances of skin sensation  Unsteadiness on feet  Other abnormalities of gait and mobility  Repeated falls  Muscle weakness (generalized)     Problem List Patient Active Problem List   Diagnosis Date Noted  . Gait disturbance, post-stroke   . Slow transit constipation   . Small vessel disease ([place]) 10/19/2018  . Benign essential HTN   . Stage 2 chronic kidney  disease   . Sequela of lacunar infarction   . CVA (cerebral vascular accident) ([place]) 10/17/2018  . Burning sensation of the foot 04/12/2015  . Leg weakness 01/08/2015  . Carpal tunnel syndrome 12/26/2014  . Dropfoot 12/26/2014  . Neuralgia neuritis, sciatic nerve 12/26/2014  . Diabetes mellitus ([place]) 08/24/2014  . BP (high blood pressure) 08/24/2014  . HLD (hyperlipidemia) 08/24/2014  . H/O coronary artery bypass surgery 08/24/2014    Rico Junker, PT, DPT 01/20/19    8:34 AM    [facility] [address] [place], [place], [address] Phone: [phone]   Fax:  [phone]  Name: Zachary Spence MRN: [id number] Date of Birth: 15-Oct-1923

## 2019-01-21 ENCOUNTER — Ambulatory Visit: Payer: Medicare Other | Admitting: Rehabilitation

## 2019-01-21 ENCOUNTER — Encounter

## 2019-01-26 ENCOUNTER — Ambulatory Visit: Payer: Medicare Other | Attending: Physical Medicine & Rehabilitation | Admitting: Physical Therapy

## 2019-01-26 ENCOUNTER — Encounter: Payer: Self-pay | Admitting: Physical Therapy

## 2019-01-26 DIAGNOSIS — M21372 Foot drop, left foot: Secondary | ICD-10-CM | POA: Diagnosis present

## 2019-01-26 DIAGNOSIS — I69351 Hemiplegia and hemiparesis following cerebral infarction affecting right dominant side: Secondary | ICD-10-CM | POA: Diagnosis present

## 2019-01-26 DIAGNOSIS — R296 Repeated falls: Secondary | ICD-10-CM | POA: Insufficient documentation

## 2019-01-26 DIAGNOSIS — R2681 Unsteadiness on feet: Secondary | ICD-10-CM | POA: Diagnosis present

## 2019-01-26 DIAGNOSIS — R208 Other disturbances of skin sensation: Secondary | ICD-10-CM | POA: Insufficient documentation

## 2019-01-26 DIAGNOSIS — R2689 Other abnormalities of gait and mobility: Secondary | ICD-10-CM | POA: Insufficient documentation

## 2019-01-26 DIAGNOSIS — M6281 Muscle weakness (generalized): Secondary | ICD-10-CM | POA: Diagnosis present

## 2019-01-26 NOTE — Therapy (Signed)
[facility] [address] [place], [place], [address] Phone: [phone]   Fax:  [phone]  Physical Therapy Treatment  Patient Details  Name: Zachary Spence MRN: [id number] Date of Birth: 25-Nov-1923 Referring Provider (PT): Charlett Blake, MD   Encounter Date: 01/26/2019  PT End of Session - 01/26/19 1049    Visit Number  4    Number of Visits  17    Date for PT Re-Evaluation  03/06/19    Authorization Type  Medicare and AARP -covered 100%, 10th visit PN    PT Start Time  0934    PT Stop Time  1015    PT Time Calculation (min)  41 min    Equipment Utilized During Treatment  Gait belt    Activity Tolerance  Patient tolerated treatment well    Behavior During Therapy  [facility] for tasks assessed/performed       Past Medical History:  Diagnosis Date  . Bowel obstruction ([place])   . Diabetes mellitus without complication ([place])   . Hypertension   . Non Hodgkin's lymphoma ([place])   . Stroke [facility])     Past Surgical History:  Procedure Laterality Date  . APPENDECTOMY    . CORONARY ARTERY BYPASS GRAFT    . SPLENECTOMY      There were no vitals filed for this visit.  Subjective Assessment - 01/26/19 0939    Subjective  Denies falls or changes.      Pertinent History  Non-Hodgkin's lymphoma, HTN, DM with bilat peripheral neuropathy with L foot slap - wears a posterior leaf spring AFO x 4 years, bilat carpal tunnel, CABG and falls    Limitations  Walking    Patient Stated Goals  To improve his balance and return to using the cane mostly, return to going to [employer] daily and working out at [employer] x 1 hour daily         [facility] Adult PT Treatment/Exercise - 01/26/19 0001      Transfers   Transfers  Sit to Stand;Stand to Sit    Sit to Stand  6: Modified independent (Device/Increase time)    Stand to Sit  6: Modified independent (Device/Increase time)    Number of Reps  10 reps;Other sets (comment)   x 3  during session-1 of those on compliant surface.   Comments  Pt tends to lean posterior upon standing and lean back against chair for support.        Ambulation/Gait   Ambulation/Gait  Yes    Ambulation/Gait Assistance  5: Supervision    Ambulation Distance (Feet)  110 Feet   x 2 with RW and x 2 with cane   Assistive device  Rolling walker;Straight cane    Ambulation Surface  Level;Indoor    Stairs  Yes    Stairs Assistance  4: Min guard    Stair Management Technique  One rail Right;Alternating pattern;Forwards;With cane    Number of Stairs  12    Height of Stairs  6    Curb  4: Min assist    Curb Details (indicate cue type and reason)  with SPC.  Pt with LOB posteriorly and needed min assist to recover.    Pre-Gait Activities  Stepping onto/off of step with bil UE support x 15 reps then stepping off/back onto step with UE support x 15 reps.       High Level Balance   High Level Balance Activities  Marching forwards;Marching backwards  High Level Balance Comments  With counter for support.  With increase intensity of marching, pt tends to lose balance posteriorly.          Balance Exercises - 01/26/19 1041      Balance Exercises: Standing   Standing Eyes Opened  Narrow base of support (BOS);Wide (BOA);Head turns;Solid surface;3 reps;20 secs   Difficulty with narrow BOS and with head turns   Standing Eyes Closed  Narrow base of support (BOS);Wide (BOA);Solid surface;3 reps;20 secs;Head turns   Significant LOB with head turns and eyes closed   Other Standing Exercises  Static standing facing up ramp with min assist to maintain balance.          PT Short Term Goals - 01/05/19 2119      PT SHORT TERM GOAL #1   Title  Pt will demonstrate independence with initial HEP    Time  4    Period  Weeks    Status  New    Target Date  02/04/19      PT SHORT TERM GOAL #2   Title  Pt will negotiate one step (x 2 reps) with cane and min A    Time  4    Period  Weeks    Status   New    Target Date  02/04/19      PT SHORT TERM GOAL #3   Title  Pt will improve five time sit to stand from chair to </= 20 seconds with supervision from therapist    Baseline  23.19 but with min A from therapist to maintain balance    Time  4    Period  Weeks    Status  New    Target Date  02/04/19      PT SHORT TERM GOAL #4   Title  Pt will improve BERG to >/= 29/56    Baseline  25/56    Time  4    Period  Weeks    Status  New    Target Date  02/04/19      PT SHORT TERM GOAL #5   Title  Pt will improve gait velocity with cane to >/= 2.62 ft/sec    Baseline  2.20 ft/sec with cane    Time  4    Period  Weeks    Status  New    Target Date  02/04/19        PT Long Term Goals - 01/05/19 2122      PT LONG TERM GOAL #1   Title  Pt will be independent with final HEP and will report returning to [employer] and gym >/= 3 days a week    Time  8    Period  Weeks    Status  New    Target Date  03/06/19      PT LONG TERM GOAL #2   Title  Pt will ambulate 250' outside over paved surfaces and negotiate curb (x 2) with cane and supervision    Time  8    Period  Weeks    Target Date  03/06/19      PT LONG TERM GOAL #3   Title  Pt will improve five time sit to stand to </= 17 seconds with supervision    Time  8    Period  Weeks    Target Date  03/06/19      PT LONG TERM GOAL #4   Title  Pt will improve BERG to >/=  34/56    Time  8    Period  Weeks    Status  New    Target Date  03/06/19      PT LONG TERM GOAL #5   Title  Pt will improve gait velocity with cane to >/= 2.8 ft/sec    Time  8    Period  Weeks    Status  New    Target Date  03/06/19            Plan - 01/26/19 1049    Clinical Impression Statement  Skilled session focused on balance and gait along with curb/step.  Pt with numerous bouts of LOB in clinic and at time needing min assist to recover (unsure if pt would have been able to recover without assist).  Pt denies any falls since last visit.   Continue PT per POC.    Rehab Potential  Good    PT Frequency  2x / week    PT Duration  8 weeks    PT Treatment/Interventions  ADLs/Self Care Home Management;Aquatic Therapy;Electrical Stimulation;DME Instruction;Gait training;Stair training;Functional mobility training;Orthotic Fit/Training;Patient/family education;Neuromuscular re-education;Balance training;Therapeutic exercise;Therapeutic activities    PT Next Visit Plan  Review HEP again and revise as needed(pt with significant difficulty with exercises today during session). pt has greatest difficulty with eyes closed, narrow BOS, compliant surfaces, rotation/pivoting safely, SLS.  Pt tends to fall posterior.  Gait training with cane on variety of surfaces and obstacles.  Negotiation of one step with cane.    PT Home Exercise Plan  [id number]     Consulted and Agree with Plan of Care  Patient       Patient will benefit from skilled therapeutic intervention in order to improve the following deficits and impairments:  Abnormal gait, Decreased balance, Decreased strength, Difficulty walking, Impaired sensation  Visit Diagnosis: Hemiplegia and hemiparesis following cerebral infarction affecting right dominant side (HCC)  Other disturbances of skin sensation  Unsteadiness on feet  Other abnormalities of gait and mobility  Muscle weakness (generalized)  Repeated falls     Problem List Patient Active Problem List   Diagnosis Date Noted  . Gait disturbance, post-stroke   . Slow transit constipation   . Small vessel disease ([place]) 10/19/2018  . Benign essential HTN   . Stage 2 chronic kidney disease   . Sequela of lacunar infarction   . CVA (cerebral vascular accident) ([place]) 10/17/2018  . Burning sensation of the foot 04/12/2015  . Leg weakness 01/08/2015  . Carpal tunnel syndrome 12/26/2014  . Dropfoot 12/26/2014  . Neuralgia neuritis, sciatic nerve 12/26/2014  . Diabetes mellitus ([place]) 08/24/2014  . BP (high blood pressure)  08/24/2014  . HLD (hyperlipidemia) 08/24/2014  . H/O coronary artery bypass surgery 08/24/2014    Narda Bonds, [place] 01/26/19 10:55 AM Phone: [phone] Fax: [facility] [address] [place], [place], [address] Phone: [phone]   Fax:  [phone]  Name: Zachary Spence MRN: [id number] Date of Birth: [birth year]

## 2019-01-28 ENCOUNTER — Encounter: Payer: Self-pay | Admitting: Physical Therapy

## 2019-01-28 ENCOUNTER — Ambulatory Visit: Payer: Medicare Other | Admitting: Physical Therapy

## 2019-01-28 DIAGNOSIS — I69351 Hemiplegia and hemiparesis following cerebral infarction affecting right dominant side: Secondary | ICD-10-CM

## 2019-01-28 DIAGNOSIS — R2681 Unsteadiness on feet: Secondary | ICD-10-CM

## 2019-01-28 DIAGNOSIS — R208 Other disturbances of skin sensation: Secondary | ICD-10-CM

## 2019-01-28 DIAGNOSIS — M6281 Muscle weakness (generalized): Secondary | ICD-10-CM

## 2019-01-28 DIAGNOSIS — R2689 Other abnormalities of gait and mobility: Secondary | ICD-10-CM

## 2019-01-28 DIAGNOSIS — R296 Repeated falls: Secondary | ICD-10-CM

## 2019-01-28 NOTE — Therapy (Signed)
[facility] [address] [place], [place], [address] Phone: [phone]   Fax:  [phone]  Physical Therapy Treatment  Patient Details  Name: Zachary Spence MRN: [id number] Date of Birth: [birth year] Referring Provider (PT): Charlett Blake, MD   Encounter Date: 01/28/2019  PT End of Session - 01/30/19 1431    Visit Number  5    Number of Visits  17    Date for PT Re-Evaluation  03/06/19    Authorization Type  Medicare and AARP -covered 100%, 10th visit PN    PT Start Time  0930    PT Stop Time  1014    PT Time Calculation (min)  44 min    Activity Tolerance  Patient tolerated treatment well    Behavior During Therapy  [facility] for tasks assessed/performed       Past Medical History:  Diagnosis Date  . Bowel obstruction ([place])   . Diabetes mellitus without complication ([place])   . Hypertension   . Non Hodgkin's lymphoma ([place])   . Stroke [facility])     Past Surgical History:  Procedure Laterality Date  . APPENDECTOMY    . CORONARY ARTERY BYPASS GRAFT    . SPLENECTOMY      There were no vitals filed for this visit.  Subjective Assessment - 01/28/19 0933    Subjective  Doing well; drove to [facility] and the dermatologist in the rain yesterday without any difficulty.    Pertinent History  Non-Hodgkin's lymphoma, HTN, DM with bilat peripheral neuropathy with L foot slap - wears a posterior leaf spring AFO x 4 years, bilat carpal tunnel, CABG and falls    Limitations  Walking    Patient Stated Goals  To improve his balance and return to using the cane mostly, return to going to [employer] daily and working out at [employer] x 1 hour daily    Currently in Pain?  No/denies         [facility] Adult PT Treatment/Exercise - 01/28/19 1002      Knee/Hip Exercises: Standing   Lateral Step Up  Right;Left;1 set;15 reps;Hand Hold: 2;Step Height: 4"    Forward Step Up  Right;Left;1 set;15 reps;Hand Hold: 1;Step Height: 4"    Forward Step Up Limitations  cues to control stepping backwards and down due to continued LOB posterior        Weight Shift: Anterior / Posterior (Righting / Equilibrium)    Standing with your back to a wall and feet forwards.  With control - lean hips back, touch wall and then pull hips forwards away from wall back over feet.   Repeat __10-12__ times per session. Do _2___ sessions per day.  Copyright  VHI. All rights reserved.    Access Code: [email or web address]  URL: [email or web address]  Date: 01/28/2019  Prepared by: Misty Stanley   Exercises  Wide Stance with Eyes Closed - 3 sets - 30 second hold - 2x daily - 7x weekly  Wide Stance with Eyes Closed and Head Nods - 10 reps - 1 sets - 1x daily - 7x weekly  Wide Stance with Eyes Closed and Head Rotation - 10 reps - 1 sets - 1x daily - 7x weekly  Romberg Stance with Head Nods - 10 reps - 2 sets - 1x daily - 7x weekly  Romberg Stance with Head Rotation - 10 reps - 2 sets - 1x daily - 7x weekly         PT Education - 01/30/19  1431    Education Details  updated HEP    Person(s) Educated  Patient    Methods  Explanation;Demonstration    Comprehension  Verbalized understanding;Returned demonstration       PT Short Term Goals - 01/05/19 2119      PT SHORT TERM GOAL #1   Title  Pt will demonstrate independence with initial HEP    Time  4    Period  Weeks    Status  New    Target Date  02/04/19      PT SHORT TERM GOAL #2   Title  Pt will negotiate one step (x 2 reps) with cane and min A    Time  4    Period  Weeks    Status  New    Target Date  02/04/19      PT SHORT TERM GOAL #3   Title  Pt will improve five time sit to stand from chair to </= 20 seconds with supervision from therapist    Baseline  23.19 but with min A from therapist to maintain balance    Time  4    Period  Weeks    Status  New    Target Date  02/04/19      PT SHORT TERM GOAL #4   Title  Pt will improve BERG to >/= 29/56    Baseline   25/56    Time  4    Period  Weeks    Status  New    Target Date  02/04/19      PT SHORT TERM GOAL #5   Title  Pt will improve gait velocity with cane to >/= 2.62 ft/sec    Baseline  2.20 ft/sec with cane    Time  4    Period  Weeks    Status  New    Target Date  02/04/19        PT Long Term Goals - 01/05/19 2122      PT LONG TERM GOAL #1   Title  Pt will be independent with final HEP and will report returning to [employer] and gym >/= 3 days a week    Time  8    Period  Weeks    Status  New    Target Date  03/06/19      PT LONG TERM GOAL #2   Title  Pt will ambulate 250' outside over paved surfaces and negotiate curb (x 2) with cane and supervision    Time  8    Period  Weeks    Target Date  03/06/19      PT LONG TERM GOAL #3   Title  Pt will improve five time sit to stand to </= 17 seconds with supervision    Time  8    Period  Weeks    Target Date  03/06/19      PT LONG TERM GOAL #4   Title  Pt will improve BERG to >/= 34/56    Time  8    Period  Weeks    Status  New    Target Date  03/06/19      PT LONG TERM GOAL #5   Title  Pt will improve gait velocity with cane to >/= 2.8 ft/sec    Time  8    Period  Weeks    Status  New    Target Date  03/06/19  Plan - 01/30/19 1432    Clinical Impression Statement  Continued to review patient's HEP; pt stating he is having no difficulty with eyes closed, feet apart but upon review pt experienced significant posterior LOB into the wall each time his eyes closed.  Downgraded current corner balance exercises and added one finger for support to give pt increased proprioceptive feedback.  Added smaller BOS but with eyes open.  Also reviewed and added to HEP wall bumps for hip strategy training.  Performed step ups on step to continue LE strengthening and weight shift training.  Will continue to address and progress towards LTG.    Rehab Potential  Good    PT Frequency  2x / week    PT Duration  8 weeks     PT Treatment/Interventions  ADLs/Self Care Home Management;Aquatic Therapy;Electrical Stimulation;DME Instruction;Gait training;Stair training;Functional mobility training;Orthotic Fit/Training;Patient/family education;Neuromuscular re-education;Balance training;Therapeutic exercise;Therapeutic activities    PT Next Visit Plan  Check STG on 2/12 visit.    Step ups on larger step; balance on ramp; pt has greatest difficulty with eyes closed, narrow BOS, compliant surfaces, rotation/pivoting safely, SLS.  Pt tends to fall posterior.  Gait training with cane on variety of surfaces and obstacles.  Negotiation of one step with cane.    PT Home Exercise Plan  [email or web address]     Consulted and Agree with Plan of Care  Patient       Patient will benefit from skilled therapeutic intervention in order to improve the following deficits and impairments:  Abnormal gait, Decreased balance, Decreased strength, Difficulty walking, Impaired sensation  Visit Diagnosis: Hemiplegia and hemiparesis following cerebral infarction affecting right dominant side (HCC)  Other disturbances of skin sensation  Unsteadiness on feet  Other abnormalities of gait and mobility  Muscle weakness (generalized)  Repeated falls     Problem List Patient Active Problem List   Diagnosis Date Noted  . Gait disturbance, post-stroke   . Slow transit constipation   . Small vessel disease ([place]) 10/19/2018  . Benign essential HTN   . Stage 2 chronic kidney disease   . Sequela of lacunar infarction   . CVA (cerebral vascular accident) ([place]) 10/17/2018  . Burning sensation of the foot 04/12/2015  . Leg weakness 01/08/2015  . Carpal tunnel syndrome 12/26/2014  . Dropfoot 12/26/2014  . Neuralgia neuritis, sciatic nerve 12/26/2014  . Diabetes mellitus ([place]) 08/24/2014  . BP (high blood pressure) 08/24/2014  . HLD (hyperlipidemia) 08/24/2014  . H/O coronary artery bypass surgery 08/24/2014    Rico Junker, PT, DPT 01/30/19     2:36 PM    [facility] [address] [place], [place], [address] Phone: [phone]   Fax:  [phone]  Name: JAME MORRELL MRN: [id number] Date of Birth: 1923/08/22

## 2019-01-28 NOTE — Patient Instructions (Addendum)
Weight Shift: Anterior / Posterior (Righting / Equilibrium)    Standing with your back to a wall and feet forwards.  With control - lean hips back, touch wall and then pull hips forwards away from wall back over feet.   Repeat __10-12__ times per session. Do _2___ sessions per day.  Copyright  VHI. All rights reserved.    Access Code: [email or web address]  URL: [email or web address]  Date: 01/28/2019  Prepared by: Misty Stanley   Exercises  Wide Stance with Eyes Closed - 3 sets - 30 second hold - 2x daily - 7x weekly  Wide Stance with Eyes Closed and Head Nods - 10 reps - 1 sets - 1x daily - 7x weekly  Wide Stance with Eyes Closed and Head Rotation - 10 reps - 1 sets - 1x daily - 7x weekly  Romberg Stance with Head Nods - 10 reps - 2 sets - 1x daily - 7x weekly  Romberg Stance with Head Rotation - 10 reps - 2 sets - 1x daily - 7x weekly  Forward March - 4 reps - 1 sets - 1x daily - 7x weekly  Sit to Stand in Stride with AFO and UE Assist in LE Alignment - 10 reps - 3 sets - 1x daily - 7x weekly

## 2019-01-31 ENCOUNTER — Ambulatory Visit: Payer: Medicare Other | Admitting: Physical Therapy

## 2019-01-31 ENCOUNTER — Encounter: Payer: Self-pay | Admitting: Physical Therapy

## 2019-01-31 DIAGNOSIS — R2689 Other abnormalities of gait and mobility: Secondary | ICD-10-CM

## 2019-01-31 DIAGNOSIS — I69351 Hemiplegia and hemiparesis following cerebral infarction affecting right dominant side: Secondary | ICD-10-CM | POA: Diagnosis not present

## 2019-01-31 DIAGNOSIS — M6281 Muscle weakness (generalized): Secondary | ICD-10-CM

## 2019-01-31 DIAGNOSIS — R2681 Unsteadiness on feet: Secondary | ICD-10-CM

## 2019-01-31 NOTE — Therapy (Signed)
[facility] [address] [place], [place], [address] Phone: [phone]   Fax:  [phone]  Physical Therapy Treatment  Patient Details  Name: Zachary Spence MRN: [id number] Date of Birth: 1923/09/24 Referring Provider (PT): Charlett Blake, MD   Encounter Date: 01/31/2019  PT End of Session - 01/31/19 0935    Visit Number  6    Number of Visits  17    Date for PT Re-Evaluation  03/06/19    Authorization Type  Medicare and AARP -covered 100%, 10th visit PN    PT Start Time  0931    PT Stop Time  1011    PT Time Calculation (min)  40 min    Activity Tolerance  Patient tolerated treatment well    Behavior During Therapy  [facility] for tasks assessed/performed       Past Medical History:  Diagnosis Date  . Bowel obstruction ([place])   . Diabetes mellitus without complication ([place])   . Hypertension   . Non Hodgkin's lymphoma ([place])   . Stroke [facility])     Past Surgical History:  Procedure Laterality Date  . APPENDECTOMY    . CORONARY ARTERY BYPASS GRAFT    . SPLENECTOMY      There were no vitals filed for this visit.  Subjective Assessment - 01/31/19 0935    Subjective  No new complaints. No falls or pain to report.     Pertinent History  Non-Hodgkin's lymphoma, HTN, DM with bilat peripheral neuropathy with L foot slap - wears a posterior leaf spring AFO x 4 years, bilat carpal tunnel, CABG and falls    Patient Stated Goals  To improve his balance and return to using the cane mostly, return to going to [employer] daily and working out at [employer] x 1 hour daily    Currently in Pain?  No/denies           [facility] Adult PT Treatment/Exercise - 01/31/19 0944      Transfers   Transfers  Sit to Stand;Stand to Sit    Sit to Stand  6: Modified independent (Device/Increase time)    Stand to Sit  6: Modified independent (Device/Increase time)      Ambulation/Gait   Ambulation/Gait  Yes    Ambulation/Gait Assistance   5: Supervision    Ambulation Distance (Feet)  --   around gym with activities   Assistive device  Rolling walker    Gait Pattern  Step-through pattern;Decreased dorsiflexion - left;Decreased dorsiflexion - right;Left foot flat;Trunk flexed;Poor foot clearance - left    Ambulation Surface  Level;Indoor      Knee/Hip Exercises: Standing   Forward Step Up  Both;1 set;10 reps;Hand Hold: 1;Step Height: 4";Limitations    Forward Step Up Limitations  cues for technique and slow, controlled movements. min guard assist for balance with single UE support.          Balance Exercises - 01/31/19 0942      Balance Exercises: Standing   Standing Eyes Closed  Narrow base of support (BOS);Wide (BOA);Foam/compliant surface;Head turns;Other reps (comment);30 secs;Limitations    Wall Bumps  Hip    Wall Bumps-Hips  Eyes opened;Anterior/posterior;Foam/compliant surface;5 reps;10 reps;Limitations    Balance Beam  standing across blue foam beam: alternating fwd stepping to floor/back onto beam, then alternating bwd stepping to floor/back onto beam. light UE support with cues on step length/step height. min guard to min assist for balance.     Other Standing Exercises  facing up ramp with RW in front, feet hip width apart: EC no head movements, progressing to EC head movements left<>right, then up<>down. min to mod assist for balance with head movements, had pt open eyes for 4-5 reps then close with improved balance/less assistance needed at that time.        Balance Exercises: Standing   Standing Eyes Closed Limitations  on airex in corner with chair in front for safety: feet apart progressing to feet together for EC no head movements, progressing to EC head movments left<>right, then up<>down. min assist with cues for weight shifting/posture to decr posterior lean.     Wall Bumps Limitations  performed on floor: 5 reps with wide stance, 10 reps with narrow stance with min guard assist, chair in front for  safety; then on airex for 10 reps each wide stance, narrow stance with min assist for balance.          PT Short Term Goals - 01/05/19 2119      PT SHORT TERM GOAL #1   Title  Pt will demonstrate independence with initial HEP    Time  4    Period  Weeks    Status  New    Target Date  02/04/19      PT SHORT TERM GOAL #2   Title  Pt will negotiate one step (x 2 reps) with cane and min A    Time  4    Period  Weeks    Status  New    Target Date  02/04/19      PT SHORT TERM GOAL #3   Title  Pt will improve five time sit to stand from chair to </= 20 seconds with supervision from therapist    Baseline  23.19 but with min A from therapist to maintain balance    Time  4    Period  Weeks    Status  New    Target Date  02/04/19      PT SHORT TERM GOAL #4   Title  Pt will improve BERG to >/= 29/56    Baseline  25/56    Time  4    Period  Weeks    Status  New    Target Date  02/04/19      PT SHORT TERM GOAL #5   Title  Pt will improve gait velocity with cane to >/= 2.62 ft/sec    Baseline  2.20 ft/sec with cane    Time  4    Period  Weeks    Status  New    Target Date  02/04/19        PT Long Term Goals - 01/05/19 2122      PT LONG TERM GOAL #1   Title  Pt will be independent with final HEP and will report returning to [employer] and gym >/= 3 days a week    Time  8    Period  Weeks    Status  New    Target Date  03/06/19      PT LONG TERM GOAL #2   Title  Pt will ambulate 250' outside over paved surfaces and negotiate curb (x 2) with cane and supervision    Time  8    Period  Weeks    Target Date  03/06/19      PT LONG TERM GOAL #3   Title  Pt will improve five time sit to stand to </= 17 seconds  with supervision    Time  8    Period  Weeks    Target Date  03/06/19      PT LONG TERM GOAL #4   Title  Pt will improve BERG to >/= 34/56    Time  8    Period  Weeks    Status  New    Target Date  03/06/19      PT LONG TERM GOAL #5   Title  Pt will  improve gait velocity with cane to >/= 2.8 ft/sec    Time  8    Period  Weeks    Status  New    Target Date  03/06/19            Plan - 01/31/19 0936    Clinical Impression Statement  Today's skilled session focused on strengthening and balance reactions with no issues reported by patient. Continues to have a posterior preference with vision removed, increased when on compliant surfaces. Will plan to check STGs at next session.     Rehab Potential  Good    PT Frequency  2x / week    PT Duration  8 weeks    PT Treatment/Interventions  ADLs/Self Care Home Management;Aquatic Therapy;Electrical Stimulation;DME Instruction;Gait training;Stair training;Functional mobility training;Orthotic Fit/Training;Patient/family education;Neuromuscular re-education;Balance training;Therapeutic exercise;Therapeutic activities    PT Next Visit Plan  Check STG on 2/12 visit.    Step ups on larger step; balance on ramp; pt has greatest difficulty with eyes closed, narrow BOS, compliant surfaces, rotation/pivoting safely, SLS.  Pt tends to fall posterior.  Gait training with cane on variety of surfaces and obstacles.  Negotiation of one step with cane.    PT Home Exercise Plan  [email or web address]     Consulted and Agree with Plan of Care  Patient       Patient will benefit from skilled therapeutic intervention in order to improve the following deficits and impairments:  Abnormal gait, Decreased balance, Decreased strength, Difficulty walking, Impaired sensation  Visit Diagnosis: Hemiplegia and hemiparesis following cerebral infarction affecting right dominant side (HCC)  Unsteadiness on feet  Other abnormalities of gait and mobility  Muscle weakness (generalized)     Problem List Patient Active Problem List   Diagnosis Date Noted  . Gait disturbance, post-stroke   . Slow transit constipation   . Small vessel disease ([place]) 10/19/2018  . Benign essential HTN   . Stage 2 chronic kidney disease   . Sequela  of lacunar infarction   . CVA (cerebral vascular accident) ([place]) 10/17/2018  . Burning sensation of the foot 04/12/2015  . Leg weakness 01/08/2015  . Carpal tunnel syndrome 12/26/2014  . Dropfoot 12/26/2014  . Neuralgia neuritis, sciatic nerve 12/26/2014  . Diabetes mellitus ([place]) 08/24/2014  . BP (high blood pressure) 08/24/2014  . HLD (hyperlipidemia) 08/24/2014  . H/O coronary artery bypass surgery 08/24/2014    Willow Ora, PTA, [facility] [address], [place], [place] [address] [phone] 01/31/19, 12:22 PM   Name: Zachary Spence MRN: [id number] Date of Birth: [birth year]

## 2019-02-02 ENCOUNTER — Ambulatory Visit: Payer: Medicare Other | Admitting: Physical Therapy

## 2019-02-02 ENCOUNTER — Encounter: Payer: Self-pay | Admitting: Physical Therapy

## 2019-02-02 DIAGNOSIS — R2681 Unsteadiness on feet: Secondary | ICD-10-CM

## 2019-02-02 DIAGNOSIS — M6281 Muscle weakness (generalized): Secondary | ICD-10-CM

## 2019-02-02 DIAGNOSIS — R296 Repeated falls: Secondary | ICD-10-CM

## 2019-02-02 DIAGNOSIS — I69351 Hemiplegia and hemiparesis following cerebral infarction affecting right dominant side: Secondary | ICD-10-CM | POA: Diagnosis not present

## 2019-02-02 DIAGNOSIS — R2689 Other abnormalities of gait and mobility: Secondary | ICD-10-CM

## 2019-02-02 DIAGNOSIS — R208 Other disturbances of skin sensation: Secondary | ICD-10-CM

## 2019-02-02 NOTE — Therapy (Signed)
[facility] [address] [place], [place], [address] Phone: [phone]   Fax:  [phone]  Physical Therapy Treatment  Patient Details  Name: Zachary Spence MRN: [id number] Date of Birth: 1923/08/07 Referring Provider (PT): Charlett Blake, MD   Encounter Date: 02/02/2019  PT End of Session - 02/02/19 1311    Visit Number  7    Number of Visits  17    Date for PT Re-Evaluation  03/06/19    Authorization Type  Medicare and AARP -covered 100%, 10th visit PN    PT Start Time  1104    PT Stop Time  1146    PT Time Calculation (min)  42 min    Equipment Utilized During Treatment  Gait belt    Activity Tolerance  Patient tolerated treatment well    Behavior During Therapy  WFL for tasks assessed/performed       Past Medical History:  Diagnosis Date  . Bowel obstruction ([place])   . Diabetes mellitus without complication ([place])   . Hypertension   . Non Hodgkin's lymphoma ([place])   . Stroke [facility])     Past Surgical History:  Procedure Laterality Date  . APPENDECTOMY    . CORONARY ARTERY BYPASS GRAFT    . SPLENECTOMY      There were no vitals filed for this visit.  Subjective Assessment - 02/02/19 1110    Subjective  Denies falls or changes.    Pertinent History  Non-Hodgkin's lymphoma, HTN, DM with bilat peripheral neuropathy with L foot slap - wears a posterior leaf spring AFO x 4 years, bilat carpal tunnel, CABG and falls    Patient Stated Goals  To improve his balance and return to using the cane mostly, return to going to [employer] daily and working out at [employer] x 1 hour daily    Currently in Pain?  No/denies          [facility] Adult PT Treatment/Exercise - 02/02/19 0001      Transfers   Transfers  Sit to Stand;Stand to Sit    Sit to Stand  6: Modified independent (Device/Increase time);With upper extremity assist;From chair/3-in-1;Multiple attempts    Five time sit to stand comments   1st attempt  20.43 sec with min assist to maintain balance, 2nd attempt 16.50 with min assist to balance.  15.47 sec with UE on chair arms. 18.78 with arms out in front with 1 episode of min assist    Stand to Sit  6: Modified independent (Device/Increase time);With upper extremity assist;Uncontrolled descent;To chair/3-in-1    Number of Reps  10 reps;Other sets (comment)   See 5x sit<>stand comments     Ambulation/Gait   Ambulation/Gait  Yes    Ambulation/Gait Assistance  5: Supervision    Ambulation Distance (Feet)  --   around gym during activities with [facility]   Assistive device  Rolling walker;Straight cane   RW into/out of clinic   Gait Pattern  Step-through pattern;Decreased dorsiflexion - left;Decreased dorsiflexion - right;Left foot flat;Trunk flexed;Poor foot clearance - left    Ambulation Surface  Level;Indoor    Stairs  Yes    Stairs Assistance  5: Supervision    Stair Management Technique  One rail Right;Alternating pattern;Forwards;With cane    Number of Stairs  4   x 4 reps   Height of Stairs  6    Curb  4: Min assist;5: Supervision    Curb Details (indicate cue type and reason)  If pt stops on incline of curb has lob posterior and min assist to recover.  Unsure if pt could recover without assistance.      Pre-Gait Activities  Standing on ramp facing forward then downhill working on static balance.  Pt initially min assist but progresses to supervision with cane      Standardized Balance Assessment   Standardized Balance Assessment  Berg Balance Test      Berg Balance Test   Sit to Stand  Able to stand  independently using hands    Standing Unsupported  Able to stand 2 minutes with supervision    Sitting with Back Unsupported but Feet Supported on Floor or Stool  Able to sit safely and securely 2 minutes    Stand to Sit  Controls descent by using hands    Transfers  Able to transfer safely, definite need of hands    Standing Unsupported with Eyes Closed  Unable to keep eyes closed 3  seconds but stays steady    Standing Ubsupported with Feet Together  Needs help to attain position but able to stand for 30 seconds with feet together    From Standing, Reach Forward with Outstretched Arm  Can reach forward >5 cm safely (2")    From Standing Position, Pick up Object from Floor  Able to pick up shoe, needs supervision    From Standing Position, Turn to Look Behind Over each Shoulder  Turn sideways only but maintains balance    Turn 360 Degrees  Needs close supervision or verbal cueing    Standing Unsupported, Alternately Place Feet on Step/Stool  Able to complete >2 steps/needs minimal assist    Standing Unsupported, One Foot in Front  Needs help to step but can hold 15 seconds    Standing on One Leg  Tries to lift leg/unable to hold 3 seconds but remains standing independently    Total Score  29        PT Short Term Goals - 02/02/19 1312      PT SHORT TERM GOAL #1   Title  Pt will demonstrate independence with initial HEP    Time  4    Period  Weeks    Status  New    Target Date  02/04/19      PT SHORT TERM GOAL #2   Title  Pt will negotiate one step (x 2 reps) with cane and min A    Time  4    Period  Weeks    Status  Achieved    Target Date  02/04/19      PT SHORT TERM GOAL #3   Title  Pt will improve five time sit to stand from chair to </= 20 seconds with supervision from therapist    Baseline  23.19 but with min A from therapist to maintain balance; 16.5 on 2/12 but needs min assist to balance without UE assist    Time  4    Period  Weeks    Status  Not Met    Target Date  02/04/19      PT SHORT TERM GOAL #4   Title  Pt will improve BERG to >/= 29/56    Baseline  25/56; 29/56 on 02/02/19    Time  4    Period  Weeks    Status  Achieved    Target Date  02/04/19      PT SHORT TERM GOAL #5   Title  Pt will improve gait  velocity with cane to >/= 2.62 ft/sec    Baseline  2.20 ft/sec with cane    Time  4    Period  Weeks    Status  New    Target Date   02/04/19        PT Long Term Goals - 01/05/19 2122      PT LONG TERM GOAL #1   Title  Pt will be independent with final HEP and will report returning to [employer] and gym >/= 3 days a week    Time  8    Period  Weeks    Status  New    Target Date  03/06/19      PT LONG TERM GOAL #2   Title  Pt will ambulate 250' outside over paved surfaces and negotiate curb (x 2) with cane and supervision    Time  8    Period  Weeks    Target Date  03/06/19      PT LONG TERM GOAL #3   Title  Pt will improve five time sit to stand to </= 17 seconds with supervision    Time  8    Period  Weeks    Target Date  03/06/19      PT LONG TERM GOAL #4   Title  Pt will improve BERG to >/= 34/56    Time  8    Period  Weeks    Status  New    Target Date  03/06/19      PT LONG TERM GOAL #5   Title  Pt will improve gait velocity with cane to >/= 2.8 ft/sec    Time  8    Period  Weeks    Status  New    Target Date  03/06/19            Plan - 02/02/19 1314    Clinical Impression Statement  Pt me STG 2 and 4.  STG 3 unmet as pt still has posterior lean with transfers and needs min assist to balance without UE support.  Pt continues to be unsteady in clinic with activities.  Continue PT per POC.    Rehab Potential  Good    PT Frequency  2x / week    PT Duration  8 weeks    PT Treatment/Interventions  ADLs/Self Care Home Management;Aquatic Therapy;Electrical Stimulation;DME Instruction;Gait training;Stair training;Functional mobility training;Orthotic Fit/Training;Patient/family education;Neuromuscular re-education;Balance training;Therapeutic exercise;Therapeutic activities    PT Next Visit Plan  Finish checking STG's for HEP and gait velocity.  Assess balance without AFO on L.  Step ups on larger step; balance on ramp; pt has greatest difficulty with eyes closed, narrow BOS, compliant surfaces, rotation/pivoting safely, SLS.  Pt tends to fall posterior.  Gait training with cane on variety of  surfaces and obstacles.  Negotiation of one step with cane.    PT Home Exercise Plan  [email or web address]     Consulted and Agree with Plan of Care  Patient       Patient will benefit from skilled therapeutic intervention in order to improve the following deficits and impairments:  Abnormal gait, Decreased balance, Decreased strength, Difficulty walking, Impaired sensation  Visit Diagnosis: Hemiplegia and hemiparesis following cerebral infarction affecting right dominant side (HCC)  Unsteadiness on feet  Other abnormalities of gait and mobility  Muscle weakness (generalized)  Other disturbances of skin sensation  Repeated falls     Problem List Patient Active Problem List   Diagnosis Date Noted  .  Gait disturbance, post-stroke   . Slow transit constipation   . Small vessel disease ([place]) 10/19/2018  . Benign essential HTN   . Stage 2 chronic kidney disease   . Sequela of lacunar infarction   . CVA (cerebral vascular accident) ([place]) 10/17/2018  . Burning sensation of the foot 04/12/2015  . Leg weakness 01/08/2015  . Carpal tunnel syndrome 12/26/2014  . Dropfoot 12/26/2014  . Neuralgia neuritis, sciatic nerve 12/26/2014  . Diabetes mellitus ([place]) 08/24/2014  . BP (high blood pressure) 08/24/2014  . HLD (hyperlipidemia) 08/24/2014  . H/O coronary artery bypass surgery 08/24/2014    Narda Bonds, PTA [facility] 02/02/19 1:18 PM Phone: [phone] Fax: [facility] [address] [place], [place], [address] Phone: [phone]   Fax:  [phone]  Name: VONTRELL PULLMAN MRN: [id number] Date of Birth: [birth year]

## 2019-02-04 ENCOUNTER — Ambulatory Visit (HOSPITAL_BASED_OUTPATIENT_CLINIC_OR_DEPARTMENT_OTHER): Payer: Medicare Other | Admitting: Physical Medicine & Rehabilitation

## 2019-02-04 ENCOUNTER — Encounter: Payer: Medicare Other | Attending: Registered Nurse

## 2019-02-04 ENCOUNTER — Other Ambulatory Visit: Payer: Self-pay

## 2019-02-04 ENCOUNTER — Encounter: Payer: Self-pay | Admitting: Physical Medicine & Rehabilitation

## 2019-02-04 VITALS — BP 127/72 | HR 81 | Ht 72.0 in | Wt 185.0 lb

## 2019-02-04 DIAGNOSIS — Z8673 Personal history of transient ischemic attack (TIA), and cerebral infarction without residual deficits: Secondary | ICD-10-CM | POA: Insufficient documentation

## 2019-02-04 DIAGNOSIS — R262 Difficulty in walking, not elsewhere classified: Secondary | ICD-10-CM | POA: Insufficient documentation

## 2019-02-04 DIAGNOSIS — I6782 Cerebral ischemia: Secondary | ICD-10-CM | POA: Diagnosis present

## 2019-02-04 DIAGNOSIS — I739 Peripheral vascular disease, unspecified: Secondary | ICD-10-CM | POA: Insufficient documentation

## 2019-02-04 DIAGNOSIS — E119 Type 2 diabetes mellitus without complications: Secondary | ICD-10-CM | POA: Insufficient documentation

## 2019-02-04 DIAGNOSIS — Z9081 Acquired absence of spleen: Secondary | ICD-10-CM | POA: Insufficient documentation

## 2019-02-04 DIAGNOSIS — R269 Unspecified abnormalities of gait and mobility: Secondary | ICD-10-CM

## 2019-02-04 DIAGNOSIS — E785 Hyperlipidemia, unspecified: Secondary | ICD-10-CM | POA: Insufficient documentation

## 2019-02-04 DIAGNOSIS — I69398 Other sequelae of cerebral infarction: Secondary | ICD-10-CM | POA: Diagnosis not present

## 2019-02-04 DIAGNOSIS — Z9889 Other specified postprocedural states: Secondary | ICD-10-CM | POA: Diagnosis not present

## 2019-02-04 DIAGNOSIS — Z951 Presence of aortocoronary bypass graft: Secondary | ICD-10-CM | POA: Insufficient documentation

## 2019-02-04 DIAGNOSIS — I10 Essential (primary) hypertension: Secondary | ICD-10-CM | POA: Insufficient documentation

## 2019-02-04 DIAGNOSIS — Z87891 Personal history of nicotine dependence: Secondary | ICD-10-CM | POA: Diagnosis not present

## 2019-02-04 NOTE — Progress Notes (Addendum)
Subjective:    Patient ID: Zachary Spence, male    DOB: 21-Apr-1923, 85 y.o.   MRN: [id number] 85 year old right-handed male with history of CAD with CABG, hypertension, diabetes mellitus as well as non-Hodgkin's lymphoma with left foot drop.  He presented 10/17/2018 with generalized weakness, unstable gait.  He lives  with his son.  Independent with a cane and AFO brace prior to admission.  Cranial CT scan unremarkable.  23 old lacunar infarctions.  The patient did not receive tPA.  MRI showed small focus of acute ischemia within the posterior left corona radiata.  HPI Attending OP therapy PT, back to using cane for the most part. Uses walker only when he is up for prolonged period of time (90% time using cane)  No falls Some constipation- eats an apple a day Doesn't drink much water Eats out for lunch time Pain Inventory Average Pain 0 Pain Right Now 0 My pain is na  In the last 24 hours, has pain interfered with the following? General activity 0 Relation with others 0 Enjoyment of life 0 What TIME of day is your pain at its worst? na Sleep (in general) Good  Pain is worse with: na Pain improves with: na Relief from Meds: na  Mobility use a cane use a walker how many minutes can you walk? 60 ability to climb steps?  yes do you drive?  yes  Function not employed: date last employed 1988  Neuro/Psych bowel control problems  Prior Studies Any changes since last visit?  no  Physicians involved in your care Any changes since last visit?  no   No family history on file. Social History   Socioeconomic History  . Marital status: Widowed    Spouse name: Not on file  . Number of children: Not on file  . Years of education: Not on file  . Highest education level: Not on file  Occupational History  . Not on file  Social Needs  . Financial resource strain: Not on file  . Food insecurity:    Worry: Not on file    Inability: Not on file  . Transportation  needs:    Medical: Not on file    Non-medical: Not on file  Tobacco Use  . Smoking status: Former Research scientist (life sciences)  . Smokeless tobacco: Never Used  Substance and Sexual Activity  . Alcohol use: No    Alcohol/week: 0.0 standard drinks  . Drug use: Never  . Sexual activity: Not on file  Lifestyle  . Physical activity:    Days per week: Not on file    Minutes per session: Not on file  . Stress: Not on file  Relationships  . Social connections:    Talks on phone: Not on file    Gets together: Not on file    Attends religious service: Not on file    Active member of club or organization: Not on file    Attends meetings of clubs or organizations: Not on file    Relationship status: Not on file  Other Topics Concern  . Not on file  Social History Narrative  . Not on file   Past Surgical History:  Procedure Laterality Date  . APPENDECTOMY    . CORONARY ARTERY BYPASS GRAFT    . SPLENECTOMY     Past Medical History:  Diagnosis Date  . Bowel obstruction ([place])   . Diabetes mellitus without complication ([place])   . Hypertension   . Non Hodgkin's lymphoma ([place])   .  Stroke ([place])    BP 127/72   Pulse 81   Ht 6' (1.829 m)   Wt 185 lb (83.9 kg)   SpO2 95%   BMI 25.09 kg/m   Opioid Risk Score:   Fall Risk Score:  `1  Depression screen PHQ 2/9  Depression screen PHQ 2/9 02/04/2019  Decreased Interest 0  Down, Depressed, Hopeless 0  PHQ - 2 Score 0    Review of Systems  Constitutional: Negative.   HENT: Negative.   Eyes: Negative.   Respiratory: Negative.   Cardiovascular: Negative.   Gastrointestinal: Positive for constipation.  Endocrine: Negative.   Genitourinary: Negative.   Musculoskeletal: Negative.   Skin: Negative.   Allergic/Immunologic: Negative.   Neurological: Negative.   Hematological: Negative.   Psychiatric/Behavioral: Negative.   All other systems reviewed and are negative.      Objective:   Physical Exam Vitals signs and nursing note reviewed.    Constitutional:      Appearance: Normal appearance.  HENT:     Head: Normocephalic and atraumatic.     Nose: Nose normal.     Mouth/Throat:     Mouth: Mucous membranes are moist.  Eyes:     Extraocular Movements: Extraocular movements intact.     Conjunctiva/sclera: Conjunctivae normal.     Pupils: Pupils are equal, round, and reactive to light.  Skin:    General: Skin is warm and dry.  Neurological:     Mental Status: He is alert.           Assessment & Plan:  1.  Gait disorder related to stroke he has made some improvements is now back to using a cane 90% of the time.  He will finish out his outpatient PT. Current AFO is not controlling the foot drop adequately and there is some ankle instability.  Therefore I am recommending a new AFO No physical medicine rehab follow-up needed We will follow-up with neurology at the end of March Continue follow-up with primary care

## 2019-02-04 NOTE — Patient Instructions (Signed)
Please call if you'd like re evaluation

## 2019-02-07 ENCOUNTER — Encounter: Payer: Self-pay | Admitting: Rehabilitation

## 2019-02-07 ENCOUNTER — Ambulatory Visit: Payer: Medicare Other | Admitting: Rehabilitation

## 2019-02-07 DIAGNOSIS — I69351 Hemiplegia and hemiparesis following cerebral infarction affecting right dominant side: Secondary | ICD-10-CM | POA: Diagnosis not present

## 2019-02-07 DIAGNOSIS — M6281 Muscle weakness (generalized): Secondary | ICD-10-CM

## 2019-02-07 DIAGNOSIS — R2681 Unsteadiness on feet: Secondary | ICD-10-CM

## 2019-02-07 DIAGNOSIS — R2689 Other abnormalities of gait and mobility: Secondary | ICD-10-CM

## 2019-02-07 DIAGNOSIS — R208 Other disturbances of skin sensation: Secondary | ICD-10-CM

## 2019-02-07 NOTE — Patient Instructions (Signed)
Access Code: [email or web address]  URL: [email or web address]  Date: 02/07/2019  Prepared by: Cameron Sprang   Exercises  Wide Stance with Eyes Closed - 3 sets - 30 second hold - 2x daily - 7x weekly  Wide Stance with Eyes Closed and Head Nods - 10 reps - 1 sets - 1x daily - 7x weekly  Wide Stance with Eyes Closed and Head Rotation - 10 reps - 1 sets - 1x daily - 7x weekly  Romberg Stance with Head Nods - 10 reps - 2 sets - 1x daily - 7x weekly  Romberg Stance with Head Rotation - 10 reps - 2 sets - 1x daily - 7x weekly  Forward March - 4 reps - 1 sets - 1x daily - 7x weekly  Sit to Stand in Stride with AFO and UE Assist in LE Alignment - 10 reps - 3 sets - 1x daily - 7x weekly

## 2019-02-07 NOTE — Therapy (Signed)
[facility] [address] [place], [place], [address] Phone: [phone]   Fax:  [phone]  Physical Therapy Treatment  Patient Details  Name: Zachary Spence MRN: [id number] Date of Birth: [birth year] Referring Provider (PT): Charlett Blake, MD   Encounter Date: 02/07/2019  PT End of Session - 02/07/19 1138    Visit Number  8    Number of Visits  17    Date for PT Re-Evaluation  03/06/19    Authorization Type  Medicare and AARP -covered 100%, 10th visit PN    PT Start Time  0932    PT Stop Time  1015    PT Time Calculation (min)  43 min    Equipment Utilized During Treatment  Gait belt    Activity Tolerance  Patient tolerated treatment well    Behavior During Therapy  WFL for tasks assessed/performed       Past Medical History:  Diagnosis Date  . Bowel obstruction ([place])   . Diabetes mellitus without complication ([place])   . Hypertension   . Non Hodgkin's lymphoma ([place])   . Stroke [facility])     Past Surgical History:  Procedure Laterality Date  . APPENDECTOMY    . CORONARY ARTERY BYPASS GRAFT    . SPLENECTOMY      There were no vitals filed for this visit.  Subjective Assessment - 02/07/19 0935    Subjective  Pt reports still going to [employer] with cane so that he can do stairs.     Pertinent History  Non-Hodgkin's lymphoma, HTN, DM with bilat peripheral neuropathy with L foot slap - wears a posterior leaf spring AFO x 4 years, bilat carpal tunnel, CABG and falls    Limitations  Walking    Patient Stated Goals  To improve his balance and return to using the cane mostly, return to going to [employer] daily and working out at [employer] x 1 hour daily    Currently in Pain?  No/denies                       [facility] Adult PT Treatment/Exercise - 02/07/19 0935      Ambulation/Gait   Ambulation/Gait  Yes    Ambulation/Gait Assistance  4: Min guard;5: Supervision;4: Min assist    Ambulation/Gait  Assistance Details  Pt ambulatory into and out of clinic with RW at mod I level, however had him ambulate with SPC during session and note he varies from S level to min A level due to occassional LOB.  Did note L foot catch during session and intermittent crossing of LEs causing LOB.  Had two instances of LOB in session needing min A to prevent fall.  Also note that esp with use of cane, he tends to have L foot slap despite using PLS AFO.  PT did try use of toe off AFO during session to see if this would help to provide more DF assist.  Note that it does and provides good support and is comfortable.  Will discuss with primary PT.  Pt reports that current brace is rubbing a "place" on L shin under strap.  PT assessed during session and note slight bruising and redness, but no open area.  PT placed additional padding on strap to increase barrier between skin.  Also educated for him to watch this area closely and if area does get worse, to remove brace for at least an hour at home.  Pt verbalized  understanding.      Ambulation Distance (Feet)  300 Feet    Assistive device  Rolling walker;Straight cane    Gait Pattern  Step-through pattern;Decreased dorsiflexion - left;Decreased dorsiflexion - right;Left foot flat;Trunk flexed;Poor foot clearance - left    Ambulation Surface  Level;Indoor    Gait velocity  2.73 ft/sec with cane, 2.53 ft/sec with RW        Performed below exercises during session with min cues for correct technique.    Access Code: [email or web address]  URL: [email or web address]  Date: 02/07/2019  Prepared by: Cameron Sprang   Exercises  Wide Stance with Eyes Closed - 3 sets - 30 second hold - 2x daily - 7x weekly  Wide Stance with Eyes Closed and Head Nods - 10 reps - 1 sets - 1x daily - 7x weekly  Wide Stance with Eyes Closed and Head Rotation - 10 reps - 1 sets - 1x daily - 7x weekly  Romberg Stance with Head Nods - 10 reps - 2 sets - 1x daily - 7x weekly  Romberg Stance with  Head Rotation - 10 reps - 2 sets - 1x daily - 7x weekly  Forward March - 4 reps - 1 sets - 1x daily - 7x weekly  Sit to Stand in Stride with AFO and UE Assist in LE Alignment - 10 reps - 3 sets - 1x daily - 7x weekly     PT Education - 02/07/19 1137    Education Details  see ambulation section    Person(s) Educated  Patient    Methods  Explanation    Comprehension  Verbalized understanding       PT Short Term Goals - 02/07/19 0941      PT SHORT TERM GOAL #1   Title  Pt will demonstrate independence with initial HEP    Baseline  met     Time  4    Period  Weeks    Status  Achieved    Target Date  02/04/19      PT SHORT TERM GOAL #2   Title  Pt will negotiate one step (x 2 reps) with cane and min A    Time  4    Period  Weeks    Status  Achieved    Target Date  02/04/19      PT SHORT TERM GOAL #3   Title  Pt will improve five time sit to stand from chair to </= 20 seconds with supervision from therapist    Baseline  23.19 but with min A from therapist to maintain balance; 16.5 on 2/12 but needs min assist to balance without UE assist    Time  4    Period  Weeks    Status  Not Met    Target Date  02/04/19      PT SHORT TERM GOAL #4   Title  Pt will improve BERG to >/= 29/56    Baseline  25/56; 29/56 on 02/02/19    Time  4    Period  Weeks    Status  Achieved    Target Date  02/04/19      PT SHORT TERM GOAL #5   Title  Pt will improve gait velocity with cane to >/= 2.62 ft/sec    Baseline  2.20 ft/sec with cane, 2.53 ft/sec with RW and 2.73 ft/sec on 02/07/19    Time  4    Period  Weeks    Status  Achieved  Target Date  02/04/19        PT Long Term Goals - 01/05/19 2122      PT LONG TERM GOAL #1   Title  Pt will be independent with final HEP and will report returning to [employer] and gym >/= 3 days a week    Time  8    Period  Weeks    Status  New    Target Date  03/06/19      PT LONG TERM GOAL #2   Title  Pt will ambulate 250' outside over paved  surfaces and negotiate curb (x 2) with cane and supervision    Time  8    Period  Weeks    Target Date  03/06/19      PT LONG TERM GOAL #3   Title  Pt will improve five time sit to stand to </= 17 seconds with supervision    Time  8    Period  Weeks    Target Date  03/06/19      PT LONG TERM GOAL #4   Title  Pt will improve BERG to >/= 34/56    Time  8    Period  Weeks    Status  New    Target Date  03/06/19      PT LONG TERM GOAL #5   Title  Pt will improve gait velocity with cane to >/= 2.8 ft/sec    Time  8    Period  Weeks    Status  New    Target Date  03/06/19            Plan - 02/07/19 1138    Clinical Impression Statement  Skilled session addressed remaining STGs.  Pt did meet goal for gait speed with SPC, however due to current fall risk, would NOT recommend he ambulate regularly with cane.  Note that he is using RW at all times except for when going to [employer].  PT feels he is still at high risk for falls when doing this, however would be unsafe trying to carry RW up the steps.  Also note that he continues to have significant L foot slap even with PLS brace and during session had two instances of LOB due to L foot catching.  PT trialed use of toe off brace for more support, with good success.  Will speak with primary PT about bracing options.     Rehab Potential  Good    PT Frequency  2x / week    PT Duration  8 weeks    PT Treatment/Interventions  ADLs/Self Care Home Management;Aquatic Therapy;Electrical Stimulation;DME Instruction;Gait training;Stair training;Functional mobility training;Orthotic Fit/Training;Patient/family education;Neuromuscular re-education;Balance training;Therapeutic exercise;Therapeutic activities    PT Next Visit Plan  Raquel Sarna tried toe off AFO on LLE-see what you think (he says he used [employer] before), Assess balance without AFO on L.  Step ups on larger step; balance on ramp; pt has greatest difficulty with eyes closed, narrow BOS, compliant  surfaces, rotation/pivoting safely, SLS.  Pt tends to fall posterior.  Gait training with cane on variety of surfaces and obstacles.  Negotiation of one step with cane.    PT Home Exercise Plan  [email or web address]     Consulted and Agree with Plan of Care  Patient       Patient will benefit from skilled therapeutic intervention in order to improve the following deficits and impairments:  Abnormal gait, Decreased balance, Decreased strength, Difficulty walking, Impaired sensation  Visit Diagnosis:  Hemiplegia and hemiparesis following cerebral infarction affecting right dominant side (HCC)  Unsteadiness on feet  Other abnormalities of gait and mobility  Muscle weakness (generalized)  Other disturbances of skin sensation     Problem List Patient Active Problem List   Diagnosis Date Noted  . Gait disturbance, post-stroke   . Slow transit constipation   . Small vessel disease ([place]) 10/19/2018  . Benign essential HTN   . Stage 2 chronic kidney disease   . Sequela of lacunar infarction   . CVA (cerebral vascular accident) ([place]) 10/17/2018  . Burning sensation of the foot 04/12/2015  . Leg weakness 01/08/2015  . Carpal tunnel syndrome 12/26/2014  . Dropfoot 12/26/2014  . Neuralgia neuritis, sciatic nerve 12/26/2014  . Diabetes mellitus ([place]) 08/24/2014  . BP (high blood pressure) 08/24/2014  . HLD (hyperlipidemia) 08/24/2014  . H/O coronary artery bypass surgery 08/24/2014    Cameron Sprang, PT, MPT [facility] [address] [place], [place], [address] Phone: [phone]   Fax:  [phone] 02/07/19, 11:42 AM  Name: Zachary Spence MRN: [id number] Date of Birth: August 03, 1923

## 2019-02-11 ENCOUNTER — Ambulatory Visit: Payer: Medicare Other | Admitting: Physical Therapy

## 2019-02-11 ENCOUNTER — Telehealth: Payer: Self-pay | Admitting: Physical Therapy

## 2019-02-11 ENCOUNTER — Encounter: Payer: Self-pay | Admitting: Physical Therapy

## 2019-02-11 DIAGNOSIS — I69351 Hemiplegia and hemiparesis following cerebral infarction affecting right dominant side: Secondary | ICD-10-CM

## 2019-02-11 DIAGNOSIS — M21372 Foot drop, left foot: Secondary | ICD-10-CM

## 2019-02-11 DIAGNOSIS — R296 Repeated falls: Secondary | ICD-10-CM

## 2019-02-11 DIAGNOSIS — R2689 Other abnormalities of gait and mobility: Secondary | ICD-10-CM

## 2019-02-11 DIAGNOSIS — R2681 Unsteadiness on feet: Secondary | ICD-10-CM

## 2019-02-11 DIAGNOSIS — R208 Other disturbances of skin sensation: Secondary | ICD-10-CM

## 2019-02-11 DIAGNOSIS — M6281 Muscle weakness (generalized): Secondary | ICD-10-CM

## 2019-02-11 NOTE — Telephone Encounter (Signed)
Dr. Letta Pate, We have been assessing Zachary Spence for a new L AFO following his CVA due to worsening foot slap and loss of balance.  He currently has a posterior leaf spring from [employer].  We feel he will benefit from use of ground reaction AFO in order to improve safety with functional mobility.    If you agree, please submit request in EPIC under MD Order, Other Orders (list ground reaction AFO in comments) or fax to [facility] at [phone].   Thank you, Rico Junker, PT, DPT 02/11/19    10:51 AM    [facility] [address] [place], [place]  [address] Phone:  [phone] Fax:  [phone]

## 2019-02-11 NOTE — Therapy (Signed)
[facility] [address] [place], [place], [address] Phone: [phone]   Fax:  [phone]  Physical Therapy Treatment  Patient Details  Name: Zachary Spence MRN: [id number] Date of Birth: 1923-01-01 Referring Provider (PT): Charlett Blake, MD   Encounter Date: 02/11/2019  PT End of Session - 02/11/19 1038    Visit Number  9    Number of Visits  17    Date for PT Re-Evaluation  03/06/19    Authorization Type  Medicare and AARP -covered 100%, 10th visit PN    PT Start Time  0935    PT Stop Time  1016    PT Time Calculation (min)  41 min    Activity Tolerance  Patient tolerated treatment well    Behavior During Therapy  [facility] for tasks assessed/performed       Past Medical History:  Diagnosis Date  . Bowel obstruction ([place])   . Diabetes mellitus without complication ([place])   . Hypertension   . Non Hodgkin's lymphoma ([place])   . Stroke [facility])     Past Surgical History:  Procedure Laterality Date  . APPENDECTOMY    . CORONARY ARTERY BYPASS GRAFT    . SPLENECTOMY      There were no vitals filed for this visit.  Subjective Assessment - 02/11/19 0938    Subjective  No falls or issues to report; performing HEP 2x/day.  New padding on PLS feels comfortable.    Pertinent History  Non-Hodgkin's lymphoma, HTN, DM with bilat peripheral neuropathy with L foot slap - wears a posterior leaf spring AFO x 4 years, bilat carpal tunnel, CABG and falls    Limitations  Walking    Patient Stated Goals  To improve his balance and return to using the cane mostly, return to going to [employer] daily and working out at [employer] x 1 hour daily    Currently in Pain?  No/denies                       [facility] Adult PT Treatment/Exercise - 02/11/19 1033      Ambulation/Gait   Ambulation/Gait  Yes    Ambulation/Gait Assistance  4: Min guard    Ambulation/Gait Assistance Details  performed gait trials with RW and then  with SPC while assessing two different ground reaction AFO - one with medial strut, the other with a lateral strut.  Lateral strut noted to be pressing into lateral malleolus and too rigid for patient.  Medial strut AFO noted to provide improved DF assistance, mitigation of foot slap and decreased posterior LOB during gait.      Ambulation Distance (Feet)  300 Feet    Assistive device  Rolling walker;Straight cane    Ambulation Surface  Level;Indoor    Stairs  Yes    Stairs Assistance  5: Supervision    Stairs Assistance Details (indicate cue type and reason)  with cane and one rail, trial of [place] ground reaction AFO.  Reciprocal pattern to ascend, step to to descend    Stair Management Technique  One rail Left;Alternating pattern;Step to pattern;Forwards;With cane    Number of Stairs  4    Height of Stairs  6    Ramp  4: Min assist    Ramp Details (indicate cue type and reason)  with [place] ground reaction AFO and cane    Curb  4: Min assist    Curb Details (indicate cue type and  reason)  with cane and ground reaction AFO, no posterior LOB          Balance Exercises - 02/11/19 1011      Balance Exercises: Standing   Wall Bumps  Hip    Wall Bumps-Hips  Eyes opened;Eyes closed;Anterior/posterior;10 reps   supervision for EO, min-mod A for eyes closed       PT Education - 02/11/19 1037    Education Details  trial of AFO, areas of continued focus based on son's input, shoe choice     Person(s) Educated  Patient;Child(ren)    Methods  Explanation;Demonstration    Comprehension  Verbalized understanding;Returned demonstration       PT Short Term Goals - 02/07/19 0941      PT SHORT TERM GOAL #1   Title  Pt will demonstrate independence with initial HEP    Baseline  met     Time  4    Period  Weeks    Status  Achieved    Target Date  02/04/19      PT SHORT TERM GOAL #2   Title  Pt will negotiate one step (x 2 reps) with cane and min A    Time  4    Period  Weeks     Status  Achieved    Target Date  02/04/19      PT SHORT TERM GOAL #3   Title  Pt will improve five time sit to stand from chair to </= 20 seconds with supervision from therapist    Baseline  23.19 but with min A from therapist to maintain balance; 16.5 on 2/12 but needs min assist to balance without UE assist    Time  4    Period  Weeks    Status  Not Met    Target Date  02/04/19      PT SHORT TERM GOAL #4   Title  Pt will improve BERG to >/= 29/56    Baseline  25/56; 29/56 on 02/02/19    Time  4    Period  Weeks    Status  Achieved    Target Date  02/04/19      PT SHORT TERM GOAL #5   Title  Pt will improve gait velocity with cane to >/= 2.62 ft/sec    Baseline  2.20 ft/sec with cane, 2.53 ft/sec with RW and 2.73 ft/sec on 02/07/19    Time  4    Period  Weeks    Status  Achieved    Target Date  02/04/19        PT Long Term Goals - 01/05/19 2122      PT LONG TERM GOAL #1   Title  Pt will be independent with final HEP and will report returning to [employer] and gym >/= 3 days a week    Time  8    Period  Weeks    Status  New    Target Date  03/06/19      PT LONG TERM GOAL #2   Title  Pt will ambulate 250' outside over paved surfaces and negotiate curb (x 2) with cane and supervision    Time  8    Period  Weeks    Target Date  03/06/19      PT LONG TERM GOAL #3   Title  Pt will improve five time sit to stand to </= 17 seconds with supervision    Time  8    Period  Weeks    Target Date  03/06/19      PT LONG TERM GOAL #4   Title  Pt will improve BERG to >/= 34/56    Time  8    Period  Weeks    Status  New    Target Date  03/06/19      PT LONG TERM GOAL #5   Title  Pt will improve gait velocity with cane to >/= 2.8 ft/sec    Time  8    Period  Weeks    Status  New    Target Date  03/06/19            Plan - 02/11/19 1038    Clinical Impression Statement  Treatment session with son present focused on assessment of gait and with two different ground  reaction AFO.  Ott[place]ock ground reaction AFO with medial strut provided best support, more efficient and safe gait pattern and mitigated foot slap.  Pt agreeable to new AFO.  Continued balance training with focus on use of hip strategy with and without use of vision; pt requires mod A for balance when EC.  Discussed shoe options with son and other areas to focus on in therapy (gym and negotiating stairs without rails at basketball games.)  Will continue to address and progress towards LTG.    Rehab Potential  Good    PT Frequency  2x / week    PT Duration  8 weeks    PT Treatment/Interventions  ADLs/Self Care Home Management;Aquatic Therapy;Electrical Stimulation;DME Instruction;Gait training;Stair training;Functional mobility training;Orthotic Fit/Training;Patient/family education;Neuromuscular re-education;Balance training;Therapeutic exercise;Therapeutic activities    PT Next Visit Plan  10th visit PN!  Has order for new AFO come in?  Need to contact [employer] about getting a new [place] ground reaction AFO.  Work on stairs without rail but with cane to simulate going to basketball games.  Discuss common gym activities and when it would be safe for pt to return to gym.  Step ups on larger step; balance on ramp; pt has greatest difficulty with eyes closed, narrow BOS, compliant surfaces, rotation/pivoting safely, SLS.  Pt tends to fall posterior.  Gait training with cane on variety of surfaces and obstacles.  Negotiation of one step with cane.    PT Home Exercise Plan  [email or web address]     Consulted and Agree with Plan of Care  Patient       Patient will benefit from skilled therapeutic intervention in order to improve the following deficits and impairments:  Abnormal gait, Decreased balance, Decreased strength, Difficulty walking, Impaired sensation  Visit Diagnosis: Hemiplegia and hemiparesis following cerebral infarction affecting right dominant side (HCC)  Unsteadiness on feet  Other abnormalities of  gait and mobility  Muscle weakness (generalized)  Other disturbances of skin sensation  Repeated falls     Problem List Patient Active Problem List   Diagnosis Date Noted  . Gait disturbance, post-stroke   . Slow transit constipation   . Small vessel disease ([place]) 10/19/2018  . Benign essential HTN   . Stage 2 chronic kidney disease   . Sequela of lacunar infarction   . CVA (cerebral vascular accident) ([place]) 10/17/2018  . Burning sensation of the foot 04/12/2015  . Leg weakness 01/08/2015  . Carpal tunnel syndrome 12/26/2014  . Dropfoot 12/26/2014  . Neuralgia neuritis, sciatic nerve 12/26/2014  . Diabetes mellitus ([place]) 08/24/2014  . BP (high blood pressure) 08/24/2014  . HLD (hyperlipidemia) 08/24/2014  . H/O coronary artery bypass surgery 08/24/2014  Rico Junker, PT, DPT 02/11/19    10:47 AM    [facility] [address] [place], [place], [address] Phone: [phone]   Fax:  [phone]  Name: COTTON BECKLEY MRN: [id number] Date of Birth: 1923/10/16

## 2019-02-14 ENCOUNTER — Ambulatory Visit: Payer: Medicare Other | Admitting: Physical Therapy

## 2019-02-14 ENCOUNTER — Encounter: Payer: Self-pay | Admitting: Physical Therapy

## 2019-02-14 DIAGNOSIS — M21372 Foot drop, left foot: Secondary | ICD-10-CM

## 2019-02-14 DIAGNOSIS — R2681 Unsteadiness on feet: Secondary | ICD-10-CM

## 2019-02-14 DIAGNOSIS — M6281 Muscle weakness (generalized): Secondary | ICD-10-CM

## 2019-02-14 DIAGNOSIS — I69351 Hemiplegia and hemiparesis following cerebral infarction affecting right dominant side: Secondary | ICD-10-CM

## 2019-02-14 DIAGNOSIS — R296 Repeated falls: Secondary | ICD-10-CM

## 2019-02-14 DIAGNOSIS — R2689 Other abnormalities of gait and mobility: Secondary | ICD-10-CM

## 2019-02-14 DIAGNOSIS — R208 Other disturbances of skin sensation: Secondary | ICD-10-CM

## 2019-02-14 NOTE — Therapy (Signed)
[facility] [address] [place], [place], [address] Phone: [phone]   Fax:  [phone]  Physical Therapy Treatment and 10th Visit Progress Note   Patient Details  Name: Zachary Spence MRN: [id number] Date of Birth: 13-Sep-1923 Referring Provider (PT): Charlett Blake, MD   Encounter Date: 02/14/2019  PT End of Session - 02/14/19 1654    Visit Number  10    Number of Visits  17    Date for PT Re-Evaluation  03/06/19    Authorization Type  Medicare and [employer] -covered 100%, 10th visit PN    PT Start Time  0926    PT Stop Time  1018    PT Time Calculation (min)  52 min    Activity Tolerance  Patient tolerated treatment well    Behavior During Therapy  [facility] for tasks assessed/performed       Past Medical History:  Diagnosis Date  . Bowel obstruction ([place])   . Diabetes mellitus without complication ([place])   . Hypertension   . Non Hodgkin's lymphoma ([place])   . Stroke [facility])     Past Surgical History:  Procedure Laterality Date  . APPENDECTOMY    . CORONARY ARTERY BYPASS GRAFT    . SPLENECTOMY      There were no vitals filed for this visit.  Subjective Assessment - 02/14/19 0930    Subjective  Got new shoes yesterday, more stiff.  No issues over the weekend.    Pertinent History  Non-Hodgkin's lymphoma, HTN, DM with bilat peripheral neuropathy with L foot slap - wears a posterior leaf spring AFO x 4 years, bilat carpal tunnel, CABG and falls    Limitations  Walking    Patient Stated Goals  To improve his balance and return to using the cane mostly, return to going to [employer] daily and working out at [employer] x 1 hour daily    Currently in Pain?  No/denies                       [facility] Adult PT Treatment/Exercise - 02/14/19 0931      Ambulation/Gait   Ambulation/Gait  Yes    Ambulation/Gait Assistance  5: Supervision    Ambulation/Gait Assistance Details  with new shoes continued to  assess gait pattern with trial [place] ground reaction AFO; also added T strap on medial side to decrease extra ankle supination.  Pt continued to demonstrate decreased foot slap and improved safety during gait.    Ambulation Distance (Feet)  115 Feet    Assistive device  Rolling walker;Straight cane    Gait Pattern  Step-through pattern;Decreased dorsiflexion - left;Trunk flexed    Ambulation Surface  Level;Indoor    Stairs  Yes    Stairs Assistance  5: Supervision;4: Min assist    Stairs Assistance Details (indicate cue type and reason)  with cane and rail for first 3 reps and then with cane only for last two sets.  Supervision with rail, min A without rail.  Verbal cues for safe descending sequence.  Alternating to ascend, step to to descend with rail.  Step to with cane only    Stair Management Technique  One rail Left;One rail Right;Alternating pattern;Step to pattern;Forwards;With cane    Number of Stairs  20    Height of Stairs  6      Therapeutic Activites    Therapeutic Activities  ADL's    ADL's  Practiced sequence for donning  pre-tibial AFO with min A and min verbal cues.  Recommended use of shoe horn to decrease pt slamming and jamming foot into shoe      Knee/Hip Exercises: Standing   Forward Step Up  Right;Left;1 set;10 reps;Hand Hold: 1;Step Height: 4"    Forward Step Up Limitations  holding cane for support on R; min A with verbal and tactile cues for forward weight shift    Step Down  Left;1 set;10 reps;Hand Hold: 1;Step Height: 4"    Step Down Limitations  step down with LLE only due to AFO; one UE support on cane and min A for balance during forward and backwards weight shift.  Still requires assistance for control of weight shifting and due to frequent posterior LOB          Balance Exercises - 02/14/19 1014      Balance Exercises: Standing   Stepping Strategy  Posterior;UE support;10 reps   with cane; stepping back with R and LLE       PT Education - 02/14/19  1653    Education Details  process of obtaining AFO, balance reaction training    Person(s) Educated  Patient    Methods  Explanation    Comprehension  Verbalized understanding       PT Short Term Goals - 02/07/19 0941      PT SHORT TERM GOAL #1   Title  Pt will demonstrate independence with initial HEP    Baseline  met     Time  4    Period  Weeks    Status  Achieved    Target Date  02/04/19      PT SHORT TERM GOAL #2   Title  Pt will negotiate one step (x 2 reps) with cane and min A    Time  4    Period  Weeks    Status  Achieved    Target Date  02/04/19      PT SHORT TERM GOAL #3   Title  Pt will improve five time sit to stand from chair to </= 20 seconds with supervision from therapist    Baseline  23.19 but with min A from therapist to maintain balance; 16.5 on 2/12 but needs min assist to balance without UE assist    Time  4    Period  Weeks    Status  Not Met    Target Date  02/04/19      PT SHORT TERM GOAL #4   Title  Pt will improve BERG to >/= 29/56    Baseline  25/56; 29/56 on 02/02/19    Time  4    Period  Weeks    Status  Achieved    Target Date  02/04/19      PT SHORT TERM GOAL #5   Title  Pt will improve gait velocity with cane to >/= 2.62 ft/sec    Baseline  2.20 ft/sec with cane, 2.53 ft/sec with RW and 2.73 ft/sec on 02/07/19    Time  4    Period  Weeks    Status  Achieved    Target Date  02/04/19        PT Long Term Goals - 01/05/19 2122      PT LONG TERM GOAL #1   Title  Pt will be independent with final HEP and will report returning to [employer] and gym >/= 3 days a week    Time  8    Period  Weeks    Status  New    Target Date  03/06/19      PT LONG TERM GOAL #2   Title  Pt will ambulate 250' outside over paved surfaces and negotiate curb (x 2) with cane and supervision    Time  8    Period  Weeks    Target Date  03/06/19      PT LONG TERM GOAL #3   Title  Pt will improve five time sit to stand to </= 17 seconds with  supervision    Time  8    Period  Weeks    Target Date  03/06/19      PT LONG TERM GOAL #4   Title  Pt will improve BERG to >/= 34/56    Time  8    Period  Weeks    Status  New    Target Date  03/06/19      PT LONG TERM GOAL #5   Title  Pt will improve gait velocity with cane to >/= 2.8 ft/sec    Time  8    Period  Weeks    Status  New    Target Date  03/06/19            Plan - 02/14/19 1654    Clinical Impression Statement  This note covers treatment sessions from 01-05-2019 through 02-14-2019.   With patient's new shoes continued to trial pre-tibial ground reaction AFO for gait with rolling walker and gait with cane over level surfaces and when negotiating stairs with rail and with cane only.  Also added T-strap for increased ankle support/control.  When negotiating stairs with decreased UE support pt continued to demonstrated posterior LOB.  Continued to address balance with step ups, step downs and stepping strategy training.  Pt is making good progress towards goals and will continue with POC to obtain a new AFO due to changes in gait following CVA, gait training with new AFO and balance training to reduce falls risk.    Rehab Potential  Good    PT Frequency  2x / week    PT Duration  8 weeks    PT Treatment/Interventions  ADLs/Self Care Home Management;Aquatic Therapy;Electrical Stimulation;DME Instruction;Gait training;Stair training;Functional mobility training;Orthotic Fit/Training;Patient/family education;Neuromuscular re-education;Balance training;Therapeutic exercise;Therapeutic activities    PT Next Visit Plan  Has Dr. Letta Pate addended face to face note to justify new AFO?/need to schedule appointment with [employer] for new AFO.   Work on stairs without rail but with cane to simulate going to basketball games.  Discuss common gym activities and when it would be safe for pt to return to gym.  Step ups on larger step; balance on ramp; pt has greatest difficulty with eyes  closed, narrow BOS, compliant surfaces, rotation/pivoting safely, SLS.  Pt tends to fall posterior.  Gait training with cane on variety of surfaces and obstacles.  Negotiation of one step with cane.   Stepping strategy, especially backwards    PT Home Exercise Plan  [id number]     Consulted and Agree with Plan of Care  Patient       Patient will benefit from skilled therapeutic intervention in order to improve the following deficits and impairments:  Abnormal gait, Decreased balance, Decreased strength, Difficulty walking, Impaired sensation  Visit Diagnosis: Foot drop, left foot  Hemiplegia and hemiparesis following cerebral infarction affecting right dominant side (HCC)  Unsteadiness on feet  Other abnormalities of gait and mobility  Muscle weakness (generalized)  Other  disturbances of skin sensation  Repeated falls     Problem List Patient Active Problem List   Diagnosis Date Noted  . Gait disturbance, post-stroke   . Slow transit constipation   . Small vessel disease ([place]) 10/19/2018  . Benign essential HTN   . Stage 2 chronic kidney disease   . Sequela of lacunar infarction   . CVA (cerebral vascular accident) ([place]) 10/17/2018  . Burning sensation of the foot 04/12/2015  . Leg weakness 01/08/2015  . Carpal tunnel syndrome 12/26/2014  . Dropfoot 12/26/2014  . Neuralgia neuritis, sciatic nerve 12/26/2014  . Diabetes mellitus ([place]) 08/24/2014  . BP (high blood pressure) 08/24/2014  . HLD (hyperlipidemia) 08/24/2014  . H/O coronary artery bypass surgery 08/24/2014    Rico Junker, PT, DPT 02/14/19    5:00 PM    [facility] [address] [place], [place], [address] Phone: [phone]   Fax:  [phone]  Name: Zachary Spence MRN: [id number] Date of Birth: December 07, 1923

## 2019-02-16 ENCOUNTER — Ambulatory Visit: Payer: Medicare Other | Admitting: Physical Therapy

## 2019-02-16 ENCOUNTER — Encounter: Payer: Self-pay | Admitting: Physical Therapy

## 2019-02-16 DIAGNOSIS — M6281 Muscle weakness (generalized): Secondary | ICD-10-CM

## 2019-02-16 DIAGNOSIS — I69351 Hemiplegia and hemiparesis following cerebral infarction affecting right dominant side: Secondary | ICD-10-CM | POA: Diagnosis not present

## 2019-02-16 DIAGNOSIS — R296 Repeated falls: Secondary | ICD-10-CM

## 2019-02-16 DIAGNOSIS — R2689 Other abnormalities of gait and mobility: Secondary | ICD-10-CM

## 2019-02-16 DIAGNOSIS — R2681 Unsteadiness on feet: Secondary | ICD-10-CM

## 2019-02-16 NOTE — Therapy (Signed)
[facility] [address] [place], [place], [address] Phone: [phone]   Fax:  [phone]  Physical Therapy Treatment  Patient Details  Name: Zachary Spence MRN: [id number] Date of Birth: 1922-12-27 Referring Provider (PT): Charlett Blake, MD   Encounter Date: 02/16/2019  PT End of Session - 02/16/19 1031    Visit Number  11    Number of Visits  17    Date for PT Re-Evaluation  03/06/19    Authorization Type  Medicare and AARP -covered 100%, 10th visit PN    PT Start Time  0936    PT Stop Time  1016    PT Time Calculation (min)  40 min    Equipment Utilized During Treatment  Gait belt    Activity Tolerance  Patient tolerated treatment well    Behavior During Therapy  [facility] for tasks assessed/performed       Past Medical History:  Diagnosis Date  . Bowel obstruction ([place])   . Diabetes mellitus without complication ([place])   . Hypertension   . Non Hodgkin's lymphoma ([place])   . Stroke [facility])     Past Surgical History:  Procedure Laterality Date  . APPENDECTOMY    . CORONARY ARTERY BYPASS GRAFT    . SPLENECTOMY      There were no vitals filed for this visit.  Subjective Assessment - 02/16/19 0939    Subjective  Denies falls or changes.  Reports new shoes are working "alright".    Pertinent History  Non-Hodgkin's lymphoma, HTN, DM with bilat peripheral neuropathy with L foot slap - wears a posterior leaf spring AFO x 4 years, bilat carpal tunnel, CABG and falls    Limitations  Walking    Patient Stated Goals  To improve his balance and return to using the cane mostly, return to going to [employer] daily and working out at [employer] x 1 hour daily    Currently in Pain?  No/denies        [facility] Adult PT Treatment/Exercise - 02/16/19 0001      Transfers   Transfers  Sit to Stand;Stand to Sit    Sit to Stand  6: Modified independent (Device/Increase time);With upper extremity assist;From  chair/3-in-1;Multiple attempts    Stand to Sit  6: Modified independent (Device/Increase time);With upper extremity assist;Uncontrolled descent;To chair/3-in-1      Ambulation/Gait   Ambulation/Gait  Yes    Ambulation/Gait Assistance  5: Supervision    Ambulation Distance (Feet)  110 Feet   x 3 and through gym for activities   Assistive device  Rolling walker;Straight cane    Gait Pattern  Step-through pattern;Decreased dorsiflexion - left;Trunk flexed    Ambulation Surface  Level;Indoor    Stairs  Yes    Stairs Assistance  5: Supervision    Stairs Assistance Details (indicate cue type and reason)  practiced with cane and 1 rail then just cane;min assist without railing and just cane.  Alternating going up and down.    Stair Management Technique  One rail Left;One rail Right;Alternating pattern;Step to pattern;Forwards;With cane    Number of Stairs  24    Height of Stairs  6      Exercises   Exercises  Ankle      Knee/Hip Exercises: Aerobic   Other Aerobic  Scifit level 3.0 bil LE's x 5 minutes for strength/endurance      Ankle Exercises: Stretches   Gastroc Stretch  2 reps;60 seconds;Other (comment)  at edge of step with heel off step and bil UE support         Balance Exercises - 02/16/19 1030      Balance Exercises: Standing   Standing Eyes Closed  Wide (BOA);Foam/compliant surface;5 reps;10 secs   on blue foam in // bars.  Using UE's >80% of time to recover   SLS  Eyes open;Other reps (comment);Other (comment)   Taps to 6" step alternating LE's with cane & 1 rail 20 reps   Wall Bumps  Hip    Wall Bumps-Hips  Eyes opened;Anterior/posterior;10 reps    Step Ups  Forward;6 inch;UE support 1   x 20 reps on 6" then x 20 on 8"   Other Standing Exercises  PWR! Up x 20 for mild dynamic balance in // bars.  PWR! rock x  20 for lateral weight shift then x 20 for laternal with heel raise on opposite then x 20 lifting opposite leg 4" off floor.  Repeated same on blue foam mat.     Overall Comments  Other (comment)   Alternating LE taps to 8" step 20 reps with cane/rail         PT Short Term Goals - 02/07/19 0941      PT SHORT TERM GOAL #1   Title  Pt will demonstrate independence with initial HEP    Baseline  met     Time  4    Period  Weeks    Status  Achieved    Target Date  02/04/19      PT SHORT TERM GOAL #2   Title  Pt will negotiate one step (x 2 reps) with cane and min A    Time  4    Period  Weeks    Status  Achieved    Target Date  02/04/19      PT SHORT TERM GOAL #3   Title  Pt will improve five time sit to stand from chair to </= 20 seconds with supervision from therapist    Baseline  23.19 but with min A from therapist to maintain balance; 16.5 on 2/12 but needs min assist to balance without UE assist    Time  4    Period  Weeks    Status  Not Met    Target Date  02/04/19      PT SHORT TERM GOAL #4   Title  Pt will improve BERG to >/= 29/56    Baseline  25/56; 29/56 on 02/02/19    Time  4    Period  Weeks    Status  Achieved    Target Date  02/04/19      PT SHORT TERM GOAL #5   Title  Pt will improve gait velocity with cane to >/= 2.62 ft/sec    Baseline  2.20 ft/sec with cane, 2.53 ft/sec with RW and 2.73 ft/sec on 02/07/19    Time  4    Period  Weeks    Status  Achieved    Target Date  02/04/19        PT Long Term Goals - 01/05/19 2122      PT LONG TERM GOAL #1   Title  Pt will be independent with final HEP and will report returning to [employer] and gym >/= 3 days a week    Time  8    Period  Weeks    Status  New    Target Date  03/06/19  PT LONG TERM GOAL #2   Title  Pt will ambulate 250' outside over paved surfaces and negotiate curb (x 2) with cane and supervision    Time  8    Period  Weeks    Target Date  03/06/19      PT LONG TERM GOAL #3   Title  Pt will improve five time sit to stand to </= 17 seconds with supervision    Time  8    Period  Weeks    Target Date  03/06/19      PT LONG TERM GOAL #4    Title  Pt will improve BERG to >/= 34/56    Time  8    Period  Weeks    Status  New    Target Date  03/06/19      PT LONG TERM GOAL #5   Title  Pt will improve gait velocity with cane to >/= 2.8 ft/sec    Time  8    Period  Weeks    Status  New    Target Date  03/06/19            Plan - 02/16/19 1031    Clinical Impression Statement  Skilled session focused on strength and balance.  Pt continues with LOB posteriorly frequently during session.  Works hard during session with no significant rest breaks needed.  MD has not replied to request for possible new AFO.  continue PT per POC.    Rehab Potential  Good    PT Frequency  2x / week    PT Duration  8 weeks    PT Treatment/Interventions  ADLs/Self Care Home Management;Aquatic Therapy;Electrical Stimulation;DME Instruction;Gait training;Stair training;Functional mobility training;Orthotic Fit/Training;Patient/family education;Neuromuscular re-education;Balance training;Therapeutic exercise;Therapeutic activities    PT Next Visit Plan  Has Dr. Letta Pate addended face to face note to justify new AFO?/need to schedule appointment with [employer] for new AFO.   Work on stairs without rail but with cane to simulate going to basketball games.  Discuss common gym activities and when it would be safe for pt to return to gym.  Step ups on larger step; balance on ramp; pt has greatest difficulty with eyes closed, narrow BOS, compliant surfaces, rotation/pivoting safely, SLS.  Pt tends to fall posterior.  Gait training with cane on variety of surfaces and obstacles.  Negotiation of one step with cane.   Stepping strategy, especially backwards    PT Home Exercise Plan  [id number]     Consulted and Agree with Plan of Care  Patient       Patient will benefit from skilled therapeutic intervention in order to improve the following deficits and impairments:  Abnormal gait, Decreased balance, Decreased strength, Difficulty walking, Impaired  sensation  Visit Diagnosis: Hemiplegia and hemiparesis following cerebral infarction affecting right dominant side (HCC)  Unsteadiness on feet  Other abnormalities of gait and mobility  Muscle weakness (generalized)  Repeated falls     Problem List Patient Active Problem List   Diagnosis Date Noted  . Gait disturbance, post-stroke   . Slow transit constipation   . Small vessel disease ([place]) 10/19/2018  . Benign essential HTN   . Stage 2 chronic kidney disease   . Sequela of lacunar infarction   . CVA (cerebral vascular accident) ([place]) 10/17/2018  . Burning sensation of the foot 04/12/2015  . Leg weakness 01/08/2015  . Carpal tunnel syndrome 12/26/2014  . Dropfoot 12/26/2014  . Neuralgia neuritis, sciatic nerve 12/26/2014  . Diabetes mellitus (  [place]) 08/24/2014  . BP (high blood pressure) 08/24/2014  . HLD (hyperlipidemia) 08/24/2014  . H/O coronary artery bypass surgery 08/24/2014    Narda Bonds, PTA [facility] 02/16/19 10:35 AM Phone: [phone] Fax: [facility] [address] [place], [place], [address] Phone: [phone]   Fax:  [phone]  Name: Zachary Spence MRN: [id number] Date of Birth: 07-06-1923

## 2019-02-21 ENCOUNTER — Ambulatory Visit: Payer: Medicare Other | Attending: Physical Medicine & Rehabilitation | Admitting: Physical Therapy

## 2019-02-21 ENCOUNTER — Encounter: Payer: Self-pay | Admitting: Physical Therapy

## 2019-02-21 DIAGNOSIS — R2689 Other abnormalities of gait and mobility: Secondary | ICD-10-CM | POA: Diagnosis present

## 2019-02-21 DIAGNOSIS — R296 Repeated falls: Secondary | ICD-10-CM | POA: Diagnosis present

## 2019-02-21 DIAGNOSIS — R208 Other disturbances of skin sensation: Secondary | ICD-10-CM | POA: Diagnosis present

## 2019-02-21 DIAGNOSIS — I69351 Hemiplegia and hemiparesis following cerebral infarction affecting right dominant side: Secondary | ICD-10-CM | POA: Diagnosis present

## 2019-02-21 DIAGNOSIS — M6281 Muscle weakness (generalized): Secondary | ICD-10-CM

## 2019-02-21 DIAGNOSIS — M21372 Foot drop, left foot: Secondary | ICD-10-CM

## 2019-02-21 DIAGNOSIS — R2681 Unsteadiness on feet: Secondary | ICD-10-CM | POA: Diagnosis present

## 2019-02-21 NOTE — Therapy (Signed)
[place] [address] [place], [place], [address] Phone: [phone]   Fax:  [phone]  Physical Therapy Treatment  Patient Details  Name: SIMUEL STEBNER MRN: [id number] Date of Birth: Mar 12, 1923 Referring Provider (PT): Charlett Blake, MD   Encounter Date: 02/21/2019  PT End of Session - 02/21/19 1239    Visit Number  12    Number of Visits  17    Date for PT Re-Evaluation  03/06/19    Authorization Type  Medicare and AARP -covered 100%, 10th visit PN    PT Start Time  0935    PT Stop Time  1016    PT Time Calculation (min)  41 min    Activity Tolerance  Patient tolerated treatment well    Behavior During Therapy  [facility] for tasks assessed/performed       Past Medical History:  Diagnosis Date  . Bowel obstruction ([place])   . Diabetes mellitus without complication ([place])   . Hypertension   . Non Hodgkin's lymphoma ([place])   . Stroke [facility])     Past Surgical History:  Procedure Laterality Date  . APPENDECTOMY    . CORONARY ARTERY BYPASS GRAFT    . SPLENECTOMY      There were no vitals filed for this visit.  Subjective Assessment - 02/21/19 0938    Subjective  No issues over the weekend.  Doing well today.    Pertinent History  Non-Hodgkin's lymphoma, HTN, DM with bilat peripheral neuropathy with L foot slap - wears a posterior leaf spring AFO x 4 years, bilat carpal tunnel, CABG and falls    Limitations  Walking    Patient Stated Goals  To improve his balance and return to using the cane mostly, return to going to [employer] daily and working out at [employer] x 1 hour daily    Currently in Pain?  No/denies                       [facility] Adult PT Treatment/Exercise - 02/21/19 8850      Therapeutic Activites    Therapeutic Activities  Other Therapeutic Activities    Other Therapeutic Activities  Assisted pt with getting appointment set up with [employer] to be assessed for new AFO.   Appointment is tomorrow with [employer].  Will discuss specific recommendations with orthotist via phone prior to appointment.        Exercises   Exercises  Other Exercises    Other Exercises   Discussed with pt his typical gym routine: pt performs seated at machines: bicep curls, tricep extensions, back extension, treadmill x 15 minutes at 3% grade and 106mh, seated ABD/ADD hip, 10lb free weights in standing for shoulder flexion, ABD and over head press, and tall kneeling resisted twists.  Had pt demonstrate UE exercises with 8lb free weights in standing but required min A for balance while performing due to posterior LOB.  Discussed safe places to stand at gym.  Pt states he can stand with his back to the weight rack but discussed how this would not be safe if he were to lose his balance backwards and would likely get injured.  Discussed and had pt demonstrate exercises in sitting without back support.  Also set pt up at weight machine to perform bicep and tricep without any issues.             PT Education - 02/21/19 1238    Education Details  appointment with [employer]	[place].  Gym routine and safety recommendations    Person(s) Educated  Patient    Methods  Explanation;Demonstration    Comprehension  Need further instruction       PT Short Term Goals - 02/07/19 0941      PT SHORT TERM GOAL #1   Title  Pt will demonstrate independence with initial HEP    Baseline  met     Time  4    Period  Weeks    Status  Achieved    Target Date  02/04/19      PT SHORT TERM GOAL #2   Title  Pt will negotiate one step (x 2 reps) with cane and min A    Time  4    Period  Weeks    Status  Achieved    Target Date  02/04/19      PT SHORT TERM GOAL #3   Title  Pt will improve five time sit to stand from chair to </= 20 seconds with supervision from therapist    Baseline  23.19 but with min A from therapist to maintain balance; 16.5 on 2/12 but needs min assist to balance without UE assist     Time  4    Period  Weeks    Status  Not Met    Target Date  02/04/19      PT SHORT TERM GOAL #4   Title  Pt will improve BERG to >/= 29/56    Baseline  25/56; 29/56 on 02/02/19    Time  4    Period  Weeks    Status  Achieved    Target Date  02/04/19      PT SHORT TERM GOAL #5   Title  Pt will improve gait velocity with cane to >/= 2.62 ft/sec    Baseline  2.20 ft/sec with cane, 2.53 ft/sec with RW and 2.73 ft/sec on 02/07/19    Time  4    Period  Weeks    Status  Achieved    Target Date  02/04/19        PT Long Term Goals - 01/05/19 2122      PT LONG TERM GOAL #1   Title  Pt will be independent with final HEP and will report returning to [employer] and gym >/= 3 days a week    Time  8    Period  Weeks    Status  New    Target Date  03/06/19      PT LONG TERM GOAL #2   Title  Pt will ambulate 250' outside over paved surfaces and negotiate curb (x 2) with cane and supervision    Time  8    Period  Weeks    Target Date  03/06/19      PT LONG TERM GOAL #3   Title  Pt will improve five time sit to stand to </= 17 seconds with supervision    Time  8    Period  Weeks    Target Date  03/06/19      PT LONG TERM GOAL #4   Title  Pt will improve BERG to >/= 34/56    Time  8    Period  Weeks    Status  New    Target Date  03/06/19      PT LONG TERM GOAL #5   Title  Pt will improve gait velocity with cane to >/= 2.8 ft/sec  Time  8    Period  Weeks    Status  New    Target Date  03/06/19            Plan - 02/21/19 1239    Clinical Impression Statement  Treatment session focused on assisting pt with getting set up for AFO assessment with [employer] and beginning to review pt's gym routine.  Pt will likely need to decrease weight when he is cleared to start back to gym and may need to perform exercises in sitting due to posterior LOB.  Will continue to address and progress towards LTG and safe return to gym    Rehab Potential  Good    PT Frequency  2x / week    PT  Duration  8 weeks    PT Treatment/Interventions  ADLs/Self Care Home Management;Aquatic Therapy;Electrical Stimulation;DME Instruction;Gait training;Stair training;Functional mobility training;Orthotic Fit/Training;Patient/family education;Neuromuscular re-education;Balance training;Therapeutic exercise;Therapeutic activities    PT Next Visit Plan  Pt went for AFO assessment on Tuesday.  Continue to review gym routine - sticky note on my desk.  Stepping strategy especially backwards.  Gait training when he receives his new AFO.  Stair negotiation with cane but no rail.  Balance with eyes closed, balance on inclined surfaces.    PT Home Exercise Plan  [id number]     Consulted and Agree with Plan of Care  Patient       Patient will benefit from skilled therapeutic intervention in order to improve the following deficits and impairments:  Abnormal gait, Decreased balance, Decreased strength, Difficulty walking, Impaired sensation  Visit Diagnosis: Hemiplegia and hemiparesis following cerebral infarction affecting right dominant side (HCC)  Unsteadiness on feet  Other abnormalities of gait and mobility  Muscle weakness (generalized)  Repeated falls  Foot drop, left foot  Other disturbances of skin sensation     Problem List Patient Active Problem List   Diagnosis Date Noted  . Gait disturbance, post-stroke   . Slow transit constipation   . Small vessel disease ([place]) 10/19/2018  . Benign essential HTN   . Stage 2 chronic kidney disease   . Sequela of lacunar infarction   . CVA (cerebral vascular accident) ([place]) 10/17/2018  . Burning sensation of the foot 04/12/2015  . Leg weakness 01/08/2015  . Carpal tunnel syndrome 12/26/2014  . Dropfoot 12/26/2014  . Neuralgia neuritis, sciatic nerve 12/26/2014  . Diabetes mellitus ([place]) 08/24/2014  . BP (high blood pressure) 08/24/2014  . HLD (hyperlipidemia) 08/24/2014  . H/O coronary artery bypass surgery 08/24/2014    Rico Junker,  PT, DPT 02/21/19    12:44 PM    [place] [address] [place], [place], [address] Phone: [phone]   Fax:  [phone]  Name: CARMIN DIBARTOLO MRN: [id number] Date of Birth: [birth year]

## 2019-02-23 ENCOUNTER — Ambulatory Visit: Payer: Medicare Other | Admitting: Physical Therapy

## 2019-02-23 ENCOUNTER — Encounter: Payer: Self-pay | Admitting: Physical Therapy

## 2019-02-23 VITALS — BP 109/76 | HR 85

## 2019-02-23 DIAGNOSIS — R2681 Unsteadiness on feet: Secondary | ICD-10-CM

## 2019-02-23 DIAGNOSIS — I69351 Hemiplegia and hemiparesis following cerebral infarction affecting right dominant side: Secondary | ICD-10-CM

## 2019-02-23 DIAGNOSIS — R296 Repeated falls: Secondary | ICD-10-CM

## 2019-02-23 DIAGNOSIS — R208 Other disturbances of skin sensation: Secondary | ICD-10-CM

## 2019-02-23 DIAGNOSIS — M6281 Muscle weakness (generalized): Secondary | ICD-10-CM

## 2019-02-23 DIAGNOSIS — R2689 Other abnormalities of gait and mobility: Secondary | ICD-10-CM

## 2019-02-23 DIAGNOSIS — M21372 Foot drop, left foot: Secondary | ICD-10-CM

## 2019-02-23 NOTE — Therapy (Signed)
[place] [address] [place], [place], [address] Phone: [phone]   Fax:  [phone]  Physical Therapy Treatment  Patient Details  Name: Zachary Spence MRN: [id number] Date of Birth: [birth year] Referring Provider (PT): Charlett Blake, MD   Encounter Date: 02/23/2019  PT End of Session - 02/23/19 1225    Visit Number  13    Number of Visits  17    Date for PT Re-Evaluation  03/06/19    Authorization Type  Medicare and AARP -covered 100%, 10th visit PN    PT Start Time  0934    PT Stop Time  1015    PT Time Calculation (min)  41 min    Equipment Utilized During Treatment  Gait belt    Activity Tolerance  Patient tolerated treatment well    Behavior During Therapy  [facility] for tasks assessed/performed       Past Medical History:  Diagnosis Date  . Bowel obstruction ([place])   . Diabetes mellitus without complication ([place])   . Hypertension   . Non Hodgkin's lymphoma ([place])   . Stroke [facility])     Past Surgical History:  Procedure Laterality Date  . APPENDECTOMY    . CORONARY ARTERY BYPASS GRAFT    . SPLENECTOMY      Vitals:   02/23/19 0900  BP: 109/76  Pulse: 85    Subjective Assessment - 02/23/19 0941    Subjective  Denies falls or changes.  Went to get orthotic consult yesterday and they are working on [employer], clinical.    Pertinent History  Non-Hodgkin's lymphoma, HTN, DM with bilat peripheral neuropathy with L foot slap - wears a posterior leaf spring AFO x 4 years, bilat carpal tunnel, CABG and falls    Patient Stated Goals  To improve his balance and return to using the cane mostly, return to going to [employer] daily and working out at [employer] x 1 hour daily    Currently in Pain?  No/denies        [facility] Adult PT Treatment/Exercise - 02/23/19 0001      Transfers   Transfers  Sit to Stand;Stand to Sit    Sit to Stand  6: Modified independent (Device/Increase time)    Stand to Sit  6:  Modified independent (Device/Increase time)      Ambulation/Gait   Ambulation/Gait  Yes    Ambulation/Gait Assistance  5: Supervision    Ambulation Distance (Feet)  120 Feet   x 3 and through out gym during activities   Assistive device  Rolling walker    Gait Pattern  Step-through pattern;Decreased dorsiflexion - left;Trunk flexed    Ambulation Surface  Level;Indoor    Stairs  Yes    Stairs Assistance  5: Supervision    Stair Management Technique  One rail Left;Alternating pattern;Forwards    Number of Stairs  8    Height of Stairs  6      Exercises   Exercises  Shoulder;Knee/Hip      Knee/Hip Exercises: Aerobic   Tread Mill  Treadmill x 5 minutes at 1.2 mph with bil UE support and 3% incline.  Pt able to step on/off treadmill with modified I and adjust speed, incline, start/stop.    Other Aerobic  Scifit level 3.1 bil LE's x 5 minutes      Knee/Hip Exercises: Standing   Forward Step Up  Both;1 set;20 reps;Hand Hold: 1;Step Height: 6"    Forward Step Up Limitations  LOB x 2 but able to recover with rail    SLS  Taps to 6" step alternating LE's with 1 UE support x 20 reps      Shoulder Exercises: Standing   Flexion  Both;10 reps;Weights;Other (comment)   2 sets;6 # hand weights   ABduction  Both;10 reps;Weights;Other (comment)   6# hand weights;2 sets   Other Standing Exercises  Overhead flexion with 6# weight 10 reps x 2 sets      Ankle Exercises: Stretches   Gastroc Stretch  1 rep;60 seconds;Other (comment)   standing with bil heels off edge of step and bil UE support            PT Education - 02/23/19 1223    Education Details  Recommendation to perform UE exercises in seated position at gym and home due to decreased standing balance.    Person(s) Educated  Patient    Methods  Explanation    Comprehension  Verbalized understanding       PT Short Term Goals - 02/07/19 0941      PT SHORT TERM GOAL #1   Title  Pt will demonstrate independence with initial HEP     Baseline  met     Time  4    Period  Weeks    Status  Achieved    Target Date  02/04/19      PT SHORT TERM GOAL #2   Title  Pt will negotiate one step (x 2 reps) with cane and min A    Time  4    Period  Weeks    Status  Achieved    Target Date  02/04/19      PT SHORT TERM GOAL #3   Title  Pt will improve five time sit to stand from chair to </= 20 seconds with supervision from therapist    Baseline  23.19 but with min A from therapist to maintain balance; 16.5 on 2/12 but needs min assist to balance without UE assist    Time  4    Period  Weeks    Status  Not Met    Target Date  02/04/19      PT SHORT TERM GOAL #4   Title  Pt will improve BERG to >/= 29/56    Baseline  25/56; 29/56 on 02/02/19    Time  4    Period  Weeks    Status  Achieved    Target Date  02/04/19      PT SHORT TERM GOAL #5   Title  Pt will improve gait velocity with cane to >/= 2.62 ft/sec    Baseline  2.20 ft/sec with cane, 2.53 ft/sec with RW and 2.73 ft/sec on 02/07/19    Time  4    Period  Weeks    Status  Achieved    Target Date  02/04/19        PT Long Term Goals - 01/05/19 2122      PT LONG TERM GOAL #1   Title  Pt will be independent with final HEP and will report returning to [employer] and gym >/= 3 days a week    Time  8    Period  Weeks    Status  New    Target Date  03/06/19      PT LONG TERM GOAL #2   Title  Pt will ambulate 250' outside over paved surfaces and negotiate curb (x 2) with cane and supervision  Time  8    Period  Weeks    Target Date  03/06/19      PT LONG TERM GOAL #3   Title  Pt will improve five time sit to stand to </= 17 seconds with supervision    Time  8    Period  Weeks    Target Date  03/06/19      PT LONG TERM GOAL #4   Title  Pt will improve BERG to >/= 34/56    Time  8    Period  Weeks    Status  New    Target Date  03/06/19      PT LONG TERM GOAL #5   Title  Pt will improve gait velocity with cane to >/= 2.8 ft/sec    Time  8     Period  Weeks    Status  New    Target Date  03/06/19            Plan - 02/23/19 1225    Clinical Impression Statement  Skilled session focused on strength and endurance and gait along with preparation for pt to return to his gym routine.  Decreased weight today and discussed seated position for UE exercises due to decreased balance.  Continues with LOB posteriorly at times.  Awaiting insurance approval for orthotic per [employer].  Continue PT per POC.    Rehab Potential  Good    PT Frequency  2x / week    PT Duration  8 weeks    PT Treatment/Interventions  ADLs/Self Care Home Management;Aquatic Therapy;Electrical Stimulation;DME Instruction;Gait training;Stair training;Functional mobility training;Orthotic Fit/Training;Patient/family education;Neuromuscular re-education;Balance training;Therapeutic exercise;Therapeutic activities    PT Next Visit Plan  Continue to review gym routine - sticky note on my desk.  Stepping strategy especially backwards.  Gait training when he receives his new AFO.  Stair negotiation with cane but no rail.  Balance with eyes closed, balance on inclined surfaces.    PT Home Exercise Plan  [id number]     Consulted and Agree with Plan of Care  Patient       Patient will benefit from skilled therapeutic intervention in order to improve the following deficits and impairments:  Abnormal gait, Decreased balance, Decreased strength, Difficulty walking, Impaired sensation  Visit Diagnosis: Hemiplegia and hemiparesis following cerebral infarction affecting right dominant side (HCC)  Unsteadiness on feet  Other abnormalities of gait and mobility  Muscle weakness (generalized)  Repeated falls  Foot drop, left foot  Other disturbances of skin sensation     Problem List Patient Active Problem List   Diagnosis Date Noted  . Gait disturbance, post-stroke   . Slow transit constipation   . Small vessel disease ([place]) 10/19/2018  . Benign essential HTN   . Stage  2 chronic kidney disease   . Sequela of lacunar infarction   . CVA (cerebral vascular accident) ([place]) 10/17/2018  . Burning sensation of the foot 04/12/2015  . Leg weakness 01/08/2015  . Carpal tunnel syndrome 12/26/2014  . Dropfoot 12/26/2014  . Neuralgia neuritis, sciatic nerve 12/26/2014  . Diabetes mellitus ([place]) 08/24/2014  . BP (high blood pressure) 08/24/2014  . HLD (hyperlipidemia) 08/24/2014  . H/O coronary artery bypass surgery 08/24/2014    Narda Bonds, PTA [place] 02/23/19 12:28 PM Phone: [phone] Fax: [place] [address] [place], [place], [address] Phone: [phone]   Fax:  [phone]  Name: DEMITRIUS CRASS MRN: [id number]  Date of Birth: 11/07/1923

## 2019-02-28 ENCOUNTER — Ambulatory Visit: Payer: Medicare Other | Admitting: Rehabilitation

## 2019-02-28 ENCOUNTER — Encounter: Payer: Self-pay | Admitting: Rehabilitation

## 2019-02-28 DIAGNOSIS — M6281 Muscle weakness (generalized): Secondary | ICD-10-CM

## 2019-02-28 DIAGNOSIS — I69351 Hemiplegia and hemiparesis following cerebral infarction affecting right dominant side: Secondary | ICD-10-CM

## 2019-02-28 DIAGNOSIS — R2681 Unsteadiness on feet: Secondary | ICD-10-CM

## 2019-02-28 DIAGNOSIS — R2689 Other abnormalities of gait and mobility: Secondary | ICD-10-CM

## 2019-02-28 NOTE — Therapy (Signed)
[facility] [address] [place], [place], [address] Phone: [phone]   Fax:  [phone]  Physical Therapy Treatment  Patient Details  Name: Zachary Spence MRN: [id number] Date of Birth: 11/20/1923 Referring Provider (PT): Charlett Blake, MD   Encounter Date: 02/28/2019  PT End of Session - 02/28/19 1010    Visit Number  14    Number of Visits  17    Date for PT Re-Evaluation  03/06/19    Authorization Type  Medicare and [place] -covered 100%, 10th visit PN    PT Start Time  0920    PT Stop Time  1000    PT Time Calculation (min)  40 min    Equipment Utilized During Treatment  Gait belt    Activity Tolerance  Patient tolerated treatment well    Behavior During Therapy  [facility] for tasks assessed/performed       Past Medical History:  Diagnosis Date  . Bowel obstruction ([place])   . Diabetes mellitus without complication ([place])   . Hypertension   . Non Hodgkin's lymphoma ([place])   . Stroke [facility])     Past Surgical History:  Procedure Laterality Date  . APPENDECTOMY    . CORONARY ARTERY BYPASS GRAFT    . SPLENECTOMY      There were no vitals filed for this visit.  Subjective Assessment - 02/28/19 0919    Subjective  Pt reports no changes since last visit.     Pertinent History  Non-Hodgkin's lymphoma, HTN, DM with bilat peripheral neuropathy with L foot slap - wears a posterior leaf spring AFO x 4 years, bilat carpal tunnel, CABG and falls    Limitations  Walking    Patient Stated Goals  To improve his balance and return to using the cane mostly, return to going to [employer] daily and working out at [employer] x 1 hour daily    Currently in Pain?  No/denies                       [facility] Adult PT Treatment/Exercise - 02/28/19 1004      Ambulation/Gait   Ambulation/Gait  Yes    Ambulation/Gait Assistance  4: Min assist   for safety due to interval training   Ambulation/Gait Assistance Details   Continue to address treadmill training for safety at his gym and also for cardiovascular endurance.  Did this through interval training.  Began with 2 min warm up at 1.8 mph x 2 mins, increasing to 2.3 mph x 30 secs, 1.8 mph for 30 secs to 8 mins with last min being 1.8 mph.  Also did another bout of gait on treadmill at 3% incline x 1 min at 1.8 mph then doing intervals as before to 5 mins.  Pt tolerated well with cues for posture, increased stride length and improved hip/knee flex to compensate for foot slap.     Assistive device  --   treadmill   Gait Pattern  Step-through pattern;Decreased dorsiflexion - left;Trunk flexed    Ambulation Surface  Level;Indoor      Exercises   Exercises  Other Exercises    Other Exercises   Introduction to dead lifts during session first with use of trekking pole for correct posture when hinging hip forward x 10 reps, then without trekking pole moving hands down thigh just past knees x 10 reps, then lifting 10lb weight with both hands from rolling stool x 10 reps.  Note some balance deficits with posterior lean, therefore moved to having pts back at wall-attempted to perform with weight vertical on floor however this was too difficult, therefore maintained on rolling stool x 8 more reps.   Progressed through gym program with 6lbs weights for: overhead press x 10 each side, shoulder flex x 10 reps on each side, shoulder abd x 10 reps on each side then in standing had pt perform B bicep curl and tricep overhead press x 10 reps on each side.  Pt tolerated well min cues for correct technique.              PT Education - 02/28/19 1010    Education Details  importance of dead lifts in promoting extension muscles for improved posture, glute strength and hamstring strength.      Person(s) Educated  Patient    Methods  Explanation    Comprehension  Verbalized understanding       PT Short Term Goals - 02/07/19 0941      PT SHORT TERM GOAL #1   Title  Pt will  demonstrate independence with initial HEP    Baseline  met     Time  4    Period  Weeks    Status  Achieved    Target Date  02/04/19      PT SHORT TERM GOAL #2   Title  Pt will negotiate one step (x 2 reps) with cane and min A    Time  4    Period  Weeks    Status  Achieved    Target Date  02/04/19      PT SHORT TERM GOAL #3   Title  Pt will improve five time sit to stand from chair to </= 20 seconds with supervision from therapist    Baseline  23.19 but with min A from therapist to maintain balance; 16.5 on 2/12 but needs min assist to balance without UE assist    Time  4    Period  Weeks    Status  Not Met    Target Date  02/04/19      PT SHORT TERM GOAL #4   Title  Pt will improve BERG to >/= 29/56    Baseline  25/56; 29/56 on 02/02/19    Time  4    Period  Weeks    Status  Achieved    Target Date  02/04/19      PT SHORT TERM GOAL #5   Title  Pt will improve gait velocity with cane to >/= 2.62 ft/sec    Baseline  2.20 ft/sec with cane, 2.53 ft/sec with RW and 2.73 ft/sec on 02/07/19    Time  4    Period  Weeks    Status  Achieved    Target Date  02/04/19        PT Long Term Goals - 01/05/19 2122      PT LONG TERM GOAL #1   Title  Pt will be independent with final HEP and will report returning to [employer] and gym >/= 3 days a week    Time  8    Period  Weeks    Status  New    Target Date  03/06/19      PT LONG TERM GOAL #2   Title  Pt will ambulate 250' outside over paved surfaces and negotiate curb (x 2) with cane and supervision    Time  8    Period  Weeks    Target Date  03/06/19      PT LONG TERM GOAL #3   Title  Pt will improve five time sit to stand to </= 17 seconds with supervision    Time  8    Period  Weeks    Target Date  03/06/19      PT LONG TERM GOAL #4   Title  Pt will improve BERG to >/= 34/56    Time  8    Period  Weeks    Status  New    Target Date  03/06/19      PT LONG TERM GOAL #5   Title  Pt will improve gait velocity with  cane to >/= 2.8 ft/sec    Time  8    Period  Weeks    Status  New    Target Date  03/06/19            Plan - 02/28/19 1011    Clinical Impression Statement  Skilled session continues to focus on gym program, cardiovascular endurance, and introduction to dead lift exercise for improved posture, glute strength and hamstring strength with extension promotion.  Pt tolerated well, but does demonstrate tendency to have posterior LOB.     Rehab Potential  Good    PT Frequency  2x / week    PT Duration  8 weeks    PT Treatment/Interventions  ADLs/Self Care Home Management;Aquatic Therapy;Electrical Stimulation;DME Instruction;Gait training;Stair training;Functional mobility training;Orthotic Fit/Training;Patient/family education;Neuromuscular re-education;Balance training;Therapeutic exercise;Therapeutic activities    PT Next Visit Plan  Wasn't sure if we are D/Cing or not-he is scheduled past POC, if D/C then check goals-go over dead lift and add to HEP if able, Continue to review gym routine - sticky note on my desk.  Stepping strategy especially backwards.  Gait training when he receives his new AFO.  Stair negotiation with cane but no rail.  Balance with eyes closed, balance on inclined surfaces.    PT Home Exercise Plan  [id number]     Consulted and Agree with Plan of Care  Patient       Patient will benefit from skilled therapeutic intervention in order to improve the following deficits and impairments:  Abnormal gait, Decreased balance, Decreased strength, Difficulty walking, Impaired sensation  Visit Diagnosis: Hemiplegia and hemiparesis following cerebral infarction affecting right dominant side (HCC)  Other abnormalities of gait and mobility  Unsteadiness on feet  Muscle weakness (generalized)     Problem List Patient Active Problem List   Diagnosis Date Noted  . Gait disturbance, post-stroke   . Slow transit constipation   . Small vessel disease ([place]) 10/19/2018  .  Benign essential HTN   . Stage 2 chronic kidney disease   . Sequela of lacunar infarction   . CVA (cerebral vascular accident) ([place]) 10/17/2018  . Burning sensation of the foot 04/12/2015  . Leg weakness 01/08/2015  . Carpal tunnel syndrome 12/26/2014  . Dropfoot 12/26/2014  . Neuralgia neuritis, sciatic nerve 12/26/2014  . Diabetes mellitus ([place]) 08/24/2014  . BP (high blood pressure) 08/24/2014  . HLD (hyperlipidemia) 08/24/2014  . H/O coronary artery bypass surgery 08/24/2014    Cameron Sprang, PT, MPT [facility] [address] [place], [place], [address] Phone: [phone]   Fax:  [phone] 02/28/19, 10:15 AM  Name: Zachary Spence MRN: [id number] Date of Birth: [birth year]

## 2019-03-02 ENCOUNTER — Ambulatory Visit: Payer: Medicare Other | Admitting: Physical Therapy

## 2019-03-02 ENCOUNTER — Encounter: Payer: Self-pay | Admitting: Physical Therapy

## 2019-03-02 ENCOUNTER — Other Ambulatory Visit: Payer: Self-pay

## 2019-03-02 DIAGNOSIS — I69351 Hemiplegia and hemiparesis following cerebral infarction affecting right dominant side: Secondary | ICD-10-CM | POA: Diagnosis not present

## 2019-03-02 DIAGNOSIS — R2689 Other abnormalities of gait and mobility: Secondary | ICD-10-CM

## 2019-03-02 DIAGNOSIS — M6281 Muscle weakness (generalized): Secondary | ICD-10-CM

## 2019-03-02 DIAGNOSIS — R296 Repeated falls: Secondary | ICD-10-CM

## 2019-03-02 DIAGNOSIS — R208 Other disturbances of skin sensation: Secondary | ICD-10-CM

## 2019-03-02 DIAGNOSIS — R2681 Unsteadiness on feet: Secondary | ICD-10-CM

## 2019-03-02 DIAGNOSIS — M21372 Foot drop, left foot: Secondary | ICD-10-CM

## 2019-03-02 NOTE — Therapy (Addendum)
[facility] [address] [place], [place], [address] Phone: [phone]   Fax:  [phone]  Physical Therapy Treatment  Patient Details  Name: COURAGE BIGLOW MRN: [id number] Date of Birth: 1923-10-14 Referring Provider (PT): Charlett Blake, MD   Encounter Date: 03/02/2019  PT End of Session - 03/02/19 1118    Visit Number  15    Number of Visits  17    Date for PT Re-Evaluation  03/06/19    Authorization Type  Medicare and [employer] -covered 100%, 10th visit PN    PT Start Time  0932    PT Stop Time  1015    PT Time Calculation (min)  43 min    Activity Tolerance  Patient tolerated treatment well    Behavior During Therapy  [facility] for tasks assessed/performed       Past Medical History:  Diagnosis Date  . Bowel obstruction ([place])   . Diabetes mellitus without complication ([place])   . Hypertension   . Non Hodgkin's lymphoma ([place])   . Stroke [facility])     Past Surgical History:  Procedure Laterality Date  . APPENDECTOMY    . CORONARY ARTERY BYPASS GRAFT    . SPLENECTOMY      There were no vitals filed for this visit.  Subjective Assessment - 03/02/19 0937    Subjective  Reports no changes since last visit.  States he's been sore since Mondays session.  Thinks lifts from floor aggravated his back.  States he feels like his mobility is better then before his stroke.    Pertinent History  Non-Hodgkin's lymphoma, HTN, DM with bilat peripheral neuropathy with L foot slap - wears a posterior leaf spring AFO x 4 years, bilat carpal tunnel, CABG and falls    Limitations  Walking    Patient Stated Goals  To improve his balance and return to using the cane mostly, return to going to [employer] daily and working out at [employer] x 1 hour daily    Currently in Pain?  No/denies          [facility] Adult PT Treatment/Exercise - 03/02/19 0001      Transfers   Transfers  Sit to Stand;Stand to Sit    Sit to Stand  6: Modified  independent (Device/Increase time)    Five time sit to stand comments   20.75 sec with arms reaching out in front.  No lob or assistance needed    Stand to Sit  6: Modified independent (Device/Increase time)      Ambulation/Gait   Ambulation/Gait  Yes    Ambulation/Gait Assistance  5: Supervision    Ambulation Distance (Feet)  300 Feet   plus;100'x 1 and 120' x 3   Assistive device  Straight cane    Gait Pattern  Step-through pattern;Decreased dorsiflexion - left;Trunk flexed    Ambulation Surface  Level;Unlevel;Indoor;Outdoor;Paved    Gait velocity  2.62 ft/sec with cane    Curb  5: Supervision    Curb Details (indicate cue type and reason)  with cane x 2 reps      Standardized Balance Assessment   Standardized Balance Assessment  Berg Balance Test      Berg Balance Test   Sit to Stand  Able to stand without using hands and stabilize independently    Standing Unsupported  Able to stand safely 2 minutes    Sitting with Back Unsupported but Feet Supported on Floor or Stool  Able to sit  safely and securely 2 minutes    Stand to Sit  Sits safely with minimal use of hands    Transfers  Able to transfer safely, minor use of hands    Standing Unsupported with Eyes Closed  Able to stand 10 seconds with supervision    Standing Ubsupported with Feet Together  Able to place feet together independently and stand 1 minute safely    From Standing, Reach Forward with Outstretched Arm  Can reach forward >12 cm safely (5")    From Standing Position, Pick up Object from Floor  Able to pick up shoe, needs supervision    From Standing Position, Turn to Look Behind Over each Shoulder  Turn sideways only but maintains balance    Turn 360 Degrees  Needs close supervision or verbal cueing    Standing Unsupported, Alternately Place Feet on Step/Stool  Able to complete >2 steps/needs minimal assist    Standing Unsupported, One Foot in Front  Needs help to step but can hold 15 seconds    Standing on One Leg   Tries to lift leg/unable to hold 3 seconds but remains standing independently    Total Score  39      Elbow Exercises   Elbow Flexion  Strengthening;Both;10 reps;Bar weights/barbell   6# weight     Shoulder Exercises: Seated   Horizontal ABduction  Both;Strengthening;10 reps;Weights    Horizontal ABduction Weight (lbs)  6    Flexion  Strengthening;Both;10 reps;Weights    Flexion Weight (lbs)  6             PT Education - 03/02/19 1115    Education Details  Progress toward goals and extending POC x 6 weeks per Misty Stanley, PT.    Person(s) Educated  Patient    Methods  Explanation    Comprehension  Verbalized understanding       PT Short Term Goals - 02/07/19 0941      PT SHORT TERM GOAL #1   Title  Pt will demonstrate independence with initial HEP    Baseline  met     Time  4    Period  Weeks    Status  Achieved    Target Date  02/04/19      PT SHORT TERM GOAL #2   Title  Pt will negotiate one step (x 2 reps) with cane and min A    Time  4    Period  Weeks    Status  Achieved    Target Date  02/04/19      PT SHORT TERM GOAL #3   Title  Pt will improve five time sit to stand from chair to </= 20 seconds with supervision from therapist    Baseline  23.19 but with min A from therapist to maintain balance; 16.5 on 2/12 but needs min assist to balance without UE assist    Time  4    Period  Weeks    Status  Not Met    Target Date  02/04/19      PT SHORT TERM GOAL #4   Title  Pt will improve BERG to >/= 29/56    Baseline  25/56; 29/56 on 02/02/19    Time  4    Period  Weeks    Status  Achieved    Target Date  02/04/19      PT SHORT TERM GOAL #5   Title  Pt will improve gait velocity with cane to >/= 2.62 ft/sec  Baseline  2.20 ft/sec with cane, 2.53 ft/sec with RW and 2.73 ft/sec on 02/07/19    Time  4    Period  Weeks    Status  Achieved    Target Date  02/04/19        PT Long Term Goals - 03/02/19 1116      PT LONG TERM GOAL #1   Title  Pt will  be independent with final HEP and will report returning to [employer] and gym >/= 3 days a week    Time  8    Period  Weeks    Status  On-going      PT LONG TERM GOAL #2   Title  Pt will ambulate 250' outside over paved surfaces and negotiate curb (x 2) with cane and supervision    Time  8    Period  Weeks    Status  Achieved      PT LONG TERM GOAL #3   Title  Pt will improve five time sit to stand to </= 17 seconds with supervision    Baseline  20.75 seconds on 03/02/19    Time  8    Period  Weeks    Status  Not Met      PT LONG TERM GOAL #4   Title  Pt will improve BERG to >/= 34/56    Baseline  39/56 on 03/02/19    Time  8    Period  Weeks    Status  Achieved      PT LONG TERM GOAL #5   Title  Pt will improve gait velocity with cane to >/= 2.8 ft/sec    Baseline  2.62 ft/sec on 03/02/19    Time  8    Period  Weeks    Status  Not Met       New goals for recertification: PT Short Term Goals - 03/02/19 1709      PT SHORT TERM GOAL #1   Title  Pt will demonstrate full rotation through gym routine with supervision of therapist in controlled environment    Time  3    Period  Weeks    Status  New    Target Date  03/27/19      PT SHORT TERM GOAL #2   Title  Pt will receive new AFO and will initiate gait training with new AFO and cane/RW    Time  3    Period  Weeks    Status  New    Target Date  03/27/19      PT SHORT TERM GOAL #3   Title  Pt will improve five time sit to stand from chair to </= 18 seconds with supervision from therapist    Baseline  20    Time  3    Period  Weeks    Status  Revised    Target Date  03/27/19      PT SHORT TERM GOAL #4   Title  Pt will improve BERG to >/= 42/56    Baseline  39/56    Time  3    Period  Weeks    Status  Revised    Target Date  03/27/19      PT SHORT TERM GOAL #5   Title  Pt will improve gait velocity with cane to >/= 2.8 ft/sec    Baseline  2.6 ft/sec    Time  3    Period  Weeks  Status  Revised     Target Date  03/27/19      PT Long Term Goals - 03/02/19 1711      PT LONG TERM GOAL #1   Title  Pt will be independent with final HEP and will report returning to [employer] and gym >/= 3 days a week    Time  6    Period  Weeks    Status  Revised    Target Date  04/20/19      PT LONG TERM GOAL #2   Title  Pt will ambulate 500' outside over paved surfaces and negotiate curb (x 2) with cane and supervision with new AFO    Time  6    Period  Weeks    Status  New    Target Date  04/20/19      PT LONG TERM GOAL #3   Title  Pt will improve five time sit to stand to </= 16 seconds with supervision    Time  6    Period  Weeks    Status  Revised    Target Date  04/20/19      PT LONG TERM GOAL #4   Title  Pt will improve BERG to >/= 44/56    Time  6    Period  Weeks    Status  Revised    Target Date  04/20/19      PT LONG TERM GOAL #5   Title  Pt will improve gait velocity with cane and new AFO to >/= 3.0 ft/sec    Time  6    Period  Weeks    Status  Revised    Target Date  04/20/19           Plan - 03/02/19 1118    Clinical Impression Statement  Pt met LTG's 2 and 4.  LTG 3 and 5 unmet.  LTG 1 ongoing.  Pt continues with LOB posterior during session but recovered without assistance.  Pt awaiting AFO.  Renewal per Misty Stanley, PT.  (addendum per primary PT):  Pt awaiting AFO and will require ongoing therapy to ensure independence with donning, wear schedule and skin checks.  Will also require gait training with new AFO.  Pt also continues to require supervision to perform regular gym routine safely and is not ready to transition back to community wellness without supervision at this time.    Rehab Potential  Good    PT Frequency  2x / week    PT Duration  6 weeks    PT Treatment/Interventions  ADLs/Self Care Home Management;Aquatic Therapy;Electrical Stimulation;DME Instruction;Gait training;Stair training;Functional mobility training;Orthotic Fit/Training;Patient/family  education;Neuromuscular re-education;Balance training;Therapeutic exercise;Therapeutic activities    PT Next Visit Plan  Renewal per Misty Stanley, PT. -  need to check with [employer] about new AFO.  Gait training with new AFO once received.  Continue to review gym routine and ensure ability to perform safely (sticky on my desk)    [place] and Agree with Plan of Care  Patient       Patient will benefit from skilled therapeutic intervention in order to improve the following deficits and impairments:  Abnormal gait, Decreased balance, Decreased strength, Difficulty walking, Impaired sensation  Visit Diagnosis: Hemiplegia and hemiparesis following cerebral infarction affecting right dominant side (HCC)  Other abnormalities of gait and mobility  Unsteadiness on feet  Muscle weakness (generalized)     Problem List Patient  Active Problem List   Diagnosis Date Noted  . Gait disturbance, post-stroke   . Slow transit constipation   . Small vessel disease ([place]) 10/19/2018  . Benign essential HTN   . Stage 2 chronic kidney disease   . Sequela of lacunar infarction   . CVA (cerebral vascular accident) ([place]) 10/17/2018  . Burning sensation of the foot 04/12/2015  . Leg weakness 01/08/2015  . Carpal tunnel syndrome 12/26/2014  . Dropfoot 12/26/2014  . Neuralgia neuritis, sciatic nerve 12/26/2014  . Diabetes mellitus ([place]) 08/24/2014  . BP (high blood pressure) 08/24/2014  . HLD (hyperlipidemia) 08/24/2014  . H/O coronary artery bypass surgery 08/24/2014    Narda Bonds, [place] 03/02/19 11:21 AM Phone: [phone] Fax: [phone]  Rico Junker, PT, DPT 03/02/19    5:18 PM     [facility] [address] [place], [place], [address] Phone: [phone]   Fax:  [phone]  Name: MILFERD ANSELL MRN: [id number] Date of Birth:  Apr 02, 1923

## 2019-03-02 NOTE — Addendum Note (Signed)
Addended by: Rico Junker on: 03/02/2019 05:18 PM   Modules accepted: Orders

## 2019-03-09 ENCOUNTER — Encounter: Payer: Self-pay | Admitting: Physical Therapy

## 2019-03-09 ENCOUNTER — Ambulatory Visit: Payer: Medicare Other | Admitting: Physical Therapy

## 2019-03-09 DIAGNOSIS — M21372 Foot drop, left foot: Secondary | ICD-10-CM

## 2019-03-09 DIAGNOSIS — R208 Other disturbances of skin sensation: Secondary | ICD-10-CM

## 2019-03-09 DIAGNOSIS — I69351 Hemiplegia and hemiparesis following cerebral infarction affecting right dominant side: Secondary | ICD-10-CM

## 2019-03-09 DIAGNOSIS — R2689 Other abnormalities of gait and mobility: Secondary | ICD-10-CM

## 2019-03-09 DIAGNOSIS — R2681 Unsteadiness on feet: Secondary | ICD-10-CM

## 2019-03-09 DIAGNOSIS — M6281 Muscle weakness (generalized): Secondary | ICD-10-CM

## 2019-03-09 DIAGNOSIS — R296 Repeated falls: Secondary | ICD-10-CM

## 2019-03-09 NOTE — Therapy (Signed)
[facility] [address] [place], [place], [address] Phone: [phone]   Fax:  [phone]  Physical Therapy Treatment  Patient Details  Name: Zachary Spence MRN: [id number] Date of Birth: [birth year] Referring Provider (PT): Charlett Blake, MD   Encounter Date: 03/09/2019  PT End of Session - 03/09/19 1306    Visit Number  16    Number of Visits  27    Date for PT Re-Evaluation  04/20/19    Authorization Type  Medicare and AARP -covered 100%, 10th visit PN    PT Start Time  1103    PT Stop Time  1147    PT Time Calculation (min)  44 min    Activity Tolerance  Patient tolerated treatment well    Behavior During Therapy  [facility] for tasks assessed/performed       Past Medical History:  Diagnosis Date  . Bowel obstruction ([place])   . Diabetes mellitus without complication ([place])   . Hypertension   . Non Hodgkin's lymphoma ([place])   . Stroke [facility])     Past Surgical History:  Procedure Laterality Date  . APPENDECTOMY    . CORONARY ARTERY BYPASS GRAFT    . SPLENECTOMY      There were no vitals filed for this visit.  Subjective Assessment - 03/09/19 1107    Subjective  Has not heard from [place] about AFO; therapist to contact.  Ambulating with the cane more now without any issues.  Library has closed.  No basketball games to go to.  Back is no longer bothering him.    Pertinent History  Non-Hodgkin's lymphoma, HTN, DM with bilat peripheral neuropathy with L foot slap - wears a posterior leaf spring AFO x 4 years, bilat carpal tunnel, CABG and falls    Limitations  Walking    Patient Stated Goals  To improve his balance and return to using the cane mostly, return to going to [employer] daily and working out at [employer] x 1 hour daily    Currently in Pain?  No/denies                       [facility] Adult PT Treatment/Exercise - 03/09/19 1301      Exercises   Exercises  Elbow    Other Exercises    Simulated other seated weight machines pt uses at gym with use of theraband for seated back extensions, 2 sets x 10 reps.  Tall kneeling resisted trunk rotations to L and R with green theraband, 2 sets x 10 reps.      Elbow Exercises   Elbow Flexion  Strengthening;Both;10 reps;Seated    Elbow Flexion Limitations  simulating weight machine at gym; 2 sets x 10 reps    Elbow Extension  Strengthening;Both;10 reps;Seated;Theraband    Theraband Level (Elbow Extension)  Level 3 (Green)    Elbow Extension Limitations  simulating weight machine at gym; 2 sets x 10 reps      Knee/Hip Exercises: Seated   Abduction/Adduction   Strengthening;Both;10 reps    Abd/Adduction Limitations  simulating weight machine at gym; 2 sets x 10 reps          Balance Exercises - 03/09/19 1138      Balance Exercises: Standing   Sit to Stand Time  Performed 3 sets sit > stand x 10 reps; first set with feet apart, 2 sets with narrow BOS.  Had cane in both UE in front of  pt in midline.  Focused on maintaining upright posture during full forward lean and weight shift over BOS during sit > stand to prevent posterior LOB and focus on control when transitioning from stand > sit.        PT Education - 03/09/19 1305    Education Details  recommendation for seated gym exercises    Person(s) Educated  Patient    Methods  Explanation;Demonstration    Comprehension  Verbalized understanding;Returned demonstration       PT Short Term Goals - 03/02/19 1709      PT SHORT TERM GOAL #1   Title  Pt will demonstrate full rotation through gym routine with supervision of therapist in controlled environment    Time  3    Period  Weeks    Status  New    Target Date  03/27/19      PT SHORT TERM GOAL #2   Title  Pt will receive new AFO and will initiate gait training with new AFO and cane/RW    Time  3    Period  Weeks    Status  New    Target Date  03/27/19      PT SHORT TERM GOAL #3   Title  Pt will improve five time  sit to stand from chair to </= 18 seconds with supervision from therapist    Baseline  20    Time  3    Period  Weeks    Status  Revised    Target Date  03/27/19      PT SHORT TERM GOAL #4   Title  Pt will improve BERG to >/= 42/56    Baseline  39/56    Time  3    Period  Weeks    Status  Revised    Target Date  03/27/19      PT SHORT TERM GOAL #5   Title  Pt will improve gait velocity with cane to >/= 2.8 ft/sec    Baseline  2.6 ft/sec    Time  3    Period  Weeks    Status  Revised    Target Date  03/27/19        PT Long Term Goals - 03/02/19 1711      PT LONG TERM GOAL #1   Title  Pt will be independent with final HEP and will report returning to [employer] and gym >/= 3 days a week    Time  6    Period  Weeks    Status  Revised    Target Date  04/20/19      PT LONG TERM GOAL #2   Title  Pt will ambulate 500' outside over paved surfaces and negotiate curb (x 2) with cane and supervision with new AFO    Time  6    Period  Weeks    Status  New    Target Date  04/20/19      PT LONG TERM GOAL #3   Title  Pt will improve five time sit to stand to </= 16 seconds with supervision    Time  6    Period  Weeks    Status  Revised    Target Date  04/20/19      PT LONG TERM GOAL #4   Title  Pt will improve BERG to >/= 44/56    Time  6    Period  Weeks    Status  Revised  Target Date  04/20/19      PT LONG TERM GOAL #5   Title  Pt will improve gait velocity with cane and new AFO to >/= 3.0 ft/sec    Time  6    Period  Weeks    Status  Revised    Target Date  04/20/19            Plan - 03/09/19 1306    Clinical Impression Statement  Continued to review and simulate seated gym exercises.  Pt able to perform safely; recommending that pt can perform with son's supervision as long as gym remains open.  Continued to focus on sequencing of weight shift and balance during sit <> stand to prevent posterior LOB.  Will continue to address and will contact [employer]  about AFO approval.  Will continue to progress towards LTG.    Rehab Potential  Good    PT Frequency  2x / week    PT Duration  6 weeks    PT Treatment/Interventions  ADLs/Self Care Home Management;Aquatic Therapy;Electrical Stimulation;DME Instruction;Gait training;Stair training;Functional mobility training;Orthotic Fit/Training;Patient/family education;Neuromuscular re-education;Balance training;Therapeutic exercise;Therapeutic activities    PT Next Visit Plan  need to check with [employer] about new AFO.  Gait training with new AFO once received.  Continue to review gym routine -standing with free weights and treadmill - pt has returned to doing seated exercises with son's supervision - and ensure ability to perform safely (sticky on my desk)    [place] and Agree with Plan of Care  Patient       Patient will benefit from skilled therapeutic intervention in order to improve the following deficits and impairments:  Abnormal gait, Decreased balance, Decreased strength, Difficulty walking, Impaired sensation  Visit Diagnosis: Hemiplegia and hemiparesis following cerebral infarction affecting right dominant side (HCC)  Other abnormalities of gait and mobility  Muscle weakness (generalized)  Unsteadiness on feet  Repeated falls  Foot drop, left foot  Other disturbances of skin sensation     Problem List Patient Active Problem List   Diagnosis Date Noted  . Gait disturbance, post-stroke   . Slow transit constipation   . Small vessel disease ([place]) 10/19/2018  . Benign essential HTN   . Stage 2 chronic kidney disease   . Sequela of lacunar infarction   . CVA (cerebral vascular accident) ([place]) 10/17/2018  . Burning sensation of the foot 04/12/2015  . Leg weakness 01/08/2015  . Carpal tunnel syndrome 12/26/2014  . Dropfoot 12/26/2014  . Neuralgia neuritis, sciatic nerve 12/26/2014  . Diabetes mellitus ([place]) 08/24/2014  . BP (high blood  pressure) 08/24/2014  . HLD (hyperlipidemia) 08/24/2014  . H/O coronary artery bypass surgery 08/24/2014    Rico Junker, PT, DPT 03/09/19    1:09 PM    [facility] [address] [place], [place], [address] Phone: [phone]   Fax:  [phone]  Name: Zachary Spence MRN: [id number] Date of Birth: 08-26-1923

## 2019-03-09 NOTE — Patient Instructions (Signed)
Hello Zachary Spence  Today at therapy we practiced all of his seated gym exercises.   He would be safe to go to the gym and perform all of the SEATED weight machines with you there to supervise.  We will continue to hold on doing the treadmill or standing with hand weights until he receives his new brace.  Please contact me if you have any questions.  Thanks, Rico Junker, PT, DPT 03/09/19    11:36 AM   [phone]

## 2019-03-10 DIAGNOSIS — Z8673 Personal history of transient ischemic attack (TIA), and cerebral infarction without residual deficits: Secondary | ICD-10-CM | POA: Insufficient documentation

## 2019-03-11 ENCOUNTER — Ambulatory Visit: Payer: Medicare Other | Admitting: Physical Therapy

## 2019-03-14 ENCOUNTER — Telehealth: Payer: Self-pay | Admitting: Physical Therapy

## 2019-03-14 ENCOUNTER — Other Ambulatory Visit: Payer: Self-pay

## 2019-03-14 ENCOUNTER — Telehealth: Payer: Self-pay

## 2019-03-14 ENCOUNTER — Telehealth (INDEPENDENT_AMBULATORY_CARE_PROVIDER_SITE_OTHER): Payer: Medicare Other | Admitting: Neurology

## 2019-03-14 DIAGNOSIS — I6990 Unspecified sequelae of unspecified cerebrovascular disease: Secondary | ICD-10-CM | POA: Diagnosis not present

## 2019-03-14 NOTE — Progress Notes (Signed)
  Virtual Visit via Video Note  I connected with Zachary Spence on 03/14/19 at  2:00 PM EDT by a video enabled telemedicine application and verified that I am speaking with the correct person using two identifiers.   I discussed the limitations of evaluation and management by telemedicine and the availability of in person appointments. The patient expressed understanding and agreed to proceed.  History of Present Illness: Zachary Spence was seen today for a virtual telephonic visit.  He has history of remote lacunar stroke in October 2019 from which she has recovered well.  He states he has had no recurrent stroke or TIA symptoms.  He continues to have minimum right-sided weakness.  He has mild numbness in his hands from carpal tunnel.  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  Is working with physical therapist to get new foot brace.  He recently saw his primary care physician 03/10/2019 and had lab work done and lipid profile and hemoglobin A1c was satisfactory.  Hemoglobin A1c was 6.8.  Total cholesterol 172, triglyceride 205.  He is tolerating aspirin well without side effects.  He is also on glipizide for diabetes and Crestor 5 mg for his lipids.  He has no neurological complaints.    Observations/Objective: Patient seems to be doing well overall without any new neurological concerns.  Assessment and Plan: 85 year old male with left subcortical lacunar infarct in October 2019 due to small vessel disease.  Bilateral hand paresthesias from carpal tunnel and left foot drop from old neuropathy all of which appear to be stable.   Follow Up Instructions: Continue aspirin 81 mg daily for stroke prevention and strict control of hypertension with blood pressure goal below 130/90, lipids with LDL cholesterol goal below 70 mg percent and diabetes with hemoglobin A1c goal below 6.5%.  Continue follow-up with primary care physician towards achieving these targets.  Patient was advised to  walk slowly and avoid sudden movements for fall risk prevention.  He was advised to make a follow-up appointment in stroke clinic in 6 months.  He voiced understanding.    I discussed the assessment and treatment plan with the patient. The patient was provided an opportunity to ask questions and all were answered. The patient agreed with the plan and demonstrated an understanding of the instructions.   The patient was advised to call back or seek an in-person evaluation if the symptoms worsen or if the condition fails to improve as anticipated.  I provided 20 minutes of non-face-to-face time during this encounter.   Antony Contras, MD

## 2019-03-14 NOTE — Telephone Encounter (Signed)
[name] was contacted today regarding the temporary closing of [facility] due to Covid-19.  Therapist discussed:  Contacting [employer] about scheduling time to go in and sign forms to receive new AFO, continuation of HEP and target re-open date.  Patient IS NOT interested in further information for an e-visit, virtual check in, or telehealth visit, if those services become available.    [facility] will follow up with patients when we are able to resume care.  Rico Junker, PT, DPT 03/14/19    3:03 PM    [facility] [address] [place], [place]  [address] Phone:  [phone] Fax:  [phone] \

## 2019-03-14 NOTE — Telephone Encounter (Signed)
I spoke with pt that we are reducing office visits due to the [place] 19 pandemic.We are offering telephone visits at this time and your insurance will be filed  Pt gave consent to the telephone call and is not having any issues.

## 2019-03-16 ENCOUNTER — Ambulatory Visit: Payer: Medicare Other | Admitting: Physical Therapy

## 2019-03-18 ENCOUNTER — Ambulatory Visit: Payer: Medicare Other | Admitting: Physical Therapy

## 2019-03-21 ENCOUNTER — Ambulatory Visit: Payer: Medicare Other | Admitting: Rehabilitation

## 2019-03-23 ENCOUNTER — Ambulatory Visit: Payer: Medicare Other | Admitting: Physical Therapy

## 2019-03-28 ENCOUNTER — Ambulatory Visit: Payer: Medicare Other | Admitting: Physical Therapy

## 2019-03-30 ENCOUNTER — Ambulatory Visit: Payer: Medicare Other | Admitting: Physical Therapy

## 2019-04-04 ENCOUNTER — Ambulatory Visit: Payer: Medicare Other | Admitting: Physical Therapy

## 2019-04-06 ENCOUNTER — Ambulatory Visit: Payer: Medicare Other | Admitting: Physical Therapy

## 2019-04-11 ENCOUNTER — Ambulatory Visit: Payer: Medicare Other | Admitting: Rehabilitation

## 2019-04-13 ENCOUNTER — Ambulatory Visit: Payer: Medicare Other | Admitting: Physical Therapy

## 2019-04-20 ENCOUNTER — Telehealth: Payer: Self-pay | Admitting: Cardiovascular Disease

## 2019-04-20 NOTE — Telephone Encounter (Signed)
Informed pt that appt with Dr. Claiborne Billings has been rescheduled to 08/17/19 at 2:40 PM. Pt verbalized thanks for the call.

## 2019-04-21 ENCOUNTER — Ambulatory Visit: Payer: Medicare Other | Admitting: Cardiovascular Disease

## 2019-05-18 ENCOUNTER — Other Ambulatory Visit: Payer: Self-pay

## 2019-05-18 ENCOUNTER — Ambulatory Visit (INDEPENDENT_AMBULATORY_CARE_PROVIDER_SITE_OTHER): Payer: Medicare Other | Admitting: Podiatry

## 2019-05-18 ENCOUNTER — Encounter: Payer: Self-pay | Admitting: Podiatry

## 2019-05-18 VITALS — BP 116/65 | HR 76 | Temp 97.5°F

## 2019-05-18 DIAGNOSIS — L603 Nail dystrophy: Secondary | ICD-10-CM | POA: Diagnosis not present

## 2019-05-18 DIAGNOSIS — M79676 Pain in unspecified toe(s): Secondary | ICD-10-CM | POA: Diagnosis not present

## 2019-05-18 DIAGNOSIS — B351 Tinea unguium: Secondary | ICD-10-CM

## 2019-05-18 MED ORDER — GENTAMICIN SULFATE 0.1 % EX CREA: 1.0000 "application " | TOPICAL_CREAM | Freq: Two times a day (BID) | CUTANEOUS | 1 refills | Status: DC

## 2019-05-22 NOTE — Progress Notes (Signed)
   SUBJECTIVE 85 year old male presenting today as a new patient with a chief complaint of problems with his bilateral great toenails that began about three weeks ago. He states he feel and the right toenail came off. He states the left great toenail came off a few days ago. He reports associated bleeding that he noticed last night. He has not done anything for treatment.  He also complains of elongated, thickened nails that cause pain while ambulating in shoes. He is unable to trim his own nails. Patient is here for further evaluation and treatment.  Past Medical History:  Diagnosis Date  . Bowel obstruction ([place])   . Diabetes mellitus without complication ([place])   . Hypertension   . Non Hodgkin's lymphoma ([place])   . Stroke [facility])     OBJECTIVE General Patient is awake, alert, and oriented x 3 and in no acute distress.  Derm Partially detached left great toenail noted. Traumatic loss of the right great toenail with routine healing noted. Skin is dry and supple bilateral. Negative open lesions or macerations. Remaining integument unremarkable. Nails are tender, long, thickened and dystrophic with subungual debris, consistent with onychomycosis, 2-5 bilateral. No signs of infection noted.  Vasc  DP and PT pedal pulses palpable bilaterally. Temperature gradient within normal limits.   Neuro Epicritic and protective threshold sensation grossly intact bilaterally.   Musculoskeletal Exam No symptomatic pedal deformities noted bilateral. Muscular strength within normal limits.  ASSESSMENT 1. Onychodystrophic nails 2-5 bilateral with hyperkeratosis of nails.  2. Onychomycosis of nail due to dermatophyte bilateral 3. Dystrophic nail/partially detached left hallux 4. Traumatic loss of right great toenail with routine healing  5. Peripheral neuropathy LLE  PLAN OF CARE 1. Patient evaluated today.  2. Instructed to maintain good pedal hygiene and foot care.  3. Mechanical debridement of nails  2-5 bilaterally performed using a nail nipper. Filed with dremel without incident.  4. Discussed treatment alternatives and plan of care. Explained nail avulsion procedure and post procedure course to patient. 5. Patient opted for total temporary nail avulsion of the left hallux.  6. No lidocaine utilized since patient is neuropathic.   7. Light dressing applied. 8. Prescription for Gentamicin cream provided to patient to use daily with a bandage.  9. Return to clinic in 3 mos for routine care.    Edrick Kins, DPM [facility]  Dr. Edrick Kins, [place]                                        [place], [place] [address]                Office [phone]  Fax [phone]

## 2019-06-02 ENCOUNTER — Ambulatory Visit: Payer: Medicare Other | Admitting: Physical Therapy

## 2019-06-02 DIAGNOSIS — R2689 Other abnormalities of gait and mobility: Secondary | ICD-10-CM | POA: Insufficient documentation

## 2019-06-02 DIAGNOSIS — M6281 Muscle weakness (generalized): Secondary | ICD-10-CM | POA: Insufficient documentation

## 2019-06-02 DIAGNOSIS — M21372 Foot drop, left foot: Secondary | ICD-10-CM | POA: Insufficient documentation

## 2019-06-02 DIAGNOSIS — I69351 Hemiplegia and hemiparesis following cerebral infarction affecting right dominant side: Secondary | ICD-10-CM | POA: Insufficient documentation

## 2019-06-02 DIAGNOSIS — R296 Repeated falls: Secondary | ICD-10-CM | POA: Insufficient documentation

## 2019-06-02 DIAGNOSIS — R208 Other disturbances of skin sensation: Secondary | ICD-10-CM | POA: Insufficient documentation

## 2019-06-02 DIAGNOSIS — R2681 Unsteadiness on feet: Secondary | ICD-10-CM | POA: Insufficient documentation

## 2019-06-02 NOTE — Therapy (Signed)
[facility] [address] [place], [place], [address] Phone: [phone]   Fax:  [phone]  Physical Therapy Treatment  Patient Details  Name: Zachary Spence MRN: [id number] Date of Birth: 12/05/1923 Referring Provider (PT): Charlett Blake, MD   Encounter Date: 06/02/2019   Attempted to connect with patient via [employer] today.  Pt unable to utilize technology for virtual PT appointment.  Will reschedule appointment and will contact pt's son to assist with signing into next appointment.  Rico Junker, PT, DPT 06/02/19    10:22 AM    Past Medical History:  Diagnosis Date  . Bowel obstruction ([place])   . Diabetes mellitus without complication ([place])   . Hypertension   . Non Hodgkin's lymphoma ([place])   . Stroke [facility])     Past Surgical History:  Procedure Laterality Date  . APPENDECTOMY    . CORONARY ARTERY BYPASS GRAFT    . SPLENECTOMY      There were no vitals filed for this visit.                              PT Short Term Goals - 03/02/19 1709      PT SHORT TERM GOAL #1   Title  Pt will demonstrate full rotation through gym routine with supervision of therapist in controlled environment    Time  3    Period  Weeks    Status  New    Target Date  03/27/19      PT SHORT TERM GOAL #2   Title  Pt will receive new AFO and will initiate gait training with new AFO and cane/RW    Time  3    Period  Weeks    Status  New    Target Date  03/27/19      PT SHORT TERM GOAL #3   Title  Pt will improve five time sit to stand from chair to </= 18 seconds with supervision from therapist    Baseline  20    Time  3    Period  Weeks    Status  Revised    Target Date  03/27/19      PT SHORT TERM GOAL #4   Title  Pt will improve BERG to >/= 42/56    Baseline  39/56    Time  3    Period  Weeks    Status  Revised    Target Date  03/27/19      PT SHORT TERM GOAL #5   Title  Pt will improve gait velocity with cane to >/= 2.8 ft/sec    Baseline  2.6 ft/sec    Time  3    Period  Weeks    Status  Revised    Target Date  03/27/19        PT Long Term Goals - 03/02/19 1711      PT LONG TERM GOAL #1   Title  Pt will be independent with final HEP and will report returning to [employer] and gym >/= 3 days a week    Time  6    Period  Weeks    Status  Revised    Target Date  04/20/19      PT LONG TERM GOAL #2   Title  Pt will ambulate 500' outside over paved surfaces and negotiate curb (x 2) with cane and supervision with  new AFO    Time  6    Period  Weeks    Status  New    Target Date  04/20/19      PT LONG TERM GOAL #3   Title  Pt will improve five time sit to stand to </= 16 seconds with supervision    Time  6    Period  Weeks    Status  Revised    Target Date  04/20/19      PT LONG TERM GOAL #4   Title  Pt will improve BERG to >/= 44/56    Time  6    Period  Weeks    Status  Revised    Target Date  04/20/19      PT LONG TERM GOAL #5   Title  Pt will improve gait velocity with cane and new AFO to >/= 3.0 ft/sec    Time  6    Period  Weeks    Status  Revised    Target Date  04/20/19              Patient will benefit from skilled therapeutic intervention in order to improve the following deficits and impairments:     Visit Diagnosis: Hemiplegia and hemiparesis following cerebral infarction affecting right dominant side [facility])     Problem List Patient Active Problem List   Diagnosis Date Noted  . Gait disturbance, post-stroke   . Slow transit constipation   . Small vessel disease ([place]) 10/19/2018  . Benign essential HTN   . Stage 2 chronic kidney disease   . Sequela of lacunar infarction   . CVA (cerebral vascular accident) ([place]) 10/17/2018  . Burning sensation of the foot 04/12/2015  . Leg weakness 01/08/2015  . Carpal tunnel syndrome 12/26/2014  . Dropfoot 12/26/2014  . Neuralgia neuritis, sciatic nerve  12/26/2014  . Diabetes mellitus ([place]) 08/24/2014  . BP (high blood pressure) 08/24/2014  . HLD (hyperlipidemia) 08/24/2014  . H/O coronary artery bypass surgery 08/24/2014    Rico Junker, PT, DPT 06/02/19    10:21 AM    [facility] [address] [place], [place], [address] Phone: [phone]   Fax:  [phone]  Name: Zachary Spence MRN: [id number] Date of Birth: 10/09/1923

## 2019-06-16 ENCOUNTER — Ambulatory Visit: Payer: Medicare Other | Admitting: Physical Therapy

## 2019-07-01 ENCOUNTER — Other Ambulatory Visit: Payer: Self-pay

## 2019-07-01 ENCOUNTER — Ambulatory Visit: Payer: Medicare Other | Attending: Physical Medicine & Rehabilitation | Admitting: Physical Therapy

## 2019-07-01 DIAGNOSIS — R2681 Unsteadiness on feet: Secondary | ICD-10-CM | POA: Diagnosis present

## 2019-07-01 DIAGNOSIS — M6281 Muscle weakness (generalized): Secondary | ICD-10-CM | POA: Diagnosis present

## 2019-07-01 DIAGNOSIS — R2689 Other abnormalities of gait and mobility: Secondary | ICD-10-CM | POA: Diagnosis present

## 2019-07-01 DIAGNOSIS — M21372 Foot drop, left foot: Secondary | ICD-10-CM

## 2019-07-01 DIAGNOSIS — R296 Repeated falls: Secondary | ICD-10-CM

## 2019-07-01 DIAGNOSIS — I69351 Hemiplegia and hemiparesis following cerebral infarction affecting right dominant side: Secondary | ICD-10-CM | POA: Diagnosis not present

## 2019-07-01 DIAGNOSIS — R208 Other disturbances of skin sensation: Secondary | ICD-10-CM

## 2019-07-01 NOTE — Therapy (Signed)
[name] [address] [place], [place], [address] Phone: [phone]   Fax:  [phone]  Physical Therapy Treatment  Patient Details  Name: Zachary Spence MRN: [id number] Date of Birth: [birth year] Referring Provider (PT): Charlett Blake, MD   Encounter Date: 07/01/2019   Physical Therapy Telehealth Visit:  I connected with Zachary Spence today at 11:05 am by [facility] video conference and verified that I am speaking with the correct person using two identifiers.  I discussed the limitations, risks, security and privacy concerns of performing an evaluation and management service by Webex and the availability of in person appointments.  I also discussed with the patient that there may be a patient responsible charge related to this service. The patient expressed understanding and agreed to proceed.    The patient's address was confirmed.  Identified to the patient that therapist is a licensed Physical Therapist in the state of [place].  Verified phone # as [phone] to call in case of technical difficulties.   PT End of Session - 07/01/19 1227    Visit Number  17    Number of Visits  23    Date for PT Re-Evaluation  08/30/19    Authorization Type  Medicare and AARP -covered 100%, 10th visit PN    PT Start Time  1105    PT Stop Time  1145    PT Time Calculation (min)  40 min    Activity Tolerance  Patient tolerated treatment well    Behavior During Therapy  WFL for tasks assessed/performed       Past Medical History:  Diagnosis Date  . Bowel obstruction ([place])   . Diabetes mellitus without complication ([place])   . Hypertension   . Non Hodgkin's lymphoma ([place])   . Stroke [facility])     Past Surgical History:  Procedure Laterality Date  . APPENDECTOMY    . CORONARY ARTERY BYPASS GRAFT    . SPLENECTOMY      There were no vitals filed for this visit.  Subjective Assessment - 07/01/19 1205    Subjective  Pt has  new AFO from [employer] and it has been working very well.  Has been staying busy reading, working on crossword puzzles and walking 15 minutes a day out side with son, using his cane.  Has also been working on supine and standing strengthening exercises.    Pertinent History  Non-Hodgkin's lymphoma, HTN, DM with bilat peripheral neuropathy with L foot slap - wears a posterior leaf spring AFO x 4 years, bilat carpal tunnel, CABG and falls    Limitations  Walking    Patient Stated Goals  To improve his balance and return to using the cane mostly, return to going to [employer] daily and working out at [employer] x 1 hour daily         [facility] PT Assessment - 07/01/19 1208      Assessment   Medical Diagnosis  CVA with RLE weakness, gait disorder    Referring Provider (PT)  Charlett Blake, MD    Onset Date/Surgical Date  10/17/18    Hand Dominance  Right    Prior Therapy  CIR, HH      Precautions   Precautions  Fall    Precaution Comments  Non-Hodgkin's lymphoma, HTN, DM with bilat peripheral neuropathy with L foot slap - wears a posterior leaf spring AFO x 4 years, bilat carpal tunnel, CABG and falls    Required Braces or  Orthoses  Other Brace/Splint    Other Brace/Splint  AFO LLE      Prior Function   Level of Independence  Independent with household mobility with device;Independent with community mobility with device;Independent with gait;Independent with transfers;Requires assistive device for independence      Observation/Other Assessments   Focus on Therapeutic Outcomes (FOTO)   Not performed      Standardized Balance Assessment   Standardized Balance Assessment  Berg Balance Test;Five Times Sit to Stand;10 meter walk test    Five times sit to stand comments   20.02 with use of UE from dining room arm chair with supervision.  When performed without use of UE, continues to require min A due to intermittent posterior LOB    10 Meter Walk  did 60f distance: 7.16 seconds or 1.4 ft/sec  without AD but with son supervision      Berg Balance Test   Sit to Stand  Able to stand  independently using hands    Standing Unsupported  Able to stand safely 2 minutes    Sitting with Back Unsupported but Feet Supported on Floor or Stool  Able to sit safely and securely 2 minutes    Stand to Sit  Controls descent by using hands    Transfers  Able to transfer safely, definite need of hands    Standing Unsupported with Eyes Closed  Needs help to keep from falling    Standing Unsupported with Feet Together  Able to place feet together independently and stand for 1 minute with supervision    From Standing, Reach Forward with Outstretched Arm  Can reach forward >12 cm safely (5")    From Standing Position, Pick up Object from Floor  Able to pick up shoe, needs supervision    From Standing Position, Turn to Look Behind Over each Shoulder  Turn sideways only but maintains balance    Turn 360 Degrees  Needs close supervision or verbal cueing    Standing Unsupported, Alternately Place Feet on Step/Stool  Able to complete >2 steps/needs minimal assist    Standing Unsupported, One Foot in Front  Able to take small step independently and hold 30 seconds    Standing on One Leg  Tries to lift leg/unable to hold 3 seconds but remains standing independently    Total Score  33    Berg comment:  33/56                           PT Education - 07/01/19 1218    Education Details  areas of continued impairment, focus of ongoing PT, plan for 4-6 more telehealth visits    Person(s) Educated  Patient;Child(ren)    Methods  Explanation    Comprehension  Verbalized understanding       PT Short Term Goals - 07/01/19 1231      PT SHORT TERM GOAL #1   Title  Pt will demonstrate full rotation through gym routine with supervision of therapist in controlled environment    Time  3    Period  Weeks    Status  Not Met    Target Date  03/27/19      PT SHORT TERM GOAL #2   Title  Pt will  receive new AFO and will initiate gait training with new AFO and cane/RW    Time  3    Period  Weeks    Status  Achieved    Target Date  03/27/19      PT SHORT TERM GOAL #3   Title  Pt will improve five time sit to stand from chair to </= 18 seconds with supervision from therapist    Baseline  20    Time  3    Period  Weeks    Status  Not Met    Target Date  03/27/19      PT SHORT TERM GOAL #4   Title  Pt will improve BERG to >/= 42/56    Baseline  39/56    Time  3    Period  Weeks    Status  Not Met    Target Date  03/27/19      PT SHORT TERM GOAL #5   Title  Pt will improve gait velocity with cane to >/= 2.8 ft/sec    Baseline  2.6 ft/sec    Time  3    Period  Weeks    Status  Not Met    Target Date  03/27/19        PT Long Term Goals - 07/01/19 1232      PT LONG TERM GOAL #1   Title  Pt will be independent with final HEP and will report returning to [employer] and gym >/= 3 days a week    Baseline  Due to COVID    Time  6    Period  Weeks    Status  Not Met      PT LONG TERM GOAL #2   Title  Pt will ambulate 500' outside over paved surfaces and negotiate curb (x 2) with cane and supervision with new AFO    Time  6    Period  Weeks    Status  Achieved      PT LONG TERM GOAL #3   Title  Pt will improve five time sit to stand to </= 16 seconds with supervision    Time  6    Period  Weeks    Status  Not Met      PT LONG TERM GOAL #4   Title  Pt will improve BERG to >/= 44/56    Time  6    Period  Weeks    Status  Not Met      PT LONG TERM GOAL #5   Title  Pt will improve gait velocity with cane and new AFO to >/= 3.0 ft/sec    Time  6    Period  Weeks    Status  Not Met      New goals for recertification: PT Short Term Goals - 07/01/19 1231      PT SHORT TERM GOAL #1   Title  Pt will demonstrate ability to safely perform HEP with supervision of son    Time  4    Period  Weeks    Status  Revised    Target Date  07/31/19      PT SHORT TERM  GOAL #2   Title  --    Time  --    Period  --    Status  --    Target Date  --      PT SHORT TERM GOAL #3   Title  Pt will improve five time sit to stand from chair to </= 18 seconds with use of UE and supervision from son    Baseline  20 seconds with use of UE    Time  4  Period  Weeks    Status  Revised    Target Date  07/31/19      PT SHORT TERM GOAL #4   Title  Pt will improve BERG to >/= 39/56    Baseline  33/56    Time  4    Period  Weeks    Status  Revised    Target Date  07/31/19      PT SHORT TERM GOAL #5   Title  Pt will improve gait velocity with cane to >/= 2.0 ft/sec and new AFO    Baseline  1.4    Time  4    Period  Weeks    Status  Revised    Target Date  07/31/19      PT Long Term Goals - 07/01/19 1232      PT LONG TERM GOAL #1   Title  Pt will be independent with final HEP    Baseline  Due to COVID    Time  8    Period  Weeks    Status  Revised    Target Date  08/30/19      PT LONG TERM GOAL #2   Title  --    Time  --    Period  --    Status  --      PT LONG TERM GOAL #3   Title  Pt will improve five time sit to stand to </= 16 seconds with use of UE and supervision    Time  8    Period  Weeks    Status  Revised    Target Date  08/30/19      PT LONG TERM GOAL #4   Title  Pt will improve BERG to >/= 42/56    Time  8    Period  Weeks    Status  Revised    Target Date  08/30/19      PT LONG TERM GOAL #5   Title  Pt will improve gait velocity with cane and new AFO to >/= 2.5 ft/sec    Time  8    Period  Weeks    Status  Revised    Target Date  08/30/19            Plan - 07/01/19 1231    Clinical Impression Statement  Connected with patient today via Telehealth to perform re-assessment of balance and gait and determine if pt would benefit from continued PT services.  Pt has received his new AFO and has been ambulating outside with his cane and son's supervision without any difficulty.  Pt has been compliant with HEP since  being in quarantine.  Pt's outcome measures have remained relatively stable since last assessment 3-4 months ago.  Pt continues to demonstrate impaired LE strength and sensation, impaired balance and balance reactions, and slowed gait velocity indicating that pt is still at increased risk for falls.  Pt has not been able to return to community activities due to ongoing COVID concerns.  Pt will benefit from ongoing skilled PT services via Telehealth to continue to address these impairments, to maximize functional mobility independence and decrease falls risk at home and when pt returns to community activities.    Rehab Potential  Good    PT Frequency  1x / week    PT Duration  8 weeks    PT Treatment/Interventions  ADLs/Self Care Home Management;Aquatic Therapy;Electrical Stimulation;DME Instruction;Gait training;Stair training;Functional mobility training;Orthotic Fit/Training;Patient/family education;Neuromuscular re-education;Balance training;Therapeutic exercise;Therapeutic  activities    PT Next Visit Plan  review HEP and update to continue to include narrow BOS, eyes closed, turns at counter top/corner balance    PT Home Exercise Plan  [email or web address]     Consulted and Agree with Plan of Care  Patient;Family member/caregiver    Family Member Consulted  son       Patient will benefit from skilled therapeutic intervention in order to improve the following deficits and impairments:  Abnormal gait, Decreased balance, Decreased strength, Difficulty walking, Impaired sensation  Visit Diagnosis: 1. Hemiplegia and hemiparesis following cerebral infarction affecting right dominant side (HCC)   2. Other abnormalities of gait and mobility   3. Muscle weakness (generalized)   4. Unsteadiness on feet   5. Repeated falls   6. Foot drop, left foot   7. Other disturbances of skin sensation        Problem List Patient Active Problem List   Diagnosis Date Noted  . Gait disturbance, post-stroke   . Slow  transit constipation   . Small vessel disease ([place]) 10/19/2018  . Benign essential HTN   . Stage 2 chronic kidney disease   . Sequela of lacunar infarction   . CVA (cerebral vascular accident) ([place]) 10/17/2018  . Burning sensation of the foot 04/12/2015  . Leg weakness 01/08/2015  . Carpal tunnel syndrome 12/26/2014  . Dropfoot 12/26/2014  . Neuralgia neuritis, sciatic nerve 12/26/2014  . Diabetes mellitus ([place]) 08/24/2014  . BP (high blood pressure) 08/24/2014  . HLD (hyperlipidemia) 08/24/2014  . H/O coronary artery bypass surgery 08/24/2014    Rico Junker, PT, DPT 07/01/19    12:46 PM    [facility] [address] [place], [place], [address] Phone: [phone]   Fax:  [phone]  Name: Zachary Spence MRN: [id number] Date of Birth: 03/16/1923

## 2019-07-08 ENCOUNTER — Other Ambulatory Visit: Payer: Self-pay

## 2019-07-08 ENCOUNTER — Encounter: Payer: Self-pay | Admitting: Physical Therapy

## 2019-07-08 ENCOUNTER — Ambulatory Visit: Payer: Medicare Other | Admitting: Physical Therapy

## 2019-07-08 DIAGNOSIS — M21372 Foot drop, left foot: Secondary | ICD-10-CM

## 2019-07-08 DIAGNOSIS — R2689 Other abnormalities of gait and mobility: Secondary | ICD-10-CM

## 2019-07-08 DIAGNOSIS — R208 Other disturbances of skin sensation: Secondary | ICD-10-CM

## 2019-07-08 DIAGNOSIS — M6281 Muscle weakness (generalized): Secondary | ICD-10-CM

## 2019-07-08 DIAGNOSIS — R296 Repeated falls: Secondary | ICD-10-CM

## 2019-07-08 DIAGNOSIS — I69351 Hemiplegia and hemiparesis following cerebral infarction affecting right dominant side: Secondary | ICD-10-CM

## 2019-07-08 DIAGNOSIS — R2681 Unsteadiness on feet: Secondary | ICD-10-CM

## 2019-07-08 NOTE — Patient Instructions (Signed)
Access Code: [email or web address]  URL: [email or web address]  Date: 07/08/2019  Prepared by: Misty Stanley   Exercises Standing Anterior Posterior Weight Shift - 10 reps - 2 sets - 1x daily - 7x weekly Wide Stance with Eyes Closed - 2 sets - 10 second hold - 1x daily - 7x weekly Romberg Stance with Head Nods - 10 reps - 2 sets - 1x daily - 7x weekly Romberg Stance with Head Rotation - 10 reps - 2 sets - 1x daily - 7x weekly Squat - 10 reps - 1 sets - 1x daily - 7x weekly Sit to Stand with Arm Reach Toward Target - 10 reps - 1 sets - 1x daily - 7x weekly Standing Shoulder Horizontal Abduction with Resistance - 10 reps - 1 sets - 1x daily - 7x weekly

## 2019-07-08 NOTE — Therapy (Signed)
[facility] [address] [place], [place], [address] Phone: [phone]   Fax:  [phone]  Physical Therapy Treatment  Patient Details  Name: Zachary Spence MRN: [id number] Date of Birth: 1923-12-13 Referring Provider (PT): Charlett Blake, MD   Encounter Date: 07/08/2019   I connected with  Michaelene Song on 07/08/19 by a video enabled telehealth application and verified that I am speaking with the correct person using two identifiers.   I discussed the limitations of evaluation and management by telehealth. The patient expressed understanding and agreed to proceed.   PT End of Session - 07/08/19 1230    Visit Number  18    Number of Visits  23    Date for PT Re-Evaluation  08/30/19    Authorization Type  Medicare and AARP -covered 100%, 10th visit PN    PT Start Time  1000    PT Stop Time  1042    PT Time Calculation (min)  42 min    Activity Tolerance  Patient tolerated treatment well    Behavior During Therapy  WFL for tasks assessed/performed       Past Medical History:  Diagnosis Date  . Bowel obstruction ([place])   . Diabetes mellitus without complication ([place])   . Hypertension   . Non Hodgkin's lymphoma ([place])   . Stroke [facility])     Past Surgical History:  Procedure Laterality Date  . APPENDECTOMY    . CORONARY ARTERY BYPASS GRAFT    . SPLENECTOMY      There were no vitals filed for this visit.  Subjective Assessment - 07/08/19 1003    Subjective  No issues to report, nothing new since last visit.    Pertinent History  Non-Hodgkin's lymphoma, HTN, DM with bilat peripheral neuropathy with L foot slap - wears a posterior leaf spring AFO x 4 years, bilat carpal tunnel, CABG and falls    Limitations  Walking    Patient Stated Goals  To improve his balance and return to using the cane mostly, return to going to [employer] daily and working out at [employer] x 1 hour daily    Currently in Pain?   No/denies       Reviewed and updated the following exercises for HEP with UE support and son providing supervision:  Access Code: [email or web address]  URL: [email or web address]  Date: 07/08/2019  Prepared by: Misty Stanley   Exercises Standing Anterior Posterior Weight Shift - 10 reps - 2 sets - 1x daily - 7x weekly Wide Stance with Eyes Closed - 2 sets - 10 second hold - 1x daily - 7x weekly Romberg Stance with Head Nods - 10 reps - 2 sets - 1x daily - 7x weekly Romberg Stance with Head Rotation - 10 reps - 2 sets - 1x daily - 7x weekly Squat - 10 reps - 1 sets - 1x daily - 7x weekly Sit to Stand with Arm Reach Toward Target - 10 reps - 1 sets - 1x daily - 7x weekly Standing Shoulder Horizontal Abduction with Resistance - 10 reps - 1 sets - 1x daily - 7x weekly    PT Education - 07/08/19 1228    Education Details  updated HEP, mailed to son    Person(s) Educated  Patient    Methods  Explanation;Demonstration    Comprehension  Verbalized understanding;Returned demonstration       PT Short Term Goals - 07/01/19 1231      PT SHORT TERM  GOAL #1   Title  Pt will demonstrate ability to safely perform HEP with supervision of son    Time  4    Period  Weeks    Status  Revised    Target Date  07/31/19      PT SHORT TERM GOAL #2   Title  --    Time  --    Period  --    Status  --    Target Date  --      PT SHORT TERM GOAL #3   Title  Pt will improve five time sit to stand from chair to </= 18 seconds with use of UE and supervision from son    Baseline  20 seconds with use of UE    Time  4    Period  Weeks    Status  Revised    Target Date  07/31/19      PT SHORT TERM GOAL #4   Title  Pt will improve BERG to >/= 39/56    Baseline  33/56    Time  4    Period  Weeks    Status  Revised    Target Date  07/31/19      PT SHORT TERM GOAL #5   Title  Pt will improve gait velocity with cane to >/= 2.0 ft/sec and new AFO    Baseline  1.4    Time  4    Period  Weeks     Status  Revised    Target Date  07/31/19        PT Long Term Goals - 07/01/19 1232      PT LONG TERM GOAL #1   Title  Pt will be independent with final HEP    Baseline  Due to COVID    Time  8    Period  Weeks    Status  Revised    Target Date  08/30/19      PT LONG TERM GOAL #2   Title  --    Time  --    Period  --    Status  --      PT LONG TERM GOAL #3   Title  Pt will improve five time sit to stand to </= 16 seconds with use of UE and supervision    Time  8    Period  Weeks    Status  Revised    Target Date  08/30/19      PT LONG TERM GOAL #4   Title  Pt will improve BERG to >/= 42/56    Time  8    Period  Weeks    Status  Revised    Target Date  08/30/19      PT LONG TERM GOAL #5   Title  Pt will improve gait velocity with cane and new AFO to >/= 2.5 ft/sec    Time  8    Period  Weeks    Status  Revised    Target Date  08/30/19            Plan - 07/08/19 1231    Clinical Impression Statement  Treatment session focused on review of current HEP within the patient's home with son's supervision.  Updated standing balance exercises, initiated standing posture exercises and provided recommendations for safety to minimize risk for falls.  Pt return demonstrated all exercises with son present.  Exercises emailed to son.  Will continue to progress towards  LTG.    Rehab Potential  Good    PT Frequency  1x / week    PT Duration  8 weeks    PT Treatment/Interventions  ADLs/Self Care Home Management;Aquatic Therapy;Electrical Stimulation;DME Instruction;Gait training;Stair training;Functional mobility training;Orthotic Fit/Training;Patient/family education;Neuromuscular re-education;Balance training;Therapeutic exercise;Therapeutic activities    PT Next Visit Plan  continue to add to HEP: turns at counter top, side stepping, PNF with band for posture    PT Home Exercise Plan  [email or web address]     Consulted and Agree with Plan of Care  Patient;Family member/caregiver     Family Member Consulted  son       Patient will benefit from skilled therapeutic intervention in order to improve the following deficits and impairments:  Abnormal gait, Decreased balance, Decreased strength, Difficulty walking, Impaired sensation  Visit Diagnosis: 1. Hemiplegia and hemiparesis following cerebral infarction affecting right dominant side (HCC)   2. Other abnormalities of gait and mobility   3. Muscle weakness (generalized)   4. Unsteadiness on feet   5. Repeated falls   6. Foot drop, left foot   7. Other disturbances of skin sensation        Problem List Patient Active Problem List   Diagnosis Date Noted  . Gait disturbance, post-stroke   . Slow transit constipation   . Small vessel disease ([place]) 10/19/2018  . Benign essential HTN   . Stage 2 chronic kidney disease   . Sequela of lacunar infarction   . CVA (cerebral vascular accident) ([place]) 10/17/2018  . Burning sensation of the foot 04/12/2015  . Leg weakness 01/08/2015  . Carpal tunnel syndrome 12/26/2014  . Dropfoot 12/26/2014  . Neuralgia neuritis, sciatic nerve 12/26/2014  . Diabetes mellitus ([place]) 08/24/2014  . BP (high blood pressure) 08/24/2014  . HLD (hyperlipidemia) 08/24/2014  . H/O coronary artery bypass surgery 08/24/2014    Rico Junker, PT, DPT 07/08/19    12:42 PM    [facility] [address] [place], [place], [address] Phone: [phone]   Fax:  [phone]  Name: [facility] MRN: [id number] Date of Birth: 01/06/1923

## 2019-07-22 ENCOUNTER — Ambulatory Visit: Payer: Medicare Other | Admitting: Physical Therapy

## 2019-07-22 ENCOUNTER — Other Ambulatory Visit: Payer: Self-pay

## 2019-07-22 ENCOUNTER — Encounter: Payer: Self-pay | Admitting: Physical Therapy

## 2019-07-22 DIAGNOSIS — R2681 Unsteadiness on feet: Secondary | ICD-10-CM

## 2019-07-22 DIAGNOSIS — I69351 Hemiplegia and hemiparesis following cerebral infarction affecting right dominant side: Secondary | ICD-10-CM

## 2019-07-22 DIAGNOSIS — R296 Repeated falls: Secondary | ICD-10-CM

## 2019-07-22 DIAGNOSIS — R208 Other disturbances of skin sensation: Secondary | ICD-10-CM

## 2019-07-22 DIAGNOSIS — M6281 Muscle weakness (generalized): Secondary | ICD-10-CM

## 2019-07-22 DIAGNOSIS — R2689 Other abnormalities of gait and mobility: Secondary | ICD-10-CM

## 2019-07-22 DIAGNOSIS — M21372 Foot drop, left foot: Secondary | ICD-10-CM

## 2019-07-22 NOTE — Patient Instructions (Signed)
Access Code: [email or web address]  URL: [email or web address]  Date: 07/22/2019  Prepared by: Misty Stanley   Exercises Standing Anterior Posterior Weight Shift - 10 reps - 2 sets - 2x daily - 7x weekly Wide Stance with Eyes Closed - 2 sets - 10 second hold - 2x daily - 7x weekly Romberg Stance with Head Nods - 10 reps - 2 sets - 2x daily - 7x weekly Romberg Stance with Head Rotation - 10 reps - 2 sets - 2x daily - 7x weekly Sit to Stand with Arm Reach Toward Target - 10 reps - 1 sets - 2x daily - 7x weekly Standing Shoulder Horizontal Abduction with Resistance - 10 reps - 1 sets - 2x daily - 7x weekly Standing Shoulder Single Arm PNF D2 Flexion with Resistance - 10 reps - 2 sets - 2x daily - 7x weekly Squat - 10 reps - 3 sets - 1x daily - 7x weekly Shoulder extension with resistance - Neutral - 10 reps - 3 sets - 1x daily - 7x weekly Standing Quarter Turn with Counter Support - 10 reps - 3 sets - 1x daily - 7x weekly Side Stepping with Counter Support - 10 reps - 3 sets - 1x daily - 7x weekly

## 2019-07-22 NOTE — Therapy (Signed)
[facility] [address] [place], [place], [address] Phone: [phone]   Fax:  [phone]  Physical Therapy Telehealth Treatment  Patient Details  Name: Zachary Spence MRN: [id number] Date of Birth: 08/01/1923 Referring Provider (PT): Charlett Blake, MD   Encounter Date: 07/22/2019   I connected with  Michaelene Song on 07/22/19 by a video enabled telehealth application and verified that I am speaking with the correct person using two identifiers.   I discussed the limitations of evaluation and management by telehealth. The patient expressed understanding and agreed to proceed.    PT End of Session - 07/22/19 1058    Visit Number  19    Number of Visits  23    Date for PT Re-Evaluation  08/30/19    Authorization Type  Medicare and [employer] -covered 100%, 10th visit PN    PT Start Time  1000    PT Stop Time  1040    PT Time Calculation (min)  40 min    Activity Tolerance  Patient tolerated treatment well    Behavior During Therapy  WFL for tasks assessed/performed       Past Medical History:  Diagnosis Date  . Bowel obstruction ([place])   . Diabetes mellitus without complication ([place])   . Hypertension   . Non Hodgkin's lymphoma ([place])   . Stroke [facility])     Past Surgical History:  Procedure Laterality Date  . APPENDECTOMY    . CORONARY ARTERY BYPASS GRAFT    . SPLENECTOMY      There were no vitals filed for this visit.  Subjective Assessment - 07/22/19 1215    Subjective  Has been working on his HEP 2x/day plus other exericses he performs in supine in his bed.  No falls.    Pertinent History  Non-Hodgkin's lymphoma, HTN, DM with bilat peripheral neuropathy with L foot slap - wears a posterior leaf spring AFO x 4 years, bilat carpal tunnel, CABG and falls    Limitations  Walking    Patient Stated Goals  To improve his balance and return to using the cane mostly, return to going to [employer] daily and  working out at [employer] x 1 hour daily    Currently in Pain?  No/denies         Access Code: [email or web address]  URL: [email or web address]  Date: 07/22/2019  Prepared by: Misty Stanley   Exercises Standing Anterior Posterior Weight Shift - 10 reps - 2 sets - 2x daily - 7x weekly Wide Stance with Eyes Closed - 2 sets - 10 second hold - 2x daily - 7x weekly Romberg Stance with Head Nods - 10 reps - 2 sets - 2x daily - 7x weekly Romberg Stance with Head Rotation - 10 reps - 2 sets - 2x daily - 7x weekly Sit to Stand with Arm Reach Toward Target - 10 reps - 1 sets - 2x daily - 7x weekly Standing Shoulder Horizontal Abduction with Resistance - 10 reps - 1 sets - 2x daily - 7x weekly Standing Shoulder Single Arm PNF D2 Flexion with Resistance - 10 reps - 2 sets - 2x daily - 7x weekly Squat - 10 reps - 3 sets - 1x daily - 7x weekly Shoulder extension with resistance - Neutral - 10 reps - 3 sets - 1x daily - 7x weekly Standing Quarter Turn with Counter Support - 10 reps - 3 sets - 1x daily - 7x weekly Side Stepping with Counter Support -  10 reps - 3 sets - 1x daily - 7x weekly               [facility] Adult PT Treatment/Exercise - 07/22/19 1233      Shoulder Exercises: Standing   Horizontal ABduction  Strengthening;Both;10 reps;Theraband    Theraband Level (Shoulder Horizontal ABduction)  Level 1 (Yellow)    Horizontal ABduction Limitations  standing with back to wall     Diagonals  Strengthening;Right;Left;10 reps;Theraband    Theraband Level (Shoulder Diagonals)  Level 1 (Yellow)    Diagonals Limitations  standing with back to wall for support          Balance Exercises - 07/22/19 1230      Balance Exercises: Standing   Standing Eyes Opened  Narrow base of support (BOS);Head turns;Foam/compliant surface;Other reps (comment)   10 reps head nods/turns   Standing Eyes Closed  Wide (BOA);Foam/compliant surface;10 secs    Wall Bumps  Hip    Wall Bumps-Hips  Eyes  opened;Anterior/posterior;10 reps   with bilat UE resisted extension (theraband)   Stepping Strategy  Anterior;Posterior;10 reps    Sidestepping  Upper extremity support;4 reps    Turning  Right;Left;10 reps        PT Education - 07/22/19 1220    Education Details  balance reaction training, updated HEP    Person(s) Educated  Patient;Child(ren)    Methods  Explanation;Demonstration;Handout    Comprehension  Verbalized understanding;Returned demonstration       PT Short Term Goals - 07/01/19 1231      PT SHORT TERM GOAL #1   Title  Pt will demonstrate ability to safely perform HEP with supervision of son    Time  4    Period  Weeks    Status  Revised    Target Date  07/31/19      PT SHORT TERM GOAL #2   Title  --    Time  --    Period  --    Status  --    Target Date  --      PT SHORT TERM GOAL #3   Title  Pt will improve five time sit to stand from chair to </= 18 seconds with use of UE and supervision from son    Baseline  20 seconds with use of UE    Time  4    Period  Weeks    Status  Revised    Target Date  07/31/19      PT SHORT TERM GOAL #4   Title  Pt will improve BERG to >/= 39/56    Baseline  33/56    Time  4    Period  Weeks    Status  Revised    Target Date  07/31/19      PT SHORT TERM GOAL #5   Title  Pt will improve gait velocity with cane to >/= 2.0 ft/sec and new AFO    Baseline  1.4    Time  4    Period  Weeks    Status  Revised    Target Date  07/31/19        PT Long Term Goals - 07/01/19 1232      PT LONG TERM GOAL #1   Title  Pt will be independent with final HEP    Baseline  Due to COVID    Time  8    Period  Weeks    Status  Revised    Target  Date  08/30/19      PT LONG TERM GOAL #2   Title  --    Time  --    Period  --    Status  --      PT LONG TERM GOAL #3   Title  Pt will improve five time sit to stand to </= 16 seconds with use of UE and supervision    Time  8    Period  Weeks    Status  Revised    Target Date   08/30/19      PT LONG TERM GOAL #4   Title  Pt will improve BERG to >/= 42/56    Time  8    Period  Weeks    Status  Revised    Target Date  08/30/19      PT LONG TERM GOAL #5   Title  Pt will improve gait velocity with cane and new AFO to >/= 2.5 ft/sec    Time  8    Period  Weeks    Status  Revised    Target Date  08/30/19            Plan - 07/22/19 1221    Clinical Impression Statement  Continued to focus on HEP with review of static balance exercises, addition of UE resistance and postural exercises and dynamic balance exercises at the counter to address weight shifting and balance while pivoting/turning.  Also incorporated forward and backward stepping strategy training with son present to provide supervision and guarding.  Pt demonstrated improved self-correction of LOB during static standing balance but continued to demonstrate posterior LOB with narrow BOS with head movements.  Will continue to address balance deficits and LE strength deficits to continue to progress towards LTG and decrease falls risk.    Rehab Potential  Good    PT Frequency  1x / week    PT Duration  8 weeks    PT Treatment/Interventions  ADLs/Self Care Home Management;Aquatic Therapy;Electrical Stimulation;DME Instruction;Gait training;Stair training;Functional mobility training;Orthotic Fit/Training;Patient/family education;Neuromuscular re-education;Balance training;Therapeutic exercise;Therapeutic activities    PT Next Visit Plan  STG on next visit (8/21); 10th visit PN; focus on LE strengthening with resistance bands.  Keep working on turning at [employer]; balance reactions at the wall EO/EC    PT Home Exercise Plan  [email or web address]     Consulted and Agree with Plan of Care  Patient;Family member/caregiver    Family Member Consulted  son       Patient will benefit from skilled therapeutic intervention in order to improve the following deficits and impairments:  Abnormal gait, Decreased balance,  Decreased strength, Difficulty walking, Impaired sensation  Visit Diagnosis: 1. Hemiplegia and hemiparesis following cerebral infarction affecting right dominant side (HCC)   2. Other abnormalities of gait and mobility   3. Muscle weakness (generalized)   4. Unsteadiness on feet   5. Repeated falls   6. Foot drop, left foot   7. Other disturbances of skin sensation        Problem List Patient Active Problem List   Diagnosis Date Noted  . Gait disturbance, post-stroke   . Slow transit constipation   . Small vessel disease ([place]) 10/19/2018  . Benign essential HTN   . Stage 2 chronic kidney disease   . Sequela of lacunar infarction   . CVA (cerebral vascular accident) ([place]) 10/17/2018  . Burning sensation of the foot 04/12/2015  . Leg weakness 01/08/2015  . Carpal tunnel syndrome 12/26/2014  .  Dropfoot 12/26/2014  . Neuralgia neuritis, sciatic nerve 12/26/2014  . Diabetes mellitus ([place]) 08/24/2014  . BP (high blood pressure) 08/24/2014  . HLD (hyperlipidemia) 08/24/2014  . H/O coronary artery bypass surgery 08/24/2014    Rico Junker, PT, DPT 07/22/19    12:37 PM    [facility] [address] [place], [place], [address] Phone: [phone]   Fax:  [phone]  Name: YAROSLAV GOMBOS MRN: [id number] Date of Birth: [birth year]

## 2019-07-29 ENCOUNTER — Ambulatory Visit: Payer: Medicare Other | Attending: Physical Medicine & Rehabilitation | Admitting: Physical Therapy

## 2019-07-29 ENCOUNTER — Other Ambulatory Visit: Payer: Self-pay

## 2019-07-29 DIAGNOSIS — M6281 Muscle weakness (generalized): Secondary | ICD-10-CM | POA: Diagnosis present

## 2019-07-29 DIAGNOSIS — I69351 Hemiplegia and hemiparesis following cerebral infarction affecting right dominant side: Secondary | ICD-10-CM | POA: Diagnosis not present

## 2019-07-29 DIAGNOSIS — R208 Other disturbances of skin sensation: Secondary | ICD-10-CM | POA: Insufficient documentation

## 2019-07-29 DIAGNOSIS — R2689 Other abnormalities of gait and mobility: Secondary | ICD-10-CM | POA: Insufficient documentation

## 2019-07-29 DIAGNOSIS — R2681 Unsteadiness on feet: Secondary | ICD-10-CM

## 2019-07-29 DIAGNOSIS — M21372 Foot drop, left foot: Secondary | ICD-10-CM | POA: Diagnosis present

## 2019-07-29 DIAGNOSIS — R296 Repeated falls: Secondary | ICD-10-CM | POA: Diagnosis present

## 2019-07-29 NOTE — Therapy (Addendum)
[facility] [address] [place], [place], [address] Phone: [phone]   Fax:  [phone]  Physical Therapy Treatment and 10th visit Progress Note  Patient Details  Name: Zachary Spence MRN: [id number] Date of Birth: 1923-07-06 Referring Provider (PT): Charlett Blake, MD   Encounter Date: 07/29/2019   I connected with  Zachary Spence on 07/29/19 by a video enabled telehealth application and verified that I am speaking with the correct person using two identifiers.   I discussed the limitations of evaluation and management by telehealth. The patient expressed understanding and agreed to proceed.     Progress Note Reporting Period 02/14/2019 to 07/29/2019  See note below for Objective Data and Assessment of Progress/Goals.    PT End of Session - 07/29/19 1715    Visit Number  20    Number of Visits  23    Date for PT Re-Evaluation  08/30/19    Authorization Type  Medicare and AARP -covered 100%, 10th visit PN    PT Start Time  1315    PT Stop Time  1400    PT Time Calculation (min)  45 min    Activity Tolerance  Patient tolerated treatment well    Behavior During Therapy  WFL for tasks assessed/performed       Past Medical History:  Diagnosis Date  . Bowel obstruction ([place])   . Diabetes mellitus without complication ([place])   . Hypertension   . Non Hodgkin's lymphoma ([place])   . Stroke [facility])     Past Surgical History:  Procedure Laterality Date  . APPENDECTOMY    . CORONARY ARTERY BYPASS GRAFT    . SPLENECTOMY      There were no vitals filed for this visit.  Subjective Assessment - 07/29/19 1710    Subjective  Doing well, still doing HEP 2x/day.  No falls.    Pertinent History  Non-Hodgkin's lymphoma, HTN, DM with bilat peripheral neuropathy with L foot slap - wears a posterior leaf spring AFO x 4 years, bilat carpal tunnel, CABG and falls    Limitations  Walking    Patient Stated Goals  To  improve his balance and return to using the cane mostly, return to going to [employer] daily and working out at [employer] x 1 hour daily                       [facility] Adult PT Treatment/Exercise - 07/29/19 1710      Knee/Hip Exercises: Standing   Knee Flexion  Strengthening;Right;Left;Both;2 sets;10 reps    Knee Flexion Limitations  with orange theraband around LE; 1 set single side and 1 set alternating    Hip Flexion  Stengthening;Right;Left;Both;2 sets;10 reps;Knee bent    Hip Flexion Limitations  with orange theraband around LE; 1 set single side and 1 set alternating    Hip Abduction  Stengthening;Right;Left;Both;2 sets;10 reps;Knee straight    Abduction Limitations  with orange theraband around LE; 1 set single side and 1 set alternating    Hip Extension  Stengthening;Right;Left;Both;2 sets;10 reps;Knee straight    Extension Limitations  with orange theraband around LE; 1 set single side and 1 set alternating    Functional Squat  10 reps;2 sets    Functional Squat Limitations  with theraband around knees performing ER, hip ABD during squat    Wall Squat  2 sets;5 reps;5 seconds    Wall Squat Limitations  with theraband around knees  performing ER, hip ABD during squat    Other Standing Knee Exercises  Side stepping with back to the wall and theraband around bilat LE; 4 steps to L, 4 to R x 4 sets with son's supervision          Balance Exercises - 07/29/19 1713      Balance Exercises: Standing   Standing Eyes Closed  Narrow base of support (BOS);Head turns;Solid surface;Other reps (comment);20 secs   10 reps head nods/turns, UE support         PT Short Term Goals - 07/01/19 1231      PT SHORT TERM GOAL #1   Title  Pt will demonstrate ability to safely perform HEP with supervision of son    Time  4    Period  Weeks    Status  Revised    Target Date  07/31/19      PT SHORT TERM GOAL #2   Title  --    Time  --    Period  --    Status  --    Target Date   --      PT SHORT TERM GOAL #3   Title  Pt will improve five time sit to stand from chair to </= 18 seconds with use of UE and supervision from son    Baseline  20 seconds with use of UE    Time  4    Period  Weeks    Status  Revised    Target Date  07/31/19      PT SHORT TERM GOAL #4   Title  Pt will improve BERG to >/= 39/56    Baseline  33/56    Time  4    Period  Weeks    Status  Revised    Target Date  07/31/19      PT SHORT TERM GOAL #5   Title  Pt will improve gait velocity with cane to >/= 2.0 ft/sec and new AFO    Baseline  1.4    Time  4    Period  Weeks    Status  Revised    Target Date  07/31/19        PT Long Term Goals - 07/01/19 1232      PT LONG TERM GOAL #1   Title  Pt will be independent with final HEP    Baseline  Due to COVID    Time  8    Period  Weeks    Status  Revised    Target Date  08/30/19      PT LONG TERM GOAL #2   Title  --    Time  --    Period  --    Status  --      PT LONG TERM GOAL #3   Title  Pt will improve five time sit to stand to </= 16 seconds with use of UE and supervision    Time  8    Period  Weeks    Status  Revised    Target Date  08/30/19      PT LONG TERM GOAL #4   Title  Pt will improve BERG to >/= 42/56    Time  8    Period  Weeks    Status  Revised    Target Date  08/30/19      PT LONG TERM GOAL #5   Title  Pt will improve gait velocity with cane  and new AFO to >/= 2.5 ft/sec    Time  8    Period  Weeks    Status  Revised    Target Date  08/30/19            Plan - 07/29/19 1715    Clinical Impression Statement  Focus of treatment session shift to more concentric and eccentric mm strengthening with use of theraband resistance in standing.  Pt currently has an orange theraband which did not provide pt with sufficient resistance; may recommend that pt and son purchase set of bands with variety of resistances.  Continued to progress balance training with narrow BOS, EC adding in head turns and  nods.  Pt continues to require close supervision and UE support to perform safely.  Will continue to address in order to progress towards LTG.    Rehab Potential  Good    PT Frequency  1x / week    PT Duration  8 weeks    PT Treatment/Interventions  ADLs/Self Care Home Management;Aquatic Therapy;Electrical Stimulation;DME Instruction;Gait training;Stair training;Functional mobility training;Orthotic Fit/Training;Patient/family education;Neuromuscular re-education;Balance training;Therapeutic exercise;Therapeutic activities    PT Next Visit Plan  STG on next visit (8/21); 4 way hip with theraband, side stepping with theraband, squats with theraband, stepping forwards/backwards with theraband, focus on LE strengthening with resistance bands.  Keep working on turning at [employer]; balance reactions at the wall EO/EC    PT Home Exercise Plan  [email or web address]     Consulted and Agree with Plan of Care  Patient;Family member/caregiver    Family Member Consulted  son       Patient will benefit from skilled therapeutic intervention in order to improve the following deficits and impairments:  Abnormal gait, Decreased balance, Decreased strength, Difficulty walking, Impaired sensation  Visit Diagnosis: 1. Hemiplegia and hemiparesis following cerebral infarction affecting right dominant side (HCC)   2. Other abnormalities of gait and mobility   3. Muscle weakness (generalized)   4. Unsteadiness on feet   5. Repeated falls   6. Foot drop, left foot   7. Other disturbances of skin sensation        Problem List Patient Active Problem List   Diagnosis Date Noted  . Gait disturbance, post-stroke   . Slow transit constipation   . Small vessel disease ([place]) 10/19/2018  . Benign essential HTN   . Stage 2 chronic kidney disease   . Sequela of lacunar infarction   . CVA (cerebral vascular accident) ([place]) 10/17/2018  . Burning sensation of the foot 04/12/2015  . Leg weakness 01/08/2015  . Carpal tunnel  syndrome 12/26/2014  . Dropfoot 12/26/2014  . Neuralgia neuritis, sciatic nerve 12/26/2014  . Diabetes mellitus ([place]) 08/24/2014  . BP (high blood pressure) 08/24/2014  . HLD (hyperlipidemia) 08/24/2014  . H/O coronary artery bypass surgery 08/24/2014    Rico Junker, PT, DPT 07/29/19    5:20 PM    [facility] [address] [place], [place], [address] Phone: [phone]   Fax:  [phone]  Name: Zachary Spence MRN: [id number] Date of Birth: 04/21/1923

## 2019-08-12 ENCOUNTER — Other Ambulatory Visit: Payer: Self-pay

## 2019-08-12 ENCOUNTER — Ambulatory Visit: Payer: Medicare Other | Admitting: Physical Therapy

## 2019-08-12 ENCOUNTER — Encounter: Payer: Self-pay | Admitting: Physical Therapy

## 2019-08-12 DIAGNOSIS — R2681 Unsteadiness on feet: Secondary | ICD-10-CM

## 2019-08-12 DIAGNOSIS — R296 Repeated falls: Secondary | ICD-10-CM

## 2019-08-12 DIAGNOSIS — I69351 Hemiplegia and hemiparesis following cerebral infarction affecting right dominant side: Secondary | ICD-10-CM

## 2019-08-12 DIAGNOSIS — R2689 Other abnormalities of gait and mobility: Secondary | ICD-10-CM

## 2019-08-12 DIAGNOSIS — R208 Other disturbances of skin sensation: Secondary | ICD-10-CM

## 2019-08-12 DIAGNOSIS — M21372 Foot drop, left foot: Secondary | ICD-10-CM

## 2019-08-12 DIAGNOSIS — M6281 Muscle weakness (generalized): Secondary | ICD-10-CM

## 2019-08-12 NOTE — Therapy (Signed)
[facility] [address] [place], [place], [address] Phone: [phone]   Fax:  [phone]  Physical Therapy Treatment  Patient Details  Name: Zachary Spence MRN: [id number] Date of Birth: [birth year] Referring Provider (PT): Charlett Blake, MD   Encounter Date: 08/12/2019   I connected with  Zachary Spence on 08/12/19 by a video enabled telehealth application and verified that I am speaking with the correct person using two identifiers.   I discussed the limitations of evaluation and management by telehealth. The patient expressed understanding and agreed to proceed.   PT End of Session - 08/12/19 1726    Visit Number  21    Number of Visits  23    Date for PT Re-Evaluation  08/30/19    Authorization Type  Medicare and AARP -covered 100%, 10th visit PN    PT Start Time  1615    PT Stop Time  1700    PT Time Calculation (min)  45 min    Activity Tolerance  Patient tolerated treatment well    Behavior During Therapy  WFL for tasks assessed/performed       Past Medical History:  Diagnosis Date  . Bowel obstruction ([place])   . Diabetes mellitus without complication ([place])   . Hypertension   . Non Hodgkin's lymphoma ([place])   . Stroke [facility])     Past Surgical History:  Procedure Laterality Date  . APPENDECTOMY    . CORONARY ARTERY BYPASS GRAFT    . SPLENECTOMY      There were no vitals filed for this visit.  Subjective Assessment - 08/12/19 1721    Subjective  Still doing HEP 2x/day; broke the theraband doing exercises    Pertinent History  Non-Hodgkin's lymphoma, HTN, DM with bilat peripheral neuropathy with L foot slap - wears a posterior leaf spring AFO x 4 years, bilat carpal tunnel, CABG and falls    Limitations  Walking    Patient Stated Goals  To improve his balance and return to using the cane mostly, return to going to [employer] daily and working out at [employer] x 1 hour daily    Currently in  Pain?  No/denies         [facility] PT Assessment - 08/12/19 1646      Standardized Balance Assessment   Standardized Balance Assessment  Berg Balance Test;Five Times Sit to Stand;10 meter walk test    Five times sit to stand comments   24 seconds without the use of UE from dining room chair    10 Meter Walk  5.38 seconds over 10 ft - 1.85 ft/sec without AD      Berg Balance Test   Sit to Stand  Able to stand without using hands and stabilize independently    Standing Unsupported  Able to stand safely 2 minutes    Sitting with Back Unsupported but Feet Supported on Floor or Stool  Able to sit safely and securely 2 minutes    Stand to Sit  Sits safely with minimal use of hands    Transfers  Able to transfer safely, minor use of hands    Standing Unsupported with Eyes Closed  Able to stand 3 seconds    Standing Unsupported with Feet Together  Able to place feet together independently and stand for 1 minute with supervision    From Standing, Reach Forward with Outstretched Arm  Can reach forward >12 cm safely (5")    From  Standing Position, Pick up Object from Floor  Able to pick up shoe, needs supervision    From Standing Position, Turn to Look Behind Over each Shoulder  Turn sideways only but maintains balance    Turn 360 Degrees  Able to turn 360 degrees safely but slowly    Standing Unsupported, Alternately Place Feet on Step/Stool  Able to complete >2 steps/needs minimal assist    Standing Unsupported, One Foot in Front  Able to take small step independently and hold 30 seconds    Standing on One Leg  Tries to lift leg/unable to hold 3 seconds but remains standing independently    Total Score  39    Berg comment:  39/56                        Balance Exercises - 08/12/19 1709      Balance Exercises: Standing   Standing Eyes Closed  Wide (BOA);Solid surface;4 reps;10 secs    Turning  Right;Left;10 reps   upper trunk rotation, look over shoulder   Other Standing  Exercises  Performed controlled weight shifting anterior/posterior in kitchen first with feet staggered with UE support and EO, no UE support and EO and then EC with UE support x 10 reps with each foot forwards, son guarding.  Then transitioned to standing with feet apart and performing anterior/posterior weight shifting with UE support, EO; no UE support, EO; UE support, EC.        Balance Exercises: Standing   Turning Limitations  Performed upper body turning, looking over shoulder (feet stable) rotating to L and R rotating hand on towel across counter and across midline for increased trunk rotation.  10 reps to each side with cues to look over the shoulder        PT Education - 08/12/19 1722    Education Details  progress towards goals; areas to continue to focus on; standing trunk rotation at counter, will mail new theraband    Person(s) Educated  Patient;Child(ren)    Methods  Explanation;Demonstration    Comprehension  Verbalized understanding;Returned demonstration       PT Short Term Goals - 08/12/19 1726      PT SHORT TERM GOAL #1   Title  Pt will demonstrate ability to safely perform HEP with supervision of son    Time  4    Period  Weeks    Status  Achieved    Target Date  07/31/19      PT SHORT TERM GOAL #3   Title  Pt will improve five time sit to stand from chair to </= 18 seconds with use of UE and supervision from son    Baseline  24 seconds WITHOUT use of UE    Time  4    Period  Weeks    Status  Partially Met    Target Date  07/31/19      PT SHORT TERM GOAL #4   Title  Pt will improve BERG to >/= 39/56    Baseline  39/56    Time  4    Period  Weeks    Status  Achieved    Target Date  07/31/19      PT SHORT TERM GOAL #5   Title  Pt will improve gait velocity with cane to >/= 2.0 ft/sec and new AFO    Baseline  1.8    Time  4    Period  Weeks  Status  Partially Met    Target Date  07/31/19        PT Long Term Goals - 07/01/19 1232      PT LONG  TERM GOAL #1   Title  Pt will be independent with final HEP    Baseline  Due to COVID    Time  8    Period  Weeks    Status  Revised    Target Date  08/30/19      PT LONG TERM GOAL #2   Title  --    Time  --    Period  --    Status  --      PT LONG TERM GOAL #3   Title  Pt will improve five time sit to stand to </= 16 seconds with use of UE and supervision    Time  8    Period  Weeks    Status  Revised    Target Date  08/30/19      PT LONG TERM GOAL #4   Title  Pt will improve BERG to >/= 42/56    Time  8    Period  Weeks    Status  Revised    Target Date  08/30/19      PT LONG TERM GOAL #5   Title  Pt will improve gait velocity with cane and new AFO to >/= 2.5 ft/sec    Time  8    Period  Weeks    Status  Revised    Target Date  08/30/19            Plan - 08/12/19 1727    Clinical Impression Statement  Initiated assessment of progress towards STG.  Pt is making steady progress and has met 2/4 STG, partially meeting other two.  Pt is demonstrating improvements in dynamic standing balance, gait velocity and LE strength.  Pt continues to have increased difficulty maintaining balance with rotation and with decreased vision; will continue to address to continue to decrease falls risk.    Rehab Potential  Good    PT Frequency  1x / week    PT Duration  8 weeks    PT Treatment/Interventions  ADLs/Self Care Home Management;Aquatic Therapy;Electrical Stimulation;DME Instruction;Gait training;Stair training;Functional mobility training;Orthotic Fit/Training;Patient/family education;Neuromuscular re-education;Balance training;Therapeutic exercise;Therapeutic activities    PT Next Visit Plan  mailed theraband - repeat 4 way hip with new theraband, side stepping with theraband, squats with theraband, stepping forwards/backwards with theraband, focus on LE strengthening with resistance bands.  Keep working on turning at [employer]; balance reactions at the wall EO/EC    PT Home  Exercise Plan  [id number]     Consulted and Agree with Plan of Care  Patient;Family member/caregiver    Family Member Consulted  son       Patient will benefit from skilled therapeutic intervention in order to improve the following deficits and impairments:  Abnormal gait, Decreased balance, Decreased strength, Difficulty walking, Impaired sensation  Visit Diagnosis: Hemiplegia and hemiparesis following cerebral infarction affecting right dominant side (HCC)  Other abnormalities of gait and mobility  Muscle weakness (generalized)  Unsteadiness on feet  Repeated falls  Foot drop, left foot  Other disturbances of skin sensation     Problem List Patient Active Problem List   Diagnosis Date Noted  . Gait disturbance, post-stroke   . Slow transit constipation   . Small vessel disease ([place]) 10/19/2018  . Benign essential HTN   . Stage 2  chronic kidney disease   . Sequela of lacunar infarction   . CVA (cerebral vascular accident) ([place]) 10/17/2018  . Burning sensation of the foot 04/12/2015  . Leg weakness 01/08/2015  . Carpal tunnel syndrome 12/26/2014  . Dropfoot 12/26/2014  . Neuralgia neuritis, sciatic nerve 12/26/2014  . Diabetes mellitus ([place]) 08/24/2014  . BP (high blood pressure) 08/24/2014  . HLD (hyperlipidemia) 08/24/2014  . H/O coronary artery bypass surgery 08/24/2014    Rico Junker, PT, DPT 08/12/19    5:31 PM    [facility] [address] [place], [place], [address] Phone: [phone]   Fax:  [phone]  Name: Zachary Spence MRN: [id number] Date of Birth: 1923-04-21

## 2019-08-15 ENCOUNTER — Telehealth: Payer: Self-pay | Admitting: Cardiovascular Disease

## 2019-08-15 NOTE — Telephone Encounter (Signed)
New Message:  Patient wants to know if his Son will be able to come back into his appointment with him. His son is his form of transportation, and the patient lives with his son. IF that is not allowed, please let the patient know.

## 2019-08-15 NOTE — Telephone Encounter (Signed)
Spoke with pt all questions answered

## 2019-08-15 NOTE — Telephone Encounter (Signed)
LEFT DETAILED MESSAGE ABOUT OV VISITORS-NO VISITORS ALLOWED BRING UP TO APPT AND WILL HAVE TO WAIT IN THE CAR UNTIL DONE. WEAR MASK

## 2019-08-17 ENCOUNTER — Other Ambulatory Visit: Payer: Self-pay

## 2019-08-17 ENCOUNTER — Encounter: Payer: Self-pay | Admitting: Cardiovascular Disease

## 2019-08-17 ENCOUNTER — Ambulatory Visit (INDEPENDENT_AMBULATORY_CARE_PROVIDER_SITE_OTHER): Payer: Medicare Other | Admitting: Cardiovascular Disease

## 2019-08-17 VITALS — BP 107/66 | HR 87 | Ht 71.0 in | Wt 183.2 lb

## 2019-08-17 DIAGNOSIS — E785 Hyperlipidemia, unspecified: Secondary | ICD-10-CM | POA: Diagnosis not present

## 2019-08-17 DIAGNOSIS — E118 Type 2 diabetes mellitus with unspecified complications: Secondary | ICD-10-CM | POA: Diagnosis not present

## 2019-08-17 DIAGNOSIS — Z8673 Personal history of transient ischemic attack (TIA), and cerebral infarction without residual deficits: Secondary | ICD-10-CM

## 2019-08-17 DIAGNOSIS — Z951 Presence of aortocoronary bypass graft: Secondary | ICD-10-CM | POA: Diagnosis not present

## 2019-08-17 DIAGNOSIS — Z794 Long term (current) use of insulin: Secondary | ICD-10-CM

## 2019-08-17 DIAGNOSIS — I2510 Atherosclerotic heart disease of native coronary artery without angina pectoris: Secondary | ICD-10-CM

## 2019-08-17 MED ORDER — METOPROLOL SUCCINATE ER 25 MG PO TB24: 12.5000 mg | ORAL_TABLET | Freq: Every day | ORAL | 3 refills | Status: DC

## 2019-08-17 NOTE — Progress Notes (Signed)
Cardiology Office Note    Date:  08/17/2019   ID:  Zachary Spence, DOB 1923/04/12, MRN [id number]  PCP:  Merrilee Seashore, MD  Cardiologist:  Shelva Majestic, MD   New cardiology evaluation  History of Present Illness:  Zachary Spence is a 85 y.o. male who presents to the office today to establish cardiology care with me.  Zachary Spence has known coronary artery disease and underwent CABG revascularization surgery x5 in 1999.  He has been followed by Dr. Trellis Moment at the [facility] clinic.  I do not have any of the records from the [facility] clinic.  Zachary Spence has a history of type 2 diabetes mellitus as well as hyperlipidemia.  He suffered a lacunar stroke in October 2019 and an MRI at that time showed a small focus of acute ischemia within the posterior left corona radiata.  He also had findings of chronic small vessel ischemia including multiple old lacunar infarcts.  He is recovering well with minimal weakness on the right side.  He has had some mild neuropathy.  He has left foot drop and continues to wear a foot brace.  He had recently begun to establish his care in [place].   He had undergone an echo Doppler study in October 2019 which showed an EF of 55 to 60% and there was evidence for mild left ventricular hypertrophy.  His aortic valve was thickened and there was evidence for mild aortic stenosis with a mean gradient of 13 and peak gradient 24.  There was evidence for mitral annular calcification.  He has reestablished his primary care with Dr. Felicie Morn on at [facility].  He had laboratory last Friday.  Presently he denies any episodes of chest pain or tightness.  He denies any presyncope or syncope.  He is unaware of any significant dyspnea.  He presents for evaluation.  Past Medical History:  Diagnosis Date  . Bowel obstruction ([place])   . Diabetes mellitus without complication ([place])   . Hypertension   . Non Hodgkin's lymphoma ([place])   . Stroke [facility])     Past Surgical  History:  Procedure Laterality Date  . APPENDECTOMY    . CORONARY ARTERY BYPASS GRAFT    . SPLENECTOMY      Current Medications: Outpatient Medications Prior to Visit  Medication Sig Dispense Refill  . acetaminophen (TYLENOL) 325 MG tablet Take 2 tablets (650 mg total) by mouth every 4 (four) hours as needed for mild pain (or temp > 37.5 C (99.5 F)).    . ASPIRIN 81 PO Take by mouth.    . calcium carbonate (OS-CAL - DOSED IN MG OF ELEMENTAL CALCIUM) 1250 (500 Ca) MG tablet Take 1 tablet (1,250 mg total) by mouth daily with breakfast. 30 tablet 1  . folic acid (FOLVITE) 1 MG tablet Take 1 tablet (1 mg total) by mouth daily. 30 tablet 1  . gentamicin cream (GARAMYCIN) 0.1 % Apply 1 application topically 2 (two) times daily. 15 g 1  . glipiZIDE (GLUCOTROL) 5 MG tablet Take 1 tablet (5 mg total) by mouth daily. 30 tablet 1  . Multiple Vitamin (MULTIVITAMIN WITH MINERALS) TABS tablet Take 1 tablet by mouth daily.    . Multiple Vitamins-Minerals (PRESERVISION AREDS 2 PO) Take 1 capsule by mouth daily.     . Omega-3 Fatty Acids (FISH OIL) 1000 MG CAPS Take 1,000 mg by mouth daily.     . rosuvastatin (CRESTOR) 5 MG tablet Take 1 tablet (5 mg total) by mouth daily at  6 PM. 30 tablet 0   No facility-administered medications prior to visit.      Allergies:   Erythromycin   Social History   Socioeconomic History  . Marital status: Widowed    Spouse name: Not on file  . Number of children: Not on file  . Years of education: Not on file  . Highest education level: Not on file  Occupational History  . Not on file  Social Needs  . Financial resource strain: Not on file  . Food insecurity    Worry: Not on file    Inability: Not on file  . Transportation needs    Medical: Not on file    Non-medical: Not on file  Tobacco Use  . Smoking status: Former Research scientist (life sciences)  . Smokeless tobacco: Never Used  Substance and Sexual Activity  . Alcohol use: No    Alcohol/week: 0.0 standard drinks  . Drug  use: Never  . Sexual activity: Not on file  Lifestyle  . Physical activity    Days per week: Not on file    Minutes per session: Not on file  . Stress: Not on file  Relationships  . Social Herbalist on phone: Not on file    Gets together: Not on file    Attends religious service: Not on file    Active member of club or organization: Not on file    Attends meetings of clubs or organizations: Not on file    Relationship status: Not on file  Other Topics Concern  . Not on file  Social History Narrative  . Not on file    Social history is notable in that he was born in [place] and raised in [place].  He has been in [place] since 1941.  He had worked for [employer] for 37 years.  He retired at age 14-1/2.  He is widowed since 2007.  He had 3 children, 1 son died with an MI at age 23, a daughter died at age 70, and he currently lives with his remaining son.  Family History: Family history is notable that his parents are deceased.  ROS General: Negative; No fevers, chills, or night sweats;  HEENT: Negative; No changes in vision or hearing, sinus congestion, difficulty swallowing Pulmonary: Negative; No cough, wheezing, shortness of breath, hemoptysis Cardiovascular: Negative; No chest pain, presyncope, syncope, palpitations GI: Negative; No nausea, vomiting, diarrhea, or abdominal pain GU: Negative; No dysuria, hematuria, or difficulty voiding Musculoskeletal: Negative; no myalgias, joint pain, or weakness Hematologic/Oncology: Status post splenectomy Endocrine: Negative; no heat/cold intolerance; no diabetes Neuro: Prior CVA in 2019 and documentation of several lacunar infarcts. Skin: Negative; No rashes or skin lesions Psychiatric: Negative; No behavioral problems, depression Sleep: Negative; No snoring, daytime sleepiness, hypersomnolence, bruxism, restless legs, hypnogognic hallucinations, no cataplexy Other comprehensive 14 point system review is  negative.   PHYSICAL EXAM:   VS:  BP 107/66   Pulse 87   Ht _0  (1.803 m)   Wt 183 lb 3.2 oz (83.1 kg)   SpO2 94%   BMI 25.55 kg/m     Repeat blood pressure by me 124/74  Wt Readings from Last 3 Encounters:  08/17/19 183 lb 3.2 oz (83.1 kg)  02/04/19 185 lb (83.9 kg)  12/24/18 184 lb (83.5 kg)    General: Alert, oriented, no distress.  Skin: normal turgor, no rashes, warm and dry HEENT: Normocephalic, atraumatic. Pupils equal round and reactive to light; sclera anicteric; extraocular muscles intact; Fundi  without hemorrhages or exudates; arcus senilis Nose without nasal septal hypertrophy Mouth/Parynx benign; Mallinpatti scale 2 Neck: No JVD, no carotid bruits; normal carotid upstroke Lungs: clear to ausculatation and percussion; no wheezing or rales Chest wall: without tenderness to palpitation Heart: PMI not displaced, RRR, s1 s2 normal, 2/6 systolic murmur, no diastolic murmur, no rubs, gallops, thrills, or heaves Abdomen: soft, nontender; no hepatosplenomehaly, BS+; abdominal aorta nontender and not dilated by palpation. Back: no CVA tenderness Pulses 2+ Musculoskeletal: full range of motion, normal strength, no joint deformities Extremities: Left foot brace for his foot drop;no clubbing cyanosis or edema, Homan's sign negative  Neurologic: grossly nonfocal; Cranial nerves grossly wnl Psychologic: Normal mood and affect   Studies/Labs Reviewed:   EKG:  EKG is ordered today.  ECG (independently read by me): NSR at 87; early transition; normal intervals  Recent Labs: BMP Latest Ref Rng & Units 10/20/2018 10/18/2018 10/18/2018  Glucose 70 - 99 mg/dL 132(H) 94 -  BUN 8 - 23 mg/dL 17 21 -  Creatinine 0.61 - 1.24 mg/dL 1.12 1.06 1.12  Sodium 135 - 145 mmol/L 140 141 -  Potassium 3.5 - 5.1 mmol/L 4.0 3.8 -  Chloride 98 - 111 mmol/L 102 109 -  CO2 22 - 32 mmol/L 29 22 -  Calcium 8.9 - 10.3 mg/dL 9.4 8.9 -     Hepatic Function Latest Ref Rng & Units 10/20/2018  10/18/2018 10/17/2018  Total Protein 6.5 - 8.1 g/dL 7.1 6.3(L) 7.1  Albumin 3.5 - 5.0 g/dL 3.9 3.7 4.0  AST 15 - 41 U/L _0 ALT 0 - 44 U/L _1 Alk Phosphatase 38 - 126 U/L 59 48 53  Total Bilirubin 0.3 - 1.2 mg/dL 1.2 0.9 0.6    CBC Latest Ref Rng & Units 10/20/2018 10/18/2018 10/18/2018  WBC 4.0 - 10.5 K/uL 9.3 8.1 8.1  Hemoglobin 13.0 - 17.0 g/dL 15.2 13.8 14.4  Hematocrit 39.0 - 52.0 % 47.3 43.4 44.7  Platelets 150 - 400 K/uL 299 281 283   Lab Results  Component Value Date   MCV 95.2 10/20/2018   MCV 96.7 10/18/2018   MCV 95.1 10/18/2018   No results found for: TSH Lab Results  Component Value Date   HGBA1C 6.5 (H) 10/18/2018     BNP No results found for: BNP  ProBNP No results found for: PROBNP   Lipid Panel     Component Value Date/Time   CHOL 160 12/07/2018 1317   TRIG 195 (H) 12/07/2018 1317   HDL 42 12/07/2018 1317   CHOLHDL 3.8 12/07/2018 1317   CHOLHDL 4.9 10/18/2018 0313   VLDL 22 10/18/2018 0313   LDLCALC 79 12/07/2018 1317     RADIOLOGY: No results found.   Additional studies/ records that were reviewed today include:  I reviewed the patient's records from [facility] clinic on March 10, 2019.  His echo Doppler study from October 17, 2018 was reviewed.  I reviewed records of Dr. Leonie Man.   ASSESSMENT:    No diagnosis found.   PLAN:  Zachary Spence is a young appearing 85 year old gentleman who apparently underwent CABG revascularization surgery x5 in 1999 at [facility].  At that time, he had undergone a treadmill test which was abnormal leading to catheterization demonstration of multivessel CAD.  He is unaware of ever suffering a myocardial infarction.  He has seen Dr. Chilton Greathouse in follow-up for his cardiovascular care.  I will try to obtain records from the [facility] clinic.  He states  he had been on atenolol for many years but apparently stopped this medication last November.  Presently, he notes that his pulse typically runs  in the 80s to 90s.  His blood pressure on recheck by me was stable at 124/74.  I have elected to initiate very low-dose metoprolol succinate at 12.5 mg.  He is diabetic on glipizide.  He also has recently been started on rosuvastatin 5 mg.  He had previously suffered lacunar infarcts and more recently in October 2019 again suffered a lacunar infarct within the posterior left corona radiata.  There was no hemorrhage or mass-effect.  He had apparently a complete set of laboratory by his primary physician last week.  I will try to obtain these records.  Target LDL is less than 70.  His blood pressure today is stable.  He is not having any anginal symptomatology.  He has left foot drop is residual from his stroke for which he wears a foot brace.  He has a 2/6 systolic murmur compatible with his previously documented aortic valve stenosis.  His last echo Doppler study was done in October 2019.  I will see him in 6 months for reevaluation and prior to that office visit I am recommending he undergo a follow-up echo Doppler assessment.  I will see him in follow-up and further recommendations will be made at that time.   Medication Adjustments/Labs and Tests Ordered: Current medicines are reviewed at length with the patient today.  Concerns regarding medicines are outlined above.  Medication changes, Labs and Tests ordered today are listed in the Patient Instructions below. Patient Instructions  Medication Instructions:    METOPROLOL SUCCINATE   If you need a refill on your cardiac medications before your next appointment, please call your pharmacy.   Lab work:     If you have labs (blood work) drawn today and your tests are completely normal, you will receive your results only by: [facility] Kitchen MyChart Message (if you have MyChart) OR . A paper copy in the mail If you have any lab test that is abnormal or we need to change your treatment, we will call you to review the results.  Testing/Procedures:   Follow-Up:  At [facility], you and your health needs are our priority.  As part of our continuing mission to provide you with exceptional heart care, we have created designated Provider Care Teams.  These Care Teams include your primary Cardiologist (physician) and Advanced Practice Providers (APPs -  Physician Assistants and Nurse Practitioners) who all work together to provide you with the care you need, when you need it. .      Any Other Special Instructions Will Be Listed Below (If Applicable).    Signed, Shelva Majestic, MD  08/17/2019 3:48 PM    [facility] [address], [place], [place], [place]  [address] Phone: [phone]

## 2019-08-17 NOTE — Patient Instructions (Addendum)
Medication Instructions:    START  METOPROLOL SUCCINATE  12.5 MG ( 1/2 TABLET OF 25 MG ) ONCE A DAY  If you need a refill on your cardiac medications before your next appointment, please call your pharmacy.   Lab work: NO TNEEDED      Testing/Procedures:  WILL BE SCHEDULE IN 6 MONTH - FEB 2021  [address] [address]  Your physician has requested that you have an echocardiogram. Echocardiography is a painless test that uses sound waves to create images of your heart. It provides your doctor with information about the size and shape of your heart and how well your heart's chambers and valves are working. This procedure takes approximately one hour. There are no restrictions for this procedure.    Follow-Up: At [facility], you and your health needs are our priority.  As part of our continuing mission to provide you with exceptional heart care, we have created designated Provider Care Teams.  These Care Teams include your primary Cardiologist (physician) and Advanced Practice Providers (APPs -  Physician Assistants and Nurse Practitioners) who all work together to provide you with the care you need, when you need it.  DR Shelva Majestic recommends that you schedule a follow-up appointment in 6 month _ feb 2021 after echo is completed     Any Other Special Instructions Will Be Listed Below (If Applicable).

## 2019-08-19 ENCOUNTER — Ambulatory Visit: Payer: Medicare Other | Admitting: Physical Therapy

## 2019-08-19 ENCOUNTER — Other Ambulatory Visit: Payer: Self-pay

## 2019-08-19 DIAGNOSIS — R296 Repeated falls: Secondary | ICD-10-CM

## 2019-08-19 DIAGNOSIS — R208 Other disturbances of skin sensation: Secondary | ICD-10-CM

## 2019-08-19 DIAGNOSIS — R2681 Unsteadiness on feet: Secondary | ICD-10-CM

## 2019-08-19 DIAGNOSIS — I69351 Hemiplegia and hemiparesis following cerebral infarction affecting right dominant side: Secondary | ICD-10-CM | POA: Diagnosis not present

## 2019-08-19 DIAGNOSIS — M21372 Foot drop, left foot: Secondary | ICD-10-CM

## 2019-08-19 DIAGNOSIS — R2689 Other abnormalities of gait and mobility: Secondary | ICD-10-CM

## 2019-08-19 DIAGNOSIS — M6281 Muscle weakness (generalized): Secondary | ICD-10-CM

## 2019-08-19 NOTE — Therapy (Signed)
[facility] [address] [place], [place], [address] Phone: [phone]   Fax:  [phone]  Physical Therapy Treatment  Patient Details  Name: Zachary Spence MRN: [id number] Date of Birth: 09-15-1923 Referring Provider (PT): Charlett Blake, MD   Encounter Date: 08/19/2019   I connected with  Zachary Spence on 08/19/19 by a video enabled telehealth application and verified that I am speaking with the correct person using two identifiers.   I discussed the limitations of evaluation and management by telehealth. The patient expressed understanding and agreed to proceed.    PT End of Session - 08/19/19 2105    Visit Number  22    Number of Visits  23    Date for PT Re-Evaluation  08/30/19    Authorization Type  Medicare and AARP -covered 100%, 10th visit PN    PT Start Time  1615    PT Stop Time  1700    PT Time Calculation (min)  45 min    Activity Tolerance  Patient tolerated treatment well    Behavior During Therapy  WFL for tasks assessed/performed       Past Medical History:  Diagnosis Date  . Bowel obstruction ([place])   . Diabetes mellitus without complication ([place])   . Hypertension   . Non Hodgkin's lymphoma ([place])   . Stroke [facility])     Past Surgical History:  Procedure Laterality Date  . APPENDECTOMY    . CORONARY ARTERY BYPASS GRAFT    . SPLENECTOMY      There were no vitals filed for this visit.  Subjective Assessment - 08/19/19 2045    Subjective  Feels like he is making progress. No changes.  Went to see cardiologist - is now on a beta blocker.    Pertinent History  Non-Hodgkin's lymphoma, HTN, DM with bilat peripheral neuropathy with L foot slap - wears a posterior leaf spring AFO x 4 years, bilat carpal tunnel, CABG and falls    Limitations  Walking    Patient Stated Goals  To improve his balance and return to using the cane mostly, return to going to [employer] daily and working out at  [employer] x 1 hour daily    Currently in Pain?  No/denies                       [facility] Adult PT Treatment/Exercise - 08/19/19 2100      Knee/Hip Exercises: Standing   Knee Flexion  Strengthening;Right;Left;1 set;10 reps    Knee Flexion Limitations  with green theraband around LE    Hip Flexion  Stengthening;Right;Left;1 set;10 reps    Hip Flexion Limitations  with green theraband around LE; after 10 reps each side performed 3 reps each side isometric hold in the air for SLS x 10 seconds each side    Hip Abduction  Stengthening;Right;Left;1 set;10 reps;Knee straight    Abduction Limitations  against resistance of green theraband; after 10 reps each side performed isometric hold in the air x 10 seconds x 3 reps each side    Hip Extension  Stengthening;Right;Left;1 set;10 reps;Knee straight    Extension Limitations  against resistance of green theraband    Functional Squat  10 reps;1 set    Functional Squat Limitations  with theraband around thighs, pushing out into ABD/ER during squat          Balance Exercises - 08/19/19 2054      Balance Exercises:  Standing   Stepping Strategy  Posterior;Lateral;10 reps   EO, EC resistance of theraband   Sidestepping  Other reps (comment);Theraband   EO, against resistance of green theraband   Other Standing Exercises  Standing at counter top with theraband around pelvis with son pulling slightly posterior pt cued to use ankle strategy to resiste and pull forwards x 10 reps with EO, 10 reps with EC.  Changed to stepping strategy with EO, EC.  Changed to staggered stance, still with band pulling posterior and pt weight shifting to front foot against resistance of theraband x 10 reps each side, EO and EC.  Transitioned to theraband placed to pull against patient laterally with pt shifting to opposite side against resistance of the band x 10 reps each side, EO and EC.  Transitioned to side stepping against band with EO x 10 reps each side.             PT Short Term Goals - 08/12/19 1726      PT SHORT TERM GOAL #1   Title  Pt will demonstrate ability to safely perform HEP with supervision of son    Time  4    Period  Weeks    Status  Achieved    Target Date  07/31/19      PT SHORT TERM GOAL #3   Title  Pt will improve five time sit to stand from chair to </= 18 seconds with use of UE and supervision from son    Baseline  24 seconds WITHOUT use of UE    Time  4    Period  Weeks    Status  Partially Met    Target Date  07/31/19      PT SHORT TERM GOAL #4   Title  Pt will improve BERG to >/= 39/56    Baseline  39/56    Time  4    Period  Weeks    Status  Achieved    Target Date  07/31/19      PT SHORT TERM GOAL #5   Title  Pt will improve gait velocity with cane to >/= 2.0 ft/sec and new AFO    Baseline  1.8    Time  4    Period  Weeks    Status  Partially Met    Target Date  07/31/19        PT Long Term Goals - 07/01/19 1232      PT LONG TERM GOAL #1   Title  Pt will be independent with final HEP    Baseline  Due to COVID    Time  8    Period  Weeks    Status  Revised    Target Date  08/30/19      PT LONG TERM GOAL #2   Title  --    Time  --    Period  --    Status  --      PT LONG TERM GOAL #3   Title  Pt will improve five time sit to stand to </= 16 seconds with use of UE and supervision    Time  8    Period  Weeks    Status  Revised    Target Date  08/30/19      PT LONG TERM GOAL #4   Title  Pt will improve BERG to >/= 42/56    Time  8    Period  Weeks    Status  Revised    Target Date  08/30/19      PT LONG TERM GOAL #5   Title  Pt will improve gait velocity with cane and new AFO to >/= 2.5 ft/sec    Time  8    Period  Weeks    Status  Revised    Target Date  08/30/19            Plan - 08/19/19 2106    Clinical Impression Statement  Pt has not received theraband mailed to patient earlier this week.  Pt does still have older green theraband.  Utilized theraband to  progress and add to balance reaction training and progressed 4 way hip strengthening exercises to use of green theraband.  Pt tolerated well and performed all exercises without any rest break.  Will continue to progress towards LTG.    Rehab Potential  Good    PT Frequency  1x / week    PT Duration  8 weeks    PT Treatment/Interventions  ADLs/Self Care Home Management;Aquatic Therapy;Electrical Stimulation;DME Instruction;Gait training;Stair training;Functional mobility training;Orthotic Fit/Training;Patient/family education;Neuromuscular re-education;Balance training;Therapeutic exercise;Therapeutic activities    PT Next Visit Plan  did they receive theraband - keep working on quarter turns; repeat 4 way hip with new theraband, side stepping with theraband, squats with theraband, stepping forwards/backwards with theraband, focus on LE strengthening with resistance bands. balance reactions at the wall EO/EC    PT Home Exercise Plan  [email or web address]     Consulted and Agree with Plan of Care  Patient;Family member/caregiver    Family Member Consulted  son       Patient will benefit from skilled therapeutic intervention in order to improve the following deficits and impairments:  Abnormal gait, Decreased balance, Decreased strength, Difficulty walking, Impaired sensation  Visit Diagnosis: Hemiplegia and hemiparesis following cerebral infarction affecting right dominant side (HCC)  Other abnormalities of gait and mobility  Muscle weakness (generalized)  Unsteadiness on feet  Repeated falls  Foot drop, left foot  Other disturbances of skin sensation     Problem List Patient Active Problem List   Diagnosis Date Noted  . Gait disturbance, post-stroke   . Slow transit constipation   . Small vessel disease ([place]) 10/19/2018  . Benign essential HTN   . Stage 2 chronic kidney disease   . Sequela of lacunar infarction   . CVA (cerebral vascular accident) ([place]) 10/17/2018  . Burning sensation of  the foot 04/12/2015  . Leg weakness 01/08/2015  . Carpal tunnel syndrome 12/26/2014  . Dropfoot 12/26/2014  . Neuralgia neuritis, sciatic nerve 12/26/2014  . Diabetes mellitus ([place]) 08/24/2014  . BP (high blood pressure) 08/24/2014  . HLD (hyperlipidemia) 08/24/2014  . H/O coronary artery bypass surgery 08/24/2014    Rico Junker, PT, DPT 08/19/19    9:11 PM    [facility] [address] [place], [place], [address] Phone: [phone]   Fax:  [phone]  Name: RONNEY HONEYWELL MRN: [id number] Date of Birth: 14-Feb-1923

## 2019-08-23 ENCOUNTER — Other Ambulatory Visit: Payer: Self-pay

## 2019-08-23 ENCOUNTER — Encounter: Payer: Self-pay | Admitting: Cardiovascular Disease

## 2019-08-23 ENCOUNTER — Encounter: Payer: Self-pay | Admitting: Podiatry

## 2019-08-23 ENCOUNTER — Ambulatory Visit (INDEPENDENT_AMBULATORY_CARE_PROVIDER_SITE_OTHER): Payer: Medicare Other | Admitting: Podiatry

## 2019-08-23 DIAGNOSIS — B351 Tinea unguium: Secondary | ICD-10-CM | POA: Diagnosis not present

## 2019-08-23 DIAGNOSIS — M79676 Pain in unspecified toe(s): Secondary | ICD-10-CM

## 2019-08-23 DIAGNOSIS — M21371 Foot drop, right foot: Secondary | ICD-10-CM

## 2019-08-23 DIAGNOSIS — E119 Type 2 diabetes mellitus without complications: Secondary | ICD-10-CM

## 2019-08-23 NOTE — Progress Notes (Signed)
Complaint:  Visit Type: Patient returns to my office for continued preventative foot care services. Complaint: Patient states" my nails have grown long and thick and become painful to walk and wear shoes" Patient has been diagnosed with DM with no foot complications. The patient presents for preventative foot care services. No changes to ROS.  Patient has self avulsed both great toenails.  Podiatric Exam: Vascular: dorsalis pedis and posterior tibial pulses are palpable bilateral. Capillary return is immediate. Temperature gradient is WNL. Skin turgor WNL  Sensorium: Normal Semmes Weinstein monofilament test. Normal tactile sensation bilaterally. Nail Exam: Pt has thick disfigured discolored nails with subungual debris noted bilateral entire nail second  through fifth toenails Ulcer Exam: There is no evidence of ulcer or pre-ulcerative changes or infection. Orthopedic Exam: Muscle tone and strength are WNL. No limitations in general ROM. No crepitus or effusions noted. Foot type and digits show no abnormalities. Bony prominences are unremarkable. Skin: No Porokeratosis. No infection or ulcers  Diagnosis:  Onychomycosis, , Pain in right toe, pain in left toes  Treatment & Plan Procedures and Treatment: Consent by patient was obtained for treatment procedures.   Debridement of mycotic and hypertrophic toenails, 1 through 5 bilateral and clearing of subungual debris. No ulceration, no infection noted.  Return Visit-Office Procedure: Patient instructed to return to the office for a follow up visit 3 months for continued evaluation and treatment.    Gardiner Barefoot DPM

## 2019-08-26 ENCOUNTER — Ambulatory Visit: Payer: Medicare Other | Attending: Physical Medicine & Rehabilitation | Admitting: Physical Therapy

## 2019-08-26 ENCOUNTER — Other Ambulatory Visit: Payer: Self-pay

## 2019-08-26 DIAGNOSIS — R208 Other disturbances of skin sensation: Secondary | ICD-10-CM | POA: Diagnosis present

## 2019-08-26 DIAGNOSIS — R296 Repeated falls: Secondary | ICD-10-CM | POA: Insufficient documentation

## 2019-08-26 DIAGNOSIS — M21372 Foot drop, left foot: Secondary | ICD-10-CM | POA: Diagnosis present

## 2019-08-26 DIAGNOSIS — R2681 Unsteadiness on feet: Secondary | ICD-10-CM | POA: Diagnosis present

## 2019-08-26 DIAGNOSIS — R2689 Other abnormalities of gait and mobility: Secondary | ICD-10-CM | POA: Insufficient documentation

## 2019-08-26 DIAGNOSIS — I69351 Hemiplegia and hemiparesis following cerebral infarction affecting right dominant side: Secondary | ICD-10-CM | POA: Diagnosis not present

## 2019-08-26 DIAGNOSIS — M6281 Muscle weakness (generalized): Secondary | ICD-10-CM | POA: Insufficient documentation

## 2019-08-30 ENCOUNTER — Ambulatory Visit (INDEPENDENT_AMBULATORY_CARE_PROVIDER_SITE_OTHER): Payer: Medicare Other | Admitting: Neurology

## 2019-08-30 ENCOUNTER — Other Ambulatory Visit: Payer: Self-pay

## 2019-08-30 ENCOUNTER — Encounter: Payer: Self-pay | Admitting: Neurology

## 2019-08-30 VITALS — BP 117/69 | HR 72 | Temp 97.7°F | Wt 186.0 lb

## 2019-08-30 DIAGNOSIS — I2510 Atherosclerotic heart disease of native coronary artery without angina pectoris: Secondary | ICD-10-CM

## 2019-08-30 DIAGNOSIS — I6529 Occlusion and stenosis of unspecified carotid artery: Secondary | ICD-10-CM | POA: Diagnosis not present

## 2019-08-30 NOTE — Therapy (Signed)
[facility] [address] [place], [place], [address] Phone: [phone]   Fax:  [phone]  Physical Therapy Treatment  Patient Details  Name: Zachary Spence MRN: [id number] Date of Birth: January 20, 1923 Referring Provider (PT): Charlett Blake, MD   Encounter Date: 08/26/2019   I connected with  Zachary Spence on 08/30/19 by a video enabled telehealth application and verified that I am speaking with the correct person using two identifiers.   I discussed the limitations of evaluation and management by telehealth. The patient expressed understanding and agreed to proceed.       08/26/19 1721  PT Visits / Re-Eval  Visit Number 23  Number of Visits 23  Date for PT Re-Evaluation 08/30/19  Authorization  Authorization Type Medicare and AARP -covered 100%, 10th visit PN  PT Time Calculation  PT Start Time 1615  PT Stop Time 1700  PT Time Calculation (min) 45 min  PT - End of Session  Activity Tolerance Patient tolerated treatment well  Behavior During Therapy WFL for tasks assessed/performed    Past Medical History:  Diagnosis Date  . Bowel obstruction ([place])   . Diabetes mellitus without complication ([place])   . Hypertension   . Non Hodgkin's lymphoma ([place])   . Stroke [facility])     Past Surgical History:  Procedure Laterality Date  . APPENDECTOMY    . CORONARY ARTERY BYPASS GRAFT    . SPLENECTOMY      There were no vitals filed for this visit.  Subjective Assessment - 08/30/19 1320    Subjective  Pt received package with new theraband - 3 resistance levels.  Has been using the blue band.    Pertinent History  Non-Hodgkin's lymphoma, HTN, DM with bilat peripheral neuropathy with L foot slap - wears a posterior leaf spring AFO x 4 years, bilat carpal tunnel, CABG and falls    Limitations  Walking    Patient Stated Goals  To improve his balance and return to using the cane mostly, return to going to [employer] daily and working out at the gym x 1 hour daily        Added the following exercises to patient's HEP with new theraband for resistance.  Performed in standing with theraband anchored to rail or inside of door handle with supervision of son.  Performed in standing to also address postural control and balance.  Pt performed 2 sets of each exercise with therapist observing and providing verbal cues for feedback.  Patient utilized blue and green theraband for UE exercises; utilized black for resisted deadlifts.  Standing Elbow Flexion with Resistance - 10 reps - 2x daily - 7x weekly Standing Elbow Extension with Anchored Resistance - 10 reps - 2x daily - 7x weekly Standing Row with Anchored Resistance - 10 reps - 2x daily - 7x weekly Chest Press with Anchored Resistance - 10 reps - 2x daily - 7x weekly Standing Single Arm Shoulder Flexion with Posterior Anchored Resistance - 10 reps - 2 sets - 2x daily - 7x weekly Standing Shoulder Abduction with Resistance at Hip - 10 reps - 2 sets - 2x daily - 7x weekly Deadlift with Resistance - 10 reps - 2x daily - 7x weekly Standing Trunk Rotation with Resistance - 10 reps - 2 sets - 2x daily - 7x weekly        PT Education - 08/30/19 1321    Education Details  UE strengthening exercises with theraband    Person(s)  Educated  Patient;Child(ren)    Methods  Explanation;Demonstration;Handout   handout emailed   Comprehension  Verbalized understanding;Returned demonstration       PT Short Term Goals - 08/12/19 1726      PT SHORT TERM GOAL #1   Title  Pt will demonstrate ability to safely perform HEP with supervision of son    Time  4    Period  Weeks    Status  Achieved    Target Date  07/31/19      PT SHORT TERM GOAL #3   Title  Pt will improve five time sit to stand from chair to </= 18 seconds with use of UE and supervision from son    Baseline  24 seconds WITHOUT use of UE    Time  4    Period  Weeks    Status  Partially Met     Target Date  07/31/19      PT SHORT TERM GOAL #4   Title  Pt will improve BERG to >/= 39/56    Baseline  39/56    Time  4    Period  Weeks    Status  Achieved    Target Date  07/31/19      PT SHORT TERM GOAL #5   Title  Pt will improve gait velocity with cane to >/= 2.0 ft/sec and new AFO    Baseline  1.8    Time  4    Period  Weeks    Status  Partially Met    Target Date  07/31/19        PT Long Term Goals - 08/30/19 1327      PT LONG TERM GOAL #1   Title  Pt will be independent with final HEP    Baseline  Due to COVID - has not returned to gym    Time  8    Period  Weeks    Status  Revised    Target Date  09/02/19      PT LONG TERM GOAL #3   Title  Pt will improve five time sit to stand to </= 18 seconds with use of UE and supervision    Time  8    Period  Weeks    Status  Revised    Target Date  09/02/19      PT LONG TERM GOAL #4   Title  Pt will improve BERG to >/= 42/56    Time  8    Period  Weeks    Status  Revised    Target Date  09/02/19      PT LONG TERM GOAL #5   Title  Pt will improve gait velocity with cane and new AFO to >/= 2.5 ft/sec    Time  8    Period  Weeks    Status  Revised    Target Date  09/02/19            Plan - 08/30/19 1323    Clinical Impression Statement  Due to patient receiving new theraband therapy session reviewed UE, core and LE exercises to perform at home with new theraband in order to continue full body strength, conditioning and balance training in standing.  Pt required intermittent guarding and min-mod A from son to perform safely - will continue to discuss safest way for pt to perform on his own when son is not present.  Final session will focus on condensing and finalizing HEP and reassessment  of balance measures.    Rehab Potential  Good    PT Frequency  1x / week    PT Duration  8 weeks    PT Treatment/Interventions  ADLs/Self Care Home Management;Aquatic Therapy;Electrical Stimulation;DME Instruction;Gait  training;Stair training;Functional mobility training;Orthotic Fit/Training;Patient/family education;Neuromuscular re-education;Balance training;Therapeutic exercise;Therapeutic activities    PT Next Visit Plan  re check some of BERG, five time sit to stand from chair.  Finalize and condense HEP.  D/C    PT Home Exercise Plan  [email or web address]     Consulted and Agree with Plan of Care  Patient;Family member/caregiver    Family Member Consulted  son       Patient will benefit from skilled therapeutic intervention in order to improve the following deficits and impairments:  Abnormal gait, Decreased balance, Decreased strength, Difficulty walking, Impaired sensation  Visit Diagnosis: Hemiplegia and hemiparesis following cerebral infarction affecting right dominant side (HCC)  Other abnormalities of gait and mobility  Muscle weakness (generalized)  Unsteadiness on feet  Repeated falls  Foot drop, left foot  Other disturbances of skin sensation     Problem List Patient Active Problem List   Diagnosis Date Noted  . Diabetes mellitus without complication ([place]) 87/27/6184  . Gait disturbance, post-stroke   . Slow transit constipation   . Small vessel disease ([place]) 10/19/2018  . Benign essential HTN   . Stage 2 chronic kidney disease   . Sequela of lacunar infarction   . CVA (cerebral vascular accident) ([place]) 10/17/2018  . Burning sensation of the foot 04/12/2015  . Leg weakness 01/08/2015  . Carpal tunnel syndrome 12/26/2014  . Dropfoot 12/26/2014  . Neuralgia neuritis, sciatic nerve 12/26/2014  . Diabetes mellitus ([place]) 08/24/2014  . BP (high blood pressure) 08/24/2014  . HLD (hyperlipidemia) 08/24/2014  . H/O coronary artery bypass surgery 08/24/2014    Rico Junker, PT, DPT 08/30/19    1:55 PM   [facility] [address] [place], [place], [address] Phone: [phone]   Fax:  [phone]  Name: Zachary Spence MRN: [id number] Date of Birth: Oct 02, 1923

## 2019-08-30 NOTE — Patient Instructions (Signed)
I had a long d/w patient and his lady friend about his lacunar  stroke, risk for recurrent stroke/TIAs, personally independently reviewed imaging studies and stroke evaluation results and answered questions.Continue aspirin 81 mg daily  for secondary stroke prevention  and maintain strict control of hypertension with blood pressure goal below 130/90, diabetes with hemoglobin A1c goal below 6.5% and lipids with LDL cholesterol goal below 70 mg/dL. I also advised the patient to eat a healthy diet with plenty of whole grains, cereals, fruits and vegetables, exercise regularly and maintain ideal body weight . Check f/u carotid dopplers.Followup in the future with my nurse practitioner Janett Billow  in  6 months or call earlier if necessary

## 2019-08-30 NOTE — Patient Instructions (Signed)
Access Code: [email or web address]  URL: [email or web address]  Date: 08/30/2019  Prepared by: Misty Stanley   Exercises Standing Anterior Posterior Weight Shift - 10 reps - 2 sets - 2x daily - 7x weekly Wide Stance with Eyes Closed - 2 sets - 10 second hold - 2x daily - 7x weekly Romberg Stance with Head Nods - 10 reps - 2 sets - 2x daily - 7x weekly Romberg Stance with Head Rotation - 10 reps - 2 sets - 2x daily - 7x weekly Sit to Stand with Arm Reach Toward Target - 10 reps - 1 sets - 2x daily - 7x weekly Standing Shoulder Horizontal Abduction with Resistance - 10 reps - 1 sets - 2x daily - 7x weekly Standing Shoulder Single Arm PNF D2 Flexion with Resistance - 10 reps - 2 sets - 2x daily - 7x weekly Standing Elbow Flexion with Resistance - 10 reps - 2x daily - 7x weekly Standing Elbow Extension with Anchored Resistance - 10 reps - 2x daily - 7x weekly Standing Row with Anchored Resistance - 10 reps - 2x daily - 7x weekly Chest Press with Anchored Resistance - 10 reps - 2x daily - 7x weekly Standing Single Arm Shoulder Flexion with Posterior Anchored Resistance - 10 reps - 2 sets - 2x daily - 7x weekly Standing Shoulder Abduction with Resistance at Hip - 10 reps - 2 sets - 2x daily - 7x weekly Deadlift with Resistance - 10 reps - 2x daily - 7x weekly Standing Trunk Rotation with Resistance - 10 reps - 2 sets - 2x daily - 7x weekly Standing Quarter Turn with Counter Support - 10 reps - 1 sets - 2x daily - 7x weekly Side Stepping with Counter Support - 10 reps - 1 sets - 2x daily - 7x weekly

## 2019-08-30 NOTE — Progress Notes (Signed)
[facility] [address] [place]. [place] [address] [phone]       OFFICE FOLLOW-UP NOTE  [name] Date of Birth:  [birth year] Medical Record Number:  [id number]   HPI: Office visit 12/07/2018: Zachary Spence is a pleasant 85 year old Caucasian male seen today for initial office visit following hospital admission for stroke in October 2019.  He is accompanied by his son and history is obtained from them and review of electronic medical records.  I have personally reviewed imaging films in PACS.  He is a 85 year old male with past medical history of hypertension, non-Hodgkin's lymphoma who presented with sudden onset of gait difficulties and being wobbly on his feet and dizzy.  He does have baseline left foot drop and uses an AFO to walk and uses normally a cane.  The son took him to urgent care where they referred him to [facility] for evaluation.  He presented beyond time window for TPA.  CT scan of the brain was unremarkable but MRI scan of the brain showed a small left corona radiata lacunar infarct.  CT angiogram of the head and neck showed no large vessel stenosis or occlusion. but showed a small parotid nodule which had slightly increased in size compared to 2016.  LDL cholesterol was elevated at 132 mg percent.  Hemoglobin A1c was 6.5.  Transthoracic echo showed normal ejection fraction without cardiac source of embolism.  Patient was seen by physical occupational therapy and transferred to inpatient rehab.  He was placed on dual antiplatelet therapy of aspirin and Plavix.  He has done well in rehab and is currently at home living with his son.  He started home physical and occupational therapy few weeks ago.  He is able to walk with a walker with left foot drop and ankle foot brace.  He is tolerating aspirin and Plavix with minor bruising.  In fact he scraped his forearm this morning and wait to my office and has abrasion on his left elbow.  He states his sugars are  well controlled.  Is tolerating Crestor without muscle aches or pains.  He has not had any follow-up lipid profile checked. Virtual video visit 03/14/2019 :Zachary Spence was seen today for a virtual telephonic visit.  He has history of remote lacunar stroke in October 2019 from which she has recovered well.  He states he has had no recurrent stroke or TIA symptoms.  He continues to have minimum right-sided weakness.  He has mild numbness in his hands from carpal tunnel.  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  Is working with physical therapist to get new foot brace.  He recently saw his primary care physician 03/10/2019 and had lab work done and lipid profile and hemoglobin A1c was satisfactory.  Hemoglobin A1c was 6.8.  Total cholesterol 172, triglyceride 205.  He is tolerating aspirin well without side effects.  He is also on glipizide for diabetes and Crestor 5 mg for his lipids.  He has no neurological complaints. Update 08/30/2019 : He returns for follow-up after last virtual video visit on 03/14/2019.  He is accompanied by a lady friend as his son could not make it today.  He continues to do well and has not had recurrent stroke or TIA symptoms since his stroke in October 2019.  He remains on aspirin which is tolerating well without bruising or bleeding.  His blood pressure remains well controlled and today it is 1 one 7/69.  Patient is currently  doing outpatient physical therapy via video visits.  He continues to walk with a cane and now has a new AFO for his left foot drop and states is done well he has had no falls or injuries.  He states his control of his sugar is also good and last hemoglobin A1c was 6.7.  He had some lab work done 2 weeks ago by his primary physician for cholesterol check but I do not have those results.  Patient continues to have tingling and numbness in his hands from his carpal tunnel but states that it is stable and not bothersome.  He has not had any follow-up  carotid ultrasound checked since his admission for stroke last year.  He has no new complaints today. ROS:   14 system review of systems is positive for ringing in the ears, decreased hearing, weakness, left foot drop, murmur and all other systems negative PMH:  Past Medical History:  Diagnosis Date   Bowel obstruction ([place])    Diabetes mellitus without complication ([place])    Hypertension    Non Hodgkin's lymphoma ([place])    Stroke [facility])     Social History:  Social History   Socioeconomic History   Marital status: Widowed    Spouse name: Not on file   Number of children: Not on file   Years of education: Not on file   Highest education level: Not on file  Occupational History   Not on file  Social Needs   Financial resource strain: Not on file   Food insecurity    Worry: Not on file    Inability: Not on file   Transportation needs    Medical: Not on file    Non-medical: Not on file  Tobacco Use   Smoking status: Former Smoker   Smokeless tobacco: Never Used  Substance and Sexual Activity   Alcohol use: No    Alcohol/week: 0.0 standard drinks   Drug use: Never   Sexual activity: Not on file  Lifestyle   Physical activity    Days per week: Not on file    Minutes per session: Not on file   Stress: Not on file  Relationships   Social connections    Talks on phone: Not on file    Gets together: Not on file    Attends religious service: Not on file    Active member of club or organization: Not on file    Attends meetings of clubs or organizations: Not on file    Relationship status: Not on file   Intimate partner violence    Fear of current or ex partner: Not on file    Emotionally abused: Not on file    Physically abused: Not on file    Forced sexual activity: Not on file  Other Topics Concern   Not on file  Social History Narrative   Not on file    Medications:   Current Outpatient Medications on File Prior to Visit  Medication Sig  Dispense Refill   acetaminophen (TYLENOL) 325 MG tablet Take 2 tablets (650 mg total) by mouth every 4 (four) hours as needed for mild pain (or temp > 37.5 C (99.5 F)).     ASPIRIN 81 PO Take by mouth.     calcium carbonate (OS-CAL - DOSED IN MG OF ELEMENTAL CALCIUM) 1250 (500 Ca) MG tablet Take 1 tablet (1,250 mg total) by mouth daily with breakfast. 30 tablet 1   folic acid (FOLVITE) 1 MG tablet Take 1 tablet (  1 mg total) by mouth daily. 30 tablet 1   gentamicin cream (GARAMYCIN) 0.1 % Apply 1 application topically 2 (two) times daily. 15 g 1   glipiZIDE (GLUCOTROL) 5 MG tablet Take 1 tablet (5 mg total) by mouth daily. 30 tablet 1   metoprolol succinate (TOPROL XL) 25 MG 24 hr tablet Take 0.5 tablets (12.5 mg total) by mouth daily. 45 tablet 3   Multiple Vitamin (MULTIVITAMIN WITH MINERALS) TABS tablet Take 1 tablet by mouth daily.     Multiple Vitamins-Minerals (PRESERVISION AREDS 2 PO) Take 1 capsule by mouth daily.      Omega-3 Fatty Acids (FISH OIL) 1000 MG CAPS Take 1,000 mg by mouth daily.      rosuvastatin (CRESTOR) 5 MG tablet Take 1 tablet (5 mg total) by mouth daily at 6 PM. 30 tablet 0   No current facility-administered medications on file prior to visit.     Allergies:   Allergies  Allergen Reactions   Erythromycin Swelling    Swelling of hands    Physical Exam General: well developed, well nourished frail elderly Caucasian male patient, seated, in no evident distress Head: head normocephalic and atraumatic.  Neck: supple with no carotid or supraclavicular bruits Cardiovascular: regular rate and rhythm, ejection systolic murmur heard all over the precordium. Musculoskeletal: no deformity Skin:  no rash/petichiae.  Small abrasion over the left elbow. Vascular:  Normal pulses all extremities Vitals:   08/30/19 1046  BP: 117/69  Pulse: 72  Temp: 97.7 F (36.5 C)   Neurologic Exam Mental Status: Awake and fully alert. Oriented to place and time. Recent  and remote memory slightly diminished. Attention span, concentration and fund of knowledge appropriate. Mood and affect appropriate.  Cranial Nerves: Fundoscopic exam not done.  Pupils equal, briskly reactive to light. Extraocular movements full without nystagmus. Visual fields full to confrontation. Hearing diminished bilaterally. Facial sensation intact. Face, tongue, palate moves normally and symmetrically.  Motor: Normal bulk and tone. Normal strength in all tested extremity muscles except left foot drop which is chronic in mild bilateral ankle dorsiflexor weakness left greater than right.  Diminished fine finger movements on the left.  Minimal weakness of left grip.  Orbits right over left upper extremity.. He has left foot drop Sensory.: intact to touch ,pinprick .position and vibratory sensation.  Coordination: Rapid alternating movements normal in all extremities. Finger-to-nose and heel-to-shin performed accurately bilaterally. Gait and Station: Arises from chair without difficulty. Stance is normal.  He uses a walker and has a left foot brace and drags left foot. Reflexes: 1+ and symmetric. Toes downgoing.   NIHSS  0 Modified Rankin  2  ASSESSMENT: 85 year old Caucasian male with left brain subcortical lacunar infarct in October 2019 from which she is doing very well with excellent recovery.  Vascular risk factors of diabetes, hyperlipidemia and age.  History of chronic bilateral carpal tunnel syndrome as well as left foot drop which are all stable as well.     PLAN: I had a long d/w patient and his lady friend about his lacunar  stroke, risk for recurrent stroke/TIAs, personally independently reviewed imaging studies and stroke evaluation results and answered questions.Continue aspirin 81 mg daily  for secondary stroke prevention  and maintain strict control of hypertension with blood pressure goal below 130/90, diabetes with hemoglobin A1c goal below 6.5% and lipids with LDL cholesterol  goal below 70 mg/dL. I also advised the patient to eat a healthy diet with plenty of whole grains, cereals, fruits and vegetables, exercise  regularly and maintain ideal body weight . Check f/u carotid dopplers.he was encouraged to use his cane and left ankle foot brace at all times for walking for fall and safety and.  Followup in the future with my nurse practitioner Janett Billow  in  6 months or call earlier if necessary Greater than 50% of time during this 25 minute visit was spent on counseling,explanation of diagnosis, planning of further management, discussion with patient and family and coordination of care Zachary Contras, MD  [facility] [address] [place], [place] [address]  Phone [phone] Fax [phone] Note: This document was prepared with digital dictation and possible smart phrase technology. Any transcriptional errors that result from this process are unintentional

## 2019-09-02 ENCOUNTER — Other Ambulatory Visit: Payer: Self-pay

## 2019-09-02 ENCOUNTER — Ambulatory Visit: Payer: Medicare Other | Admitting: Physical Therapy

## 2019-09-02 DIAGNOSIS — M6281 Muscle weakness (generalized): Secondary | ICD-10-CM

## 2019-09-02 DIAGNOSIS — R2681 Unsteadiness on feet: Secondary | ICD-10-CM

## 2019-09-02 DIAGNOSIS — I69351 Hemiplegia and hemiparesis following cerebral infarction affecting right dominant side: Secondary | ICD-10-CM | POA: Diagnosis not present

## 2019-09-02 DIAGNOSIS — R2689 Other abnormalities of gait and mobility: Secondary | ICD-10-CM

## 2019-09-02 DIAGNOSIS — R208 Other disturbances of skin sensation: Secondary | ICD-10-CM

## 2019-09-02 DIAGNOSIS — R296 Repeated falls: Secondary | ICD-10-CM

## 2019-09-02 DIAGNOSIS — M21372 Foot drop, left foot: Secondary | ICD-10-CM

## 2019-09-02 NOTE — Patient Instructions (Signed)
Access Code: [email or web address]  URL: [email or web address]  Date: 08/30/2019  Prepared by: Misty Stanley   Exercises Standing Anterior Posterior Weight Shift - 10 reps - 2 sets - 2x daily - 7x weekly Wide Stance with Eyes Closed - 2 sets - 10 second hold - 2x daily - 7x weekly Romberg Stance with Head Nods - 10 reps - 2 sets - 2x daily - 7x weekly Romberg Stance with Head Rotation - 10 reps - 2 sets - 2x daily - 7x weekly Sit to Stand with Arm Reach Toward Target - 10 reps - 1 sets - 2x daily - 7x weekly Standing Shoulder Horizontal Abduction with Resistance - 10 reps - 1 sets - 2x daily - 7x weekly Standing Shoulder Single Arm PNF D2 Flexion with Resistance - 10 reps - 2 sets - 2x daily - 7x weekly Standing Elbow Flexion with Resistance - 10 reps - 2x daily - 7x weekly Standing Elbow Extension with Anchored Resistance - 10 reps - 2x daily - 7x weekly Standing Row with Anchored Resistance - 10 reps - 2x daily - 7x weekly Chest Press with Anchored Resistance - 10 reps - 2x daily - 7x weekly Standing Single Arm Shoulder Flexion with Posterior Anchored Resistance - 10 reps - 2 sets - 2x daily - 7x weekly Standing Shoulder Abduction with Resistance at Hip - 10 reps - 2 sets - 2x daily - 7x weekly Deadlift with Resistance - 10 reps - 2x daily - 7x weekly Standing Quarter Turn with Counter Support - 10 reps - 1 sets - 2x daily - 7x weekly Side Stepping with Counter Support - 10 reps - 1 sets - 2x daily - 7x weekly

## 2019-09-02 NOTE — Therapy (Signed)
[facility] [address] [name], [place], [address] Phone: [phone]   Fax:  [phone]  Physical Therapy Treatment and D/C Summary  Patient Details  Name: Zachary Spence MRN: [id number] Date of Birth: 04-09-1923 Referring Provider (PT): Charlett Blake, MD   Encounter Date: 09/02/2019  I connected with  Zachary Spence on 09/02/19 by a video enabled telehealth application and verified that I am speaking with the correct person using two identifiers.   I discussed the limitations of evaluation and management by telehealth. The patient expressed understanding and agreed to proceed.   PT End of Session - 09/02/19 1711    Visit Number  24    Number of Visits  24    Date for PT Re-Evaluation  09/02/19    Authorization Type  Medicare and [employer] -covered 100%, 10th visit PN    PT Start Time  1616    PT Stop Time  1650    PT Time Calculation (min)  34 min    Activity Tolerance  Patient tolerated treatment well    Behavior During Therapy  WFL for tasks assessed/performed       Past Medical History:  Diagnosis Date  . Bowel obstruction ([place])   . Diabetes mellitus without complication ([place])   . Hypertension   . Non Hodgkin's lymphoma ([place])   . Stroke [facility])     Past Surgical History:  Procedure Laterality Date  . APPENDECTOMY    . CORONARY ARTERY BYPASS GRAFT    . SPLENECTOMY      There were no vitals filed for this visit.      [facility] PT Assessment - 09/02/19 1622      Standardized Balance Assessment   Standardized Balance Assessment  Berg Balance Test;Five Times Sit to Stand;10 meter walk test    Five times sit to stand comments   25 seconds without use of UE; limited due to back pain today.    10 Meter Walk  no change in gait velocity; difficulty getting accurate time via telehealth      Berg Balance Test   Sit to Stand  Able to stand without using hands and stabilize independently    Standing  Unsupported  Able to stand safely 2 minutes    Sitting with Back Unsupported but Feet Supported on Floor or Stool  Able to sit safely and securely 2 minutes    Stand to Sit  Sits safely with minimal use of hands    Transfers  Able to transfer safely, minor use of hands    Standing Unsupported with Eyes Closed  Able to stand 3 seconds    Standing Unsupported with Feet Together  Able to place feet together independently and stand 1 minute safely    From Standing, Reach Forward with Outstretched Arm  Can reach confidently >25 cm (10")    From Standing Position, Pick up Object from Floor  Able to pick up shoe safely and easily    From Standing Position, Turn to Look Behind Over each Shoulder  Looks behind one side only/other side shows less weight shift    Turn 360 Degrees  Able to turn 360 degrees safely but slowly    Standing Unsupported, Alternately Place Feet on Step/Stool  Able to complete >2 steps/needs minimal assist    Standing Unsupported, One Foot in Front  Able to take small step independently and hold 30 seconds    Standing on One Leg  Tries to lift leg/unable  to hold 3 seconds but remains standing independently    Total Score  43    Berg comment:  43/56                           PT Education - 09/02/19 1709    Education Details  purpose of performing exercises with eyes closed, goals met, D/C today and indications to return to therapy; remove trunk rotations from HEP    Person(s) Educated  Patient;Child(ren)    Methods  Explanation    Comprehension  Verbalized understanding      Access Code: [email or web address]  URL: [email or web address]  Date: 08/30/2019  Prepared by: Misty Stanley   Exercises Standing Anterior Posterior Weight Shift - 10 reps - 2 sets - 2x daily - 7x weekly Wide Stance with Eyes Closed - 2 sets - 10 second hold - 2x daily - 7x weekly Romberg Stance with Head Nods - 10 reps - 2 sets - 2x daily - 7x weekly Romberg Stance with Head  Rotation - 10 reps - 2 sets - 2x daily - 7x weekly Sit to Stand with Arm Reach Toward Target - 10 reps - 1 sets - 2x daily - 7x weekly Standing Shoulder Horizontal Abduction with Resistance - 10 reps - 1 sets - 2x daily - 7x weekly Standing Shoulder Single Arm PNF D2 Flexion with Resistance - 10 reps - 2 sets - 2x daily - 7x weekly Standing Elbow Flexion with Resistance - 10 reps - 2x daily - 7x weekly Standing Elbow Extension with Anchored Resistance - 10 reps - 2x daily - 7x weekly Standing Row with Anchored Resistance - 10 reps - 2x daily - 7x weekly Chest Press with Anchored Resistance - 10 reps - 2x daily - 7x weekly Standing Single Arm Shoulder Flexion with Posterior Anchored Resistance - 10 reps - 2 sets - 2x daily - 7x weekly Standing Shoulder Abduction with Resistance at Hip - 10 reps - 2 sets - 2x daily - 7x weekly Deadlift with Resistance - 10 reps - 2x daily - 7x weekly Standing Quarter Turn with Counter Support - 10 reps - 1 sets - 2x daily - 7x weekly Side Stepping with Counter Support - 10 reps - 1 sets - 2x daily - 7x weekly   PT Short Term Goals - 08/12/19 1726      PT SHORT TERM GOAL #1   Title  Pt will demonstrate ability to safely perform HEP with supervision of son    Time  4    Period  Weeks    Status  Achieved    Target Date  07/31/19      PT SHORT TERM GOAL #3   Title  Pt will improve five time sit to stand from chair to </= 18 seconds with use of UE and supervision from son    Baseline  24 seconds WITHOUT use of UE    Time  4    Period  Weeks    Status  Partially Met    Target Date  07/31/19      PT SHORT TERM GOAL #4   Title  Pt will improve BERG to >/= 39/56    Baseline  39/56    Time  4    Period  Weeks    Status  Achieved    Target Date  07/31/19      PT SHORT TERM GOAL #5   Title  Pt will improve gait velocity  with cane to >/= 2.0 ft/sec and new AFO    Baseline  1.8    Time  4    Period  Weeks    Status  Partially Met    Target Date   07/31/19        PT Long Term Goals - 09/02/19 1639      PT LONG TERM GOAL #1   Title  Pt will be independent with final HEP    Time  8    Period  Weeks    Status  Achieved      PT LONG TERM GOAL #3   Title  Pt will improve five time sit to stand to </= 18 seconds with use of UE and supervision    Baseline  24 seconds without UE support    Time  8    Period  Weeks    Status  Partially Met      PT LONG TERM GOAL #4   Title  Pt will improve BERG to >/= 42/56    Time  8    Period  Weeks    Status  Achieved      PT LONG TERM GOAL #5   Title  Pt will improve gait velocity with cane and new AFO to >/= 2.5 ft/sec    Baseline  1.8 ft/sec but difficult to assess via telehealth    Time  8    Period  Weeks    Status  Not Met            Plan - 09/02/19 1711    Clinical Impression Statement  Treatment session focused on assessment of progress towards goals. Pt has made good progress and has met 2/4 LTG, partially meeting other two. Pt's static and dynamic balance have improved and pt has decreased falls risk.  Gait velocity goal was difficult to assess via telehealth and therefore unable to determine true progress.  Five time sit to stand time did not decrease but pt did progress to performing without UE support.  Pt is fully independent with HEP and performs 2x/day.  Pt is ready for D/C.    Rehab Potential  Good    PT Frequency  1x / week    PT Duration  8 weeks    PT Treatment/Interventions  ADLs/Self Care Home Management;Aquatic Therapy;Electrical Stimulation;DME Instruction;Gait training;Stair training;Functional mobility training;Orthotic Fit/Training;Patient/family education;Neuromuscular re-education;Balance training;Therapeutic exercise;Therapeutic activities    PT Next Visit Plan  --    PT Home Exercise Plan  [email or web address]     Consulted and Agree with Plan of Care  Patient;Family member/caregiver    Family Member Consulted  son       Patient will benefit from skilled  therapeutic intervention in order to improve the following deficits and impairments:  Abnormal gait, Decreased balance, Decreased strength, Difficulty walking, Impaired sensation  Visit Diagnosis: Hemiplegia and hemiparesis following cerebral infarction affecting right dominant side (HCC)  Other abnormalities of gait and mobility  Muscle weakness (generalized)  Unsteadiness on feet  Repeated falls  Foot drop, left foot  Other disturbances of skin sensation     Problem List Patient Active Problem List   Diagnosis Date Noted  . Diabetes mellitus without complication ([place]) 97/67/3419  . Gait disturbance, post-stroke   . Slow transit constipation   . Small vessel disease ([place]) 10/19/2018  . Benign essential HTN   . Stage 2 chronic kidney disease   . Sequela of lacunar infarction   . CVA (cerebral vascular accident) ([place]) 10/17/2018  .  Burning sensation of the foot 04/12/2015  . Leg weakness 01/08/2015  . Carpal tunnel syndrome 12/26/2014  . Dropfoot 12/26/2014  . Neuralgia neuritis, sciatic nerve 12/26/2014  . Diabetes mellitus ([place]) 08/24/2014  . BP (high blood pressure) 08/24/2014  . HLD (hyperlipidemia) 08/24/2014  . H/O coronary artery bypass surgery 08/24/2014    PHYSICAL THERAPY DISCHARGE SUMMARY  Visits from Start of Care: 24  Current functional level related to goals / functional outcomes: See LTG achievement and impression statement above   Remaining deficits: Balance impairments due to impaired sensation   Education / Equipment: HEP, theraband  Plan: Patient agrees to discharge.  Patient goals were partially met. Patient is being discharged due to being pleased with the current functional level.  ?????     Rico Junker, PT, DPT 09/02/19    5:17 PM    [facility] [address] [place], [place], [address] Phone: [phone]   Fax:  [phone]  Name: JAZPER NIKOLAI MRN:  [id number] Date of Birth: [birth year]

## 2019-09-06 ENCOUNTER — Ambulatory Visit (HOSPITAL_COMMUNITY)
Admission: RE | Admit: 2019-09-06 | Discharge: 2019-09-06 | Disposition: A | Discharge status: Home / Self Care | Payer: Medicare Other | Source: Ambulatory Visit | Attending: Neurology | Admitting: Neurology

## 2019-09-06 ENCOUNTER — Other Ambulatory Visit: Payer: Self-pay

## 2019-09-06 DIAGNOSIS — I6529 Occlusion and stenosis of unspecified carotid artery: Secondary | ICD-10-CM | POA: Diagnosis not present

## 2019-09-06 NOTE — Progress Notes (Signed)
Carotid artery duplex has been completed. Preliminary results can be found in CV Proc through chart review.   09/06/19 1:38 PM Carlos Levering RVT

## 2019-09-15 ENCOUNTER — Telehealth: Payer: Self-pay

## 2019-09-15 NOTE — Telephone Encounter (Signed)
-----   Message from Garvin Fila, MD sent at 09/15/2019  8:48 AM EDT ----- Zachary Spence inform the patient that carotid ultrasound study was normal.

## 2019-09-15 NOTE — Telephone Encounter (Signed)
I called pt that his carotid ultrasound study was normal. PT verbalized understanding.  ------

## 2019-11-22 ENCOUNTER — Ambulatory Visit (INDEPENDENT_AMBULATORY_CARE_PROVIDER_SITE_OTHER): Payer: Medicare Other | Admitting: Podiatry

## 2019-11-22 ENCOUNTER — Other Ambulatory Visit: Payer: Self-pay

## 2019-11-22 ENCOUNTER — Encounter: Payer: Self-pay | Admitting: Podiatry

## 2019-11-22 DIAGNOSIS — B351 Tinea unguium: Secondary | ICD-10-CM | POA: Diagnosis not present

## 2019-11-22 DIAGNOSIS — M79676 Pain in unspecified toe(s): Secondary | ICD-10-CM

## 2019-11-22 NOTE — Progress Notes (Signed)
Complaint:  Visit Type: Patient returns to my office for continued preventative foot care services. Complaint: Patient states" my nails have grown long and thick and become painful to walk and wear shoes" Patient has been diagnosed with DM with no foot complications. The patient presents for preventative foot care services. No changes to ROS.  Patient has self avulsed both great toenails.  Podiatric Exam: Vascular: dorsalis pedis and posterior tibial pulses are weakly  palpable bilateral. Capillary return is immediate. Cold feet noted.. Skin turgor WNL  Sensorium: Normal Semmes Weinstein monofilament test. Normal tactile sensation bilaterally. Nail Exam: Pt has thick disfigured discolored nails with subungual debris noted bilateral entire nail second  through fifth toenails Ulcer Exam: There is no evidence of ulcer or pre-ulcerative changes or infection. Orthopedic Exam: Muscle tone and strength are WNL. No limitations in general ROM. No crepitus or effusions noted. Foot type and digits show no abnormalities. Bony prominences are unremarkable. Skin: No Porokeratosis. No infection or ulcers  Diagnosis:  Onychomycosis, , Pain in right toe, pain in left toes  Treatment & Plan Procedures and Treatment: Consent by patient was obtained for treatment procedures.   Debridement of mycotic and hypertrophic toenails, 1 through 5 bilateral and clearing of subungual debris. No ulceration, no infection noted.  Return Visit-Office Procedure: Patient instructed to return to the office for a follow up visit 3 months for continued evaluation and treatment.    Gardiner Barefoot DPM

## 2020-01-09 ENCOUNTER — Telehealth: Payer: Self-pay

## 2020-01-09 NOTE — Telephone Encounter (Signed)
Patients son called needing help scheduling his fathers 1st COVID-19 immunization. Patient was scheduled.

## 2020-01-12 ENCOUNTER — Other Ambulatory Visit: Payer: Medicare Other

## 2020-01-26 DIAGNOSIS — R109 Unspecified abdominal pain: Secondary | ICD-10-CM | POA: Diagnosis not present

## 2020-01-26 DIAGNOSIS — E1165 Type 2 diabetes mellitus with hyperglycemia: Secondary | ICD-10-CM | POA: Diagnosis not present

## 2020-01-26 DIAGNOSIS — K5733 Diverticulitis of large intestine without perforation or abscess with bleeding: Secondary | ICD-10-CM | POA: Diagnosis not present

## 2020-01-26 DIAGNOSIS — I2510 Atherosclerotic heart disease of native coronary artery without angina pectoris: Secondary | ICD-10-CM | POA: Diagnosis not present

## 2020-01-26 DIAGNOSIS — K5790 Diverticulosis of intestine, part unspecified, without perforation or abscess without bleeding: Secondary | ICD-10-CM | POA: Diagnosis not present

## 2020-02-06 ENCOUNTER — Ambulatory Visit (HOSPITAL_COMMUNITY): Payer: Medicare Other | Attending: Cardiology

## 2020-02-06 ENCOUNTER — Other Ambulatory Visit: Payer: Self-pay

## 2020-02-06 DIAGNOSIS — I2510 Atherosclerotic heart disease of native coronary artery without angina pectoris: Secondary | ICD-10-CM | POA: Diagnosis not present

## 2020-02-13 DIAGNOSIS — L821 Other seborrheic keratosis: Secondary | ICD-10-CM | POA: Diagnosis not present

## 2020-02-13 DIAGNOSIS — L570 Actinic keratosis: Secondary | ICD-10-CM | POA: Diagnosis not present

## 2020-02-13 DIAGNOSIS — L720 Epidermal cyst: Secondary | ICD-10-CM | POA: Diagnosis not present

## 2020-02-13 DIAGNOSIS — L820 Inflamed seborrheic keratosis: Secondary | ICD-10-CM | POA: Diagnosis not present

## 2020-02-13 DIAGNOSIS — D485 Neoplasm of uncertain behavior of skin: Secondary | ICD-10-CM | POA: Diagnosis not present

## 2020-02-14 ENCOUNTER — Encounter: Payer: Self-pay | Admitting: Cardiovascular Disease

## 2020-02-14 ENCOUNTER — Ambulatory Visit (INDEPENDENT_AMBULATORY_CARE_PROVIDER_SITE_OTHER): Payer: Medicare Other | Admitting: Cardiovascular Disease

## 2020-02-14 ENCOUNTER — Other Ambulatory Visit: Payer: Self-pay

## 2020-02-14 VITALS — BP 122/73 | HR 88 | Temp 97.9°F | Ht 71.0 in | Wt 186.2 lb

## 2020-02-14 DIAGNOSIS — E118 Type 2 diabetes mellitus with unspecified complications: Secondary | ICD-10-CM | POA: Diagnosis not present

## 2020-02-14 DIAGNOSIS — E785 Hyperlipidemia, unspecified: Secondary | ICD-10-CM | POA: Diagnosis not present

## 2020-02-14 DIAGNOSIS — I2510 Atherosclerotic heart disease of native coronary artery without angina pectoris: Secondary | ICD-10-CM | POA: Diagnosis not present

## 2020-02-14 DIAGNOSIS — Z951 Presence of aortocoronary bypass graft: Secondary | ICD-10-CM | POA: Diagnosis not present

## 2020-02-14 DIAGNOSIS — I350 Nonrheumatic aortic (valve) stenosis: Secondary | ICD-10-CM | POA: Diagnosis not present

## 2020-02-14 DIAGNOSIS — I6381 Other cerebral infarction due to occlusion or stenosis of small artery: Secondary | ICD-10-CM

## 2020-02-14 NOTE — Progress Notes (Signed)
Cardiology Office Note    Date:  02/15/2020   ID:  Zachary Spence, DOB Jul 16, 1923, MRN [id number]  PCP:  Zachary Seashore, MD  Cardiologist:  Zachary Majestic, MD   F/U cardiology evaluation  History of Present Illness:  Zachary Spence is a 86 y.o. male who established cardiology care with me in August 2020.  He presents for 66-monthfollow-up evaluation.  Zachary Spence has known coronary artery disease and underwent CABG revascularization surgery x5 in 103/04/99  [facility].  I do not have any of the records [facility].  Zachary Spence has a history of type 2 diabetes mellitus as well as hyperlipidemia.  He suffered a lacunar stroke in October 2019 and an MRI at that time showed a small focus of acute ischemia within the posterior left corona radiata.  He also had findings of chronic small vessel ischemia including multiple old lacunar infarcts.  He is recovering well with minimal weakness on the right side.  He has had some mild neuropathy.  He has left foot drop and continues to wear a foot brace.  [place]   He had undergone an echo Doppler study in October 2019 which showed an EF of 55 to 60% and there was evidence for mild left ventricular hypertrophy.  His aortic valve was thickened and there was evidence for mild aortic stenosis with a mean gradient of 13 and peak gradient 24.  There was evidence for mitral annular calcification.  He has [facility]  When I initially saw him he denied  any episodes of chest pain or tightness, presyncope or syncope.  He was unaware of any significant dyspnea.    During his initial evaluation with me, physical examination disclosed a 2/6 systolic murmur compatible with his previously documented aortic valve stenosis.  I recommended a follow-up echo Doppler evaluation which was completed on  February 06, 2020.  This revealed EF 55 to 60% with mild LVH and grade 1 diastolic dysfunction.  His aortic valve was tricuspid.  There was mild aortic insufficiency.  It was suspected most likely that he had moderate aortic stenosis although his mean gradient was only 15 mm with a peak gradient of 28.7.  Aortic valve area by VTI was 1.25 cm.  Over the past 6 months, Mr. SVanderveldenhas felt well.  [place]he is now living with his son Zachary Spence  He denies any chest pain or anginal symptoms.  He uses a cane to walk outside and a walker to walk at his home.  He does have left foot drop for which he uses a left foot brace.  He continues to be on Toprol-XL 12.5 mg daily.  He is on rosuvastatin 5 mg.  He is diabetic on glipizide 5 mg.  He presents for reevaluation.  Past Medical History:  Diagnosis Date  . Bowel obstruction ([place]   . Diabetes mellitus without complication ([facility]   . Hypertension   . Non Hodgkin's lymphoma ([facility]   . Stroke ([facility]     Past Surgical History:  Procedure Laterality Date  . APPENDECTOMY    . CORONARY ARTERY BYPASS GRAFT    . SPLENECTOMY      Current Medications: Outpatient Medications Prior to Visit  Medication Sig Dispense Refill  . acetaminophen (  TYLENOL) 325 MG tablet Take 2 tablets (650 mg total) by mouth every 4 (four) hours as needed for mild pain (or temp > 37.5 C (99.5 F)).    . ASPIRIN 81 PO Take by mouth.    . calcium carbonate (OS-CAL - DOSED IN MG OF ELEMENTAL CALCIUM) 1250 (500 Ca) MG tablet Take 1 tablet (1,250 mg total) by mouth daily with breakfast. 30 tablet 1  . folic acid (FOLVITE) 1 MG tablet Take 1 tablet (1 mg total) by mouth daily. 30 tablet 1  . gentamicin cream (GARAMYCIN) 0.1 % Apply 1 application topically 2 (two) times daily. 15 g 1  . glipiZIDE (GLUCOTROL) 5 MG tablet Take 1 tablet (5 mg total) by mouth daily. 30 tablet 1  . metoprolol succinate (TOPROL XL) 25 MG 24 hr tablet Take  0.5 tablets (12.5 mg total) by mouth daily. 45 tablet 3  . Multiple Vitamin (MULTIVITAMIN WITH MINERALS) TABS tablet Take 1 tablet by mouth daily.    . Multiple Vitamins-Minerals (PRESERVISION AREDS 2 PO) Take 1 capsule by mouth daily.     . Omega-3 Fatty Acids (FISH OIL) 1000 MG CAPS Take 1,000 mg by mouth daily.     . rosuvastatin (CRESTOR) 5 MG tablet Take 1 tablet (5 mg total) by mouth daily at 6 PM. 30 tablet 0   No facility-administered medications prior to visit.     Allergies:   Erythromycin   Social History   Socioeconomic History  . Marital status: Widowed    Spouse name: Not on file  . Number of children: Not on file  . Years of education: Not on file  . Highest education level: Not on file  Occupational History  . Not on file  Tobacco Use  . Smoking status: Former Research scientist (life sciences)  . Smokeless tobacco: Never Used  Substance and Sexual Activity  . Alcohol use: No    Alcohol/week: 0.0 standard drinks  . Drug use: Never  . Sexual activity: Not on file  Other Topics Concern  . Not on file  Social History Narrative  . Not on file   Social Determinants of Health   Financial Resource Strain:   . Difficulty of Paying Living Expenses: Not on file  Food Insecurity:   . Worried About Charity fundraiser in the Last Year: Not on file  . Ran Out of Food in the Last Year: Not on file  Transportation Needs:   . Lack of Transportation (Medical): Not on file  . Lack of Transportation (Non-Medical): Not on file  Physical Activity:   . Days of Exercise per Week: Not on file  . Minutes of Exercise per Session: Not on file  Stress:   . Feeling of Stress : Not on file  Social Connections:   . Frequency of Communication with Friends and Family: Not on file  . Frequency of Social Gatherings with Friends and Family: Not on file  . Attends Religious Services: Not on file  . Active Member of Clubs or Organizations: Not on file  . Attends Archivist Meetings: Not on file  .  Marital Status: Not on file    Social history is notable in that he was born in [place] and raised in [place].  He has been in [place] since 1941.  He had worked for [employer] for 37 years.  He retired at age 63-1[age]2.  He is widowed since 2007.  He had 3 children, 1 son died with an MI at age 27, a  daughter died at age 2, and he currently lives with his remaining son.  Family History: Family history is notable that his parents are deceased.  ROS General: Negative; No fevers, chills, or night sweats;  HEENT: Negative; No changes in vision or hearing, sinus congestion, difficulty swallowing Pulmonary: Negative; No cough, wheezing, shortness of breath, hemoptysis Cardiovascular: Negative; No chest pain, presyncope, syncope, palpitations GI: Negative; No nausea, vomiting, diarrhea, or abdominal pain GU: Negative; No dysuria, hematuria, or difficulty voiding Musculoskeletal: Negative; no myalgias, joint pain, or weakness Hematologic/Oncology: Status post splenectomy Endocrine: Negative; no heat/cold intolerance; no diabetes Neuro: Prior CVA in 2019 and documentation of several lacunar infarcts. Skin: Negative; No rashes or skin lesions Psychiatric: Negative; No behavioral problems, depression Sleep: Negative; No snoring, daytime sleepiness, hypersomnolence, bruxism, restless legs, hypnogognic hallucinations, no cataplexy Other comprehensive 14 point system review is negative.   PHYSICAL EXAM:   VS:  BP 122/73   Pulse 88   Temp 97.9 F (36.6 C)   Ht 5' 11"  (1.803 m)   Wt 186 lb 3.2 oz (84.5 kg)   SpO2 97%   BMI 25.97 kg/m     Repeat blood pressure by me 124/70  Wt Readings from Last 3 Encounters:  02/14/20 186 lb 3.2 oz (84.5 kg)  08/30/19 186 lb (84.4 kg)  08/17/19 183 lb 3.2 oz (83.1 kg)    General: Alert, oriented, no distress.  Skin: normal turgor, no rashes, warm and dry HEENT: Normocephalic, atraumatic. Pupils equal round and reactive to light; sclera  anicteric; extraocular muscles intact;  Nose without nasal septal hypertrophy Mouth/Parynx benign; Mallinpatti scale 2 Neck: No JVD, no carotid bruits; normal carotid upstroke Lungs: clear to ausculatation and percussion; no wheezing or rales Chest wall: without tenderness to palpitation Heart: PMI not displaced, RRR, s1 s2 normal, 2/6 systolic murmur in the aortic area, no diastolic murmur, no rubs, gallops, thrills, or heaves Abdomen: soft, nontender; no hepatosplenomehaly, BS+; abdominal aorta nontender and not dilated by palpation. Back: no CVA tenderness Pulses 2+ Musculoskeletal: full range of motion, normal strength, no joint deformities Extremities: Left foot in brace no clubbing cyanosis or edema, Homan's sign negative  Neurologic: grossly nonfocal; Cranial nerves grossly wnl Psychologic: Normal mood and affect   Studies/Labs Reviewed:   EKG:  EKG is ordered today.  ECG (independently read by me): Normal sinus rhythm at 86 bpm with an isolated PAC and PVC.  QTc interval 449 ms  August 17, 2019 ECG (independently read by me): NSR at 87; early transition; normal intervals  Recent Labs: BMP Latest Ref Rng & Units 10/20/2018 10/18/2018 10/18/2018  Glucose 70 - 99 mg/dL 132(H) 94 -  BUN 8 - 23 mg/dL 17 21 -  Creatinine 0.61 - 1.24 mg/dL 1.12 1.06 1.12  Sodium 135 - 145 mmol/L 140 141 -  Potassium 3.5 - 5.1 mmol/L 4.0 3.8 -  Chloride 98 - 111 mmol/L 102 109 -  CO2 22 - 32 mmol/L 29 22 -  Calcium 8.9 - 10.3 mg/dL 9.4 8.9 -     Hepatic Function Latest Ref Rng & Units 10/20/2018 10/18/2018 10/17/2018  Total Protein 6.5 - 8.1 g/dL 7.1 6.3(L) 7.1  Albumin 3.5 - 5.0 g/dL 3.9 3.7 4.0  AST 15 - 41 U/L 23 22 25   ALT 0 - 44 U/L 19 18 21   Alk Phosphatase 38 - 126 U/L 59 48 53  Total Bilirubin 0.3 - 1.2 mg/dL 1.2 0.9 0.6    CBC Latest Ref Rng & Units 10/20/2018 10/18/2018 10/18/2018  WBC 4.0 - 10.5 K/uL 9.3 8.1 8.1  Hemoglobin 13.0 - 17.0 g/dL 15.2 13.8 14.4  Hematocrit 39.0 -  52.0 % 47.3 43.4 44.7  Platelets 150 - 400 K/uL 299 281 283   Lab Results  Component Value Date   MCV 95.2 10/20/2018   MCV 96.7 10/18/2018   MCV 95.1 10/18/2018   No results found for: TSH Lab Results  Component Value Date   HGBA1C 6.5 (H) 10/18/2018     BNP No results found for: BNP  ProBNP No results found for: PROBNP   Lipid Panel     Component Value Date/Time   CHOL 160 12/07/2018 1317   TRIG 195 (H) 12/07/2018 1317   HDL 42 12/07/2018 1317   CHOLHDL 3.8 12/07/2018 1317   CHOLHDL 4.9 10/18/2018 0313   VLDL 22 10/18/2018 0313   LDLCALC 79 12/07/2018 1317     RADIOLOGY: ECHOCARDIOGRAM COMPLETE  Result Date: 02/06/2020    ECHOCARDIOGRAM REPORT   Patient Name:   SHYLO DILLENBECK Date of Exam: 02/06/2020 Medical Rec #:  [id number]        Height:       71.0 in Accession #:    [id number]       Weight:       186.0 lb Date of Birth:  20-Sep-1923        BSA:          2.04 m Patient Age:    53 years         BP:           117/69 mmHg Patient Gender: M                HR:           76 bpm. Exam Location:  [place] Procedure: 2D Echo, Cardiac Doppler and Color Doppler Indications:    I25.10 CAD native vessel; I163.9 Stroke  History:        Patient has prior history of Echocardiogram examinations, most                 recent 10/18/2018. Prior CABG; Risk Factors:Hypertension,                 Diabetes and Dyslipidemia. Chronic kidney disease.  Sonographer:    Diamond Nickel RCS Referring Phys: [place]  1. Left ventricular ejection fraction, by estimation, is 55 to 60%. The left ventricle has normal function. The left ventricle has no regional wall motion abnormalities. There is mild left ventricular hypertrophy. Left ventricular diastolic parameters are consistent with Grade I diastolic dysfunction (impaired relaxation).  2. Right ventricular systolic function is normal. The right ventricular size is normal. Peak RV-RA gradient 21 mmHg.  3. The mitral valve is  normal in structure and function. Trivial mitral valve regurgitation.  4. The aortic valve is tricuspid. Aortic valve regurgitation is mild. Suspect moderate aortic valve stenosis though mean gradient is only 15 mmHg. Aortic valve area, by VTI measures 1.25 cm. Aortic valve mean gradient measures 15.0 mmHg.  5. The IVC was not visualized. FINDINGS  Left Ventricle: Left ventricular ejection fraction, by estimation, is 55 to 60%. The left ventricle has normal function. The left ventricle has no regional wall motion abnormalities. The left ventricular internal cavity size was normal in size. There is  mild left ventricular hypertrophy. Left ventricular diastolic parameters are consistent with Grade I diastolic dysfunction (impaired relaxation). Right Ventricle: The right ventricular size is normal. No increase in right  ventricular wall thickness. Right ventricular systolic function is normal. Left Atrium: Left atrial size was normal in size. Right Atrium: Right atrial size was normal in size. Pericardium: There is no evidence of pericardial effusion. Mitral Valve: The mitral valve is normal in structure and function. There is mild calcification of the mitral valve leaflet(s). Mild mitral annular calcification. Trivial mitral valve regurgitation. Tricuspid Valve: The tricuspid valve is normal in structure. Tricuspid valve regurgitation is trivial. Aortic Valve: The aortic valve is tricuspid. Aortic valve regurgitation is mild. Aortic regurgitation PHT measures 378 msec. Moderate aortic stenosis is present. Aortic valve mean gradient measures 15.0 mmHg. Aortic valve peak gradient measures 28.7 mmHg. Aortic valve area, by VTI measures 1.25 cm. Pulmonic Valve: The pulmonic valve was normal in structure. Pulmonic valve regurgitation is trivial. Aorta: The aortic root is normal in size and structure. Venous: The inferior vena cava was not well visualized. IAS/Shunts: No atrial level shunt detected by color flow Doppler.   LEFT VENTRICLE PLAX 2D LVIDd:         3.85 cm  Diastology LVIDs:         2.80 cm  LV e' lateral:   8.27 cm/s LV PW:         1.35 cm  LV E/e' lateral: 10.1 LV IVS:        1.55 cm  LV e' medial:    5.44 cm/s LVOT diam:     2.10 cm  LV E/e' medial:  15.4 LV SV:         70.66 ml LV SV Index:   16.63 LVOT Area:     3.46 cm  RIGHT VENTRICLE RV Basal diam:  2.50 cm RV S prime:     7.72 cm/s TAPSE (M-mode): 1.6 cm RVSP:           24.3 mmHg LEFT ATRIUM             Index       RIGHT ATRIUM           Index LA diam:        3.90 cm 1.91 cm/m  RA Pressure: 3.00 mmHg LA Vol (A2C):   50.9 ml 24.90 ml/m RA Area:     8.99 cm LA Vol (A4C):   33.8 ml 16.53 ml/m RA Volume:   15.20 ml  7.43 ml/m LA Biplane Vol: 44.6 ml 21.81 ml/m  AORTIC VALVE AV Area (Vmax):    1.32 cm AV Area (Vmean):   1.28 cm AV Area (VTI):     1.25 cm AV Vmax:           268.00 cm/s AV Vmean:          179.000 cm/s AV VTI:            0.566 m AV Peak Grad:      28.7 mmHg AV Mean Grad:      15.0 mmHg LVOT Vmax:         102.00 cm/s LVOT Vmean:        66.000 cm/s LVOT VTI:          0.204 m LVOT/AV VTI ratio: 0.36 AI PHT:            378 msec  AORTA Ao Root diam: 3.50 cm MITRAL VALVE                TRICUSPID VALVE MV Area (PHT): 3.79 cm     TR Peak grad:   21.3 mmHg MV Decel Time: 200 msec  TR Vmax:        231.00 cm/s MV E velocity: 83.70 cm/s   Estimated RAP:  3.00 mmHg MV A velocity: 116.00 cm/s  RVSP:           24.3 mmHg MV E/A ratio:  0.72                             SHUNTS                             Systemic VTI:  0.20 m                             Systemic Diam: 2.10 cm Loralie Champagne MD Electronically signed by Loralie Champagne MD Signature Date/Time: 02/06/2020/9:48:32 PM    Final      Additional studies/ records that were reviewed today include:  I reviewed the patient's records from [facility] on March 10, 2019.  His echo Doppler study from October 17, 2018 was reviewed.  I reviewed records of Dr. Leonie Man.   ASSESSMENT:    1. CAD in native  artery   2. Hx of CABG   3. Nonrheumatic aortic valve stenosis   4. Hyperlipidemia with target LDL less than 70   5. Type 2 diabetes mellitus with complication, without long-term current use of insulin ([place])   6. H/O Lacunar stroke [facility])      PLAN:  Mr. Sylar Voong is a young appearing 87 year old gentleman who apparently underwent CABG revascularization surgery x5 in 1999 at [facility].  At that time, he had undergone a treadmill test which was abnormal leading to catheterization demonstration of multivessel CAD.  He was unaware of ever suffering a myocardial infarction.  He has seen Dr. Chilton Greathouse in follow-up for his cardiovascular care when he moved to live with his son in [place] he established all his care in [place] now sees Dr. Ashby Dawes for primary care and me for cardiology care.  Presently, his blood pressure is well controlled on low-dose Toprol-XL 12.5 mg daily.  His ECG shows normal sinus rhythm rare PAC and PVC.  I reviewed his echo Doppler study which shows normal systolic function with grade 1 diastolic dysfunction.  He has at least mild to moderate aortic stenosis with a mean gradient of 15 and peak gradient of 28.  He does not have any anginal symptoms.  He denies any presyncope or syncope or significant exertional dyspnea.  His walking is limited since he walks with a cane or typically uses a walker at home.  He also has a left foot brace.  He is diabetic on glipizide.  He continues to be on low-dose rosuvastatin.  Dr. Ashby Dawes  has checked laboratory.  Presently, we will continue with his current medical regimen.  I will see him in 6 months for reevaluation or sooner if problems arise.   Medication Adjustments/Labs and Tests Ordered: Current medicines are reviewed at length with the patient today.  Concerns regarding medicines are outlined above.  Medication changes, Labs and Tests ordered today are listed in the Patient Instructions below. Patient Instructions   Medication Instructions:    METOPROLOL SUCCINATE   If you need a refill on your cardiac medications before your next appointment, please call your pharmacy.   Lab work:     If you have labs (blood work) drawn today and your  tests are completely normal, you will receive your results only by: Marland Kitchen MyChart Message (if you have MyChart) OR . A paper copy in the mail If you have any lab test that is abnormal or we need to change your treatment, we will call you to review the results.  Testing/Procedures:   Follow-Up: At [facility], you and your health needs are our priority.  As part of our continuing mission to provide you with exceptional heart care, we have created designated Provider Care Teams.  These Care Teams include your primary Cardiologist (physician) and Advanced Practice Providers (APPs -  Physician Assistants and Nurse Practitioners) who all work together to provide you with the care you need, when you need it. .      Any Other Special Instructions Will Be Listed Below (If Applicable).    Signed, Zachary Majestic, MD  02/15/2020 5:04 PM    [place] [address], [place], [place], [place]  [address] Phone: [phone]

## 2020-02-14 NOTE — Patient Instructions (Signed)
Medication Instructions:  CONTINUE WITH CURRENT MEDICATIONS. NO CHANGES.  *If you need a refill on your cardiac medications before your next appointment, please call your pharmacy*  Follow-Up: At [facility], you and your health needs are our priority.  As part of our continuing mission to provide you with exceptional heart care, we have created designated Provider Care Teams.  These Care Teams include your primary Cardiologist (physician) and Advanced Practice Providers (APPs -  Physician Assistants and Nurse Practitioners) who all work together to provide you with the care you need, when you need it.  Your next appointment:   6 month(s)  The format for your next appointment:   In Person  Provider:   Thomas Kelly, MD    

## 2020-02-15 ENCOUNTER — Encounter: Payer: Self-pay | Admitting: Cardiovascular Disease

## 2020-02-21 ENCOUNTER — Ambulatory Visit: Payer: Medicare Other | Admitting: Podiatry

## 2020-02-28 ENCOUNTER — Other Ambulatory Visit: Payer: Self-pay

## 2020-02-28 ENCOUNTER — Ambulatory Visit: Payer: Medicare Other | Admitting: Adult Health

## 2020-02-28 ENCOUNTER — Encounter: Payer: Self-pay | Admitting: Adult Health

## 2020-02-28 VITALS — BP 118/73 | HR 88 | Temp 98.3°F | Ht 71.0 in | Wt 188.0 lb

## 2020-02-28 DIAGNOSIS — I10 Essential (primary) hypertension: Secondary | ICD-10-CM | POA: Diagnosis not present

## 2020-02-28 DIAGNOSIS — E782 Mixed hyperlipidemia: Secondary | ICD-10-CM

## 2020-02-28 DIAGNOSIS — E1169 Type 2 diabetes mellitus with other specified complication: Secondary | ICD-10-CM

## 2020-02-28 DIAGNOSIS — I6381 Other cerebral infarction due to occlusion or stenosis of small artery: Secondary | ICD-10-CM

## 2020-02-28 NOTE — Progress Notes (Deleted)
[facility] [address] [place]. [place] [address] [phone]       OFFICE FOLLOW-UP NOTE  [name] Date of Birth:  06-Apr-1923 Medical Record Number:  [id number]     HPI:   Zachary Spence is a 86 year old male who is being seen today, 02/28/2020, for stroke follow-up.  Zachary Spence has been stable from a stroke standpoint without new or reoccurring stroke/TIA symptoms.  Continues on aspirin 81 mg daily and Crestor 5 mg daily for secondary stroke prevention without side effects.  Blood pressure today ***.  Glucose levels ***.  Repeat carotid ultrasound showed bilateral ICA 1 to 39% stenosis and VAs antegrade.  No concerns at this time.    History provided for reference purposes only Office visit 12/07/2018 Dr. Leonie Spence: Zachary Spence is a pleasant 86 year old Caucasian male seen today for initial office visit following hospital admission for stroke in October 2019.  Zachary Spence is accompanied by his son and history is obtained from them and review of electronic medical records.  I have personally reviewed imaging films in PACS.  Zachary Spence is a 86 year old male with past medical history of hypertension, non-Hodgkin's lymphoma who presented with sudden onset of gait difficulties and being wobbly on his feet and dizzy.  Zachary Spence does have baseline left foot drop and uses an AFO to walk and uses normally a cane.  The son took him to urgent care where they referred him to [facility] for evaluation.  Zachary Spence presented beyond time window for TPA.  CT scan of the brain was unremarkable but MRI scan of the brain showed a small left corona radiata lacunar infarct.  CT angiogram of the head and neck showed no large vessel stenosis or occlusion. but showed a small parotid nodule which had slightly increased in size compared to 2016.  LDL cholesterol was elevated at 132 mg percent.  Hemoglobin A1c was 6.5.  Transthoracic echo showed normal ejection fraction without cardiac source of embolism.  Patient was seen by physical  occupational therapy and transferred to inpatient rehab.  Zachary Spence was placed on dual antiplatelet therapy of aspirin and Plavix.  Zachary Spence has done well in rehab and is currently at home living with his son.  Zachary Spence started home physical and occupational therapy few weeks ago.  Zachary Spence is able to walk with a walker with left foot drop and ankle foot brace.  Zachary Spence is tolerating aspirin and Plavix with minor bruising.  In fact Zachary Spence scraped his forearm this morning and wait to my office and has abrasion on his left elbow.  Zachary Spence states his sugars are well controlled.  Is tolerating Crestor without muscle aches or pains.  Zachary Spence has not had any follow-up lipid profile checked. Virtual video visit 03/14/2019 Dr. Leonie Spence :Zachary Spence was seen today for a virtual telephonic visit.  Zachary Spence has history of remote lacunar stroke in October 2019 from which she has recovered well.  Zachary Spence states Zachary Spence has had no recurrent stroke or TIA symptoms.  Zachary Spence continues to have minimum right-sided weakness.  Zachary Spence has mild numbness in his hands from carpal tunnel.  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  Is working with physical therapist to get new foot brace.  Zachary Spence recently saw his primary care physician 03/10/2019 and had lab work done and lipid profile and hemoglobin A1c was satisfactory.  Hemoglobin A1c was 6.8.  Total cholesterol 172, triglyceride 205.  Zachary Spence is tolerating aspirin well without side effects.  Zachary Spence is also on glipizide for diabetes  and Crestor 5 mg for his lipids.  Zachary Spence has no neurological complaints. Update 08/30/2019 Dr. Leonie Spence : Zachary Spence returns for follow-up after last virtual video visit on 03/14/2019.  Zachary Spence is accompanied by a lady friend as his son could not make it today.  Zachary Spence continues to do well and has not had recurrent stroke or TIA symptoms since his stroke in October 2019.  Zachary Spence remains on aspirin which is tolerating well without bruising or bleeding.  His blood pressure remains well controlled and today it is 1 one 7/69.  Patient is currently doing  outpatient physical therapy via video visits.  Zachary Spence continues to walk with a cane and now has a new AFO for his left foot drop and states is done well Zachary Spence has had no falls or injuries.  Zachary Spence states his control of his sugar is also good and last hemoglobin A1c was 6.7.  Zachary Spence had some lab work done 2 weeks ago by his primary physician for cholesterol check but I do not have those results.  Patient continues to have tingling and numbness in his hands from his carpal tunnel but states that it is stable and not bothersome.  Zachary Spence has not had any follow-up carotid ultrasound checked since his admission for stroke last year.  Zachary Spence has no new complaints today.      ROS:   14 system review of systems is positive for ringing in the ears, decreased hearing, weakness, left foot drop, murmur and all other systems negative PMH:  Past Medical History:  Diagnosis Date  . Bowel obstruction ([place])   . Diabetes mellitus without complication ([place])   . Hypertension   . Non Hodgkin's lymphoma ([place])   . Stroke [facility])     Social History:  Social History   Socioeconomic History  . Marital status: Widowed    Spouse name: Not on file  . Number of children: Not on file  . Years of education: Not on file  . Highest education level: Not on file  Occupational History  . Not on file  Tobacco Use  . Smoking status: Former Research scientist (life sciences)  . Smokeless tobacco: Never Used  Substance and Sexual Activity  . Alcohol use: No    Alcohol/week: 0.0 standard drinks  . Drug use: Never  . Sexual activity: Not on file  Other Topics Concern  . Not on file  Social History Narrative  . Not on file   Social Determinants of Health   Financial Resource Strain:   . Difficulty of Paying Living Expenses: Not on file  Food Insecurity:   . Worried About Charity fundraiser in the Last Year: Not on file  . Ran Out of Food in the Last Year: Not on file  Transportation Needs:   . Lack of Transportation (Medical): Not on file  . Lack of  Transportation (Non-Medical): Not on file  Physical Activity:   . Days of Exercise per Week: Not on file  . Minutes of Exercise per Session: Not on file  Stress:   . Feeling of Stress : Not on file  Social Connections:   . Frequency of Communication with Friends and Family: Not on file  . Frequency of Social Gatherings with Friends and Family: Not on file  . Attends Religious Services: Not on file  . Active Member of Clubs or Organizations: Not on file  . Attends Archivist Meetings: Not on file  . Marital Status: Not on file  Intimate Partner Violence:   . Fear  of Current or Ex-Partner: Not on file  . Emotionally Abused: Not on file  . Physically Abused: Not on file  . Sexually Abused: Not on file    Medications:   Current Outpatient Medications on File Prior to Visit  Medication Sig Dispense Refill  . acetaminophen (TYLENOL) 325 MG tablet Take 2 tablets (650 mg total) by mouth every 4 (four) hours as needed for mild pain (or temp > 37.5 C (99.5 F)).    . ASPIRIN 81 PO Take by mouth.    . calcium carbonate (OS-CAL - DOSED IN MG OF ELEMENTAL CALCIUM) 1250 (500 Ca) MG tablet Take 1 tablet (1,250 mg total) by mouth daily with breakfast. 30 tablet 1  . folic acid (FOLVITE) 1 MG tablet Take 1 tablet (1 mg total) by mouth daily. 30 tablet 1  . gentamicin cream (GARAMYCIN) 0.1 % Apply 1 application topically 2 (two) times daily. 15 g 1  . glipiZIDE (GLUCOTROL) 5 MG tablet Take 1 tablet (5 mg total) by mouth daily. 30 tablet 1  . metoprolol succinate (TOPROL XL) 25 MG 24 hr tablet Take 0.5 tablets (12.5 mg total) by mouth daily. 45 tablet 3  . Multiple Vitamin (MULTIVITAMIN WITH MINERALS) TABS tablet Take 1 tablet by mouth daily.    . Multiple Vitamins-Minerals (PRESERVISION AREDS 2 PO) Take 1 capsule by mouth daily.     . Omega-3 Fatty Acids (FISH OIL) 1000 MG CAPS Take 1,000 mg by mouth daily.     . rosuvastatin (CRESTOR) 5 MG tablet Take 1 tablet (5 mg total) by mouth daily at  6 PM. 30 tablet 0   No current facility-administered medications on file prior to visit.    Allergies:   Allergies  Allergen Reactions  . Erythromycin Swelling    Swelling of hands    Physical Exam General: well developed, well nourished frail elderly Caucasian male patient, seated, in no evident distress Head: head normocephalic and atraumatic.  Neck: supple with no carotid or supraclavicular bruits Cardiovascular: regular rate and rhythm, ejection systolic murmur heard all over the precordium. Musculoskeletal: no deformity Skin:  no rash/petichiae.  Small abrasion over the left elbow. Vascular:  Normal pulses all extremities There were no vitals filed for this visit. Neurologic Exam Mental Status: Awake and fully alert. Oriented to place and time. Recent and remote memory slightly diminished. Attention span, concentration and fund of knowledge appropriate. Mood and affect appropriate.  Cranial Nerves: Fundoscopic exam not done.  Pupils equal, briskly reactive to light. Extraocular movements full without nystagmus. Visual fields full to confrontation. Hearing diminished bilaterally. Facial sensation intact. Face, tongue, palate moves normally and symmetrically.  Motor: Normal bulk and tone. Normal strength in all tested extremity muscles except left foot drop which is chronic in mild bilateral ankle dorsiflexor weakness left greater than right.  Diminished fine finger movements on the left.  Minimal weakness of left grip.  Orbits right over left upper extremity.. Zachary Spence has left foot drop Sensory.: intact to touch ,pinprick .position and vibratory sensation.  Coordination: Rapid alternating movements normal in all extremities. Finger-to-nose and heel-to-shin performed accurately bilaterally. Gait and Station: Arises from chair without difficulty. Stance is normal.  Zachary Spence uses a walker and has a left foot brace and drags left foot. Reflexes: 1+ and symmetric. Toes downgoing.   NIHSS   0 Modified Rankin  2  ASSESSMENT: 86 year old Caucasian male with left brain subcortical lacunar infarct in October 2019 from which she is doing very well with excellent recovery.  Vascular risk  factors of diabetes, hyperlipidemia and age.  History of chronic bilateral carpal tunnel syndrome as well as left foot drop which are all stable as well.     PLAN: -Continue aspirin 81 mg daily  for secondary stroke prevention  and  -maintain strict control of hypertension with blood pressure goal below 130/90, diabetes with hemoglobin A1c goal below 6.5% and lipids with LDL cholesterol goal below 70 mg/dL. I also advised the patient to eat a healthy diet with plenty of whole grains, cereals, fruits and vegetables, exercise regularly and maintain ideal body weight .  Check f/u carotid dopplers. Zachary Spence was encouraged to use his cane and left ankle foot brace at all times for walking for fall and safety and.    Followup in the future with my nurse practitioner Janett Billow  in  6 months or call earlier if necessary   Greater than 50% of time during this 25 minute visit was spent on counseling,explanation of diagnosis, planning of further management, discussion with patient and family and coordination of care  Frann Rider, [facility]  [facility] [address] [place], [place] [address]  Phone [phone] Fax [phone] Note: This document was prepared with digital dictation and possible smart phrase technology. Any transcriptional errors that result from this process are unintentional.

## 2020-02-28 NOTE — Progress Notes (Signed)
[facility] [address] [place]. [place] [address] [phone]       OFFICE FOLLOW-UP NOTE  [name] Date of Birth:  1923-09-29 Medical Record Number:  [id number]   Reason for visit: Stroke follow-up  Chief complaint: Chief Complaint  Patient presents with  . Follow-up    RM9, alone. No concerns, 6 mo f/u      HPI:    Zachary Spence is a 86 year old male who is being seen today, 02/28/2020, for stroke follow-up.  He has been stable from a stroke standpoint without new or reoccurring stroke/TIA symptoms. He currently lives with his son and remains active by walking daily. Denies any dizziness or falls. He does have left foot drop at baseline and wears AFO to walk and uses a cane. Continues on aspirin 81 mg daily and Crestor 5 mg daily for secondary stroke prevention without side effects. Blood pressure today 118/73. Repeat carotid ultrasound showed bilateral ICA 1 to 39% stenosis and VAs antegrade. No concerns at this time.   History provided for reference purposes only Office visit 12/07/2018 Dr. Leonie Spence: Zachary Spence is a pleasant 86 year old Caucasian male seen today for initial office visit following hospital admission for stroke in October 2019.  He is accompanied by his son and history is obtained from them and review of electronic medical records.  I have personally reviewed imaging films in PACS.  He is a 86 year old male with past medical history of hypertension, non-Hodgkin's lymphoma who presented with sudden onset of gait difficulties and being wobbly on his feet and dizzy.  He does have baseline left foot drop and uses an AFO to walk and uses normally a cane.  The son took him to urgent care where they referred him to [facility] for evaluation.  He presented beyond time window for TPA.  CT scan of the brain was unremarkable but MRI scan of the brain showed a small left corona radiata lacunar infarct.  CT angiogram of the head and neck showed no large  vessel stenosis or occlusion. but showed a small parotid nodule which had slightly increased in size compared to 2016.  LDL cholesterol was elevated at 132 mg percent.  Hemoglobin A1c was 6.5.  Transthoracic echo showed normal ejection fraction without cardiac source of embolism.  Patient was seen by physical occupational therapy and transferred to inpatient rehab.  He was placed on dual antiplatelet therapy of aspirin and Plavix.  He has done well in rehab and is currently at home living with his son.  He started home physical and occupational therapy few weeks ago.  He is able to walk with a walker with left foot drop and ankle foot brace.  He is tolerating aspirin and Plavix with minor bruising.  In fact he scraped his forearm this morning and wait to my office and has abrasion on his left elbow.  He states his sugars are well controlled.  Is tolerating Crestor without muscle aches or pains.  He has not had any follow-up lipid profile checked. Virtual video visit 03/14/2019 Dr. Leonie Spence :Zachary Spence was seen today for a virtual telephonic visit.  He has history of remote lacunar stroke in October 2019 from which she has recovered well.  He states he has had no recurrent stroke or TIA symptoms.  He continues to have minimum right-sided weakness.  He has mild numbness in his hands from carpal tunnel.  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  Is working with physical  therapist to get new foot brace.  He recently saw his primary care physician 03/10/2019 and had lab work done and lipid profile and hemoglobin A1c was satisfactory.  Hemoglobin A1c was 6.8.  Total cholesterol 172, triglyceride 205.  He is tolerating aspirin well without side effects.  He is also on glipizide for diabetes and Crestor 5 mg for his lipids.  He has no neurological complaints. Update 08/30/2019 Dr. Leonie Spence : He returns for follow-up after last virtual video visit on 03/14/2019.  He is accompanied by a lady friend as his son  could not make it today.  He continues to do well and has not had recurrent stroke or TIA symptoms since his stroke in October 2019.  He remains on aspirin which is tolerating well without bruising or bleeding.  His blood pressure remains well controlled and today it is 1 one 7/69.  Patient is currently doing outpatient physical therapy via video visits.  He continues to walk with a cane and now has a new AFO for his left foot drop and states is done well he has had no falls or injuries.  He states his control of his sugar is also good and last hemoglobin A1c was 6.7.  He had some lab work done 2 weeks ago by his primary physician for cholesterol check but I do not have those results. Patient continues to have tingling and numbness in his hands from his carpal tunnel but states that it is stable and not bothersome.  He has not had any follow-up carotid ultrasound checked since his admission for stroke last year.  He has no new complaints today.  ROS:   14 system review of systems is positive for decreased hearing and left foot drop, all other systems negative.   PMH:  Past Medical History:  Diagnosis Date  . Bowel obstruction ([place])   . Diabetes mellitus without complication ([place])   . Hypertension   . Non Hodgkin's lymphoma ([place])   . Stroke [facility])     Social History:  Social History   Socioeconomic History  . Marital status: Widowed    Spouse name: Not on file  . Number of children: Not on file  . Years of education: Not on file  . Highest education level: Not on file  Occupational History  . Not on file  Tobacco Use  . Smoking status: Former Research scientist (life sciences)  . Smokeless tobacco: Never Used  Substance and Sexual Activity  . Alcohol use: No    Alcohol/week: 0.0 standard drinks  . Drug use: Never  . Sexual activity: Not on file  Other Topics Concern  . Not on file  Social History Narrative  . Not on file   Social Determinants of Health   Financial Resource Strain:   . Difficulty of  Paying Living Expenses: Not on file  Food Insecurity:   . Worried About Charity fundraiser in the Last Year: Not on file  . Ran Out of Food in the Last Year: Not on file  Transportation Needs:   . Lack of Transportation (Medical): Not on file  . Lack of Transportation (Non-Medical): Not on file  Physical Activity:   . Days of Exercise per Week: Not on file  . Minutes of Exercise per Session: Not on file  Stress:   . Feeling of Stress : Not on file  Social Connections:   . Frequency of Communication with Friends and Family: Not on file  . Frequency of Social Gatherings with Friends and Family:  Not on file  . Attends Religious Services: Not on file  . Active Member of Clubs or Organizations: Not on file  . Attends Archivist Meetings: Not on file  . Marital Status: Not on file  Intimate Partner Violence:   . Fear of Current or Ex-Partner: Not on file  . Emotionally Abused: Not on file  . Physically Abused: Not on file  . Sexually Abused: Not on file    Medications:   Current Outpatient Medications on File Prior to Visit  Medication Sig Dispense Refill  . acetaminophen (TYLENOL) 325 MG tablet Take 2 tablets (650 mg total) by mouth every 4 (four) hours as needed for mild pain (or temp > 37.5 C (99.5 F)).    . ASPIRIN 81 PO Take by mouth.    . calcium carbonate (OS-CAL - DOSED IN MG OF ELEMENTAL CALCIUM) 1250 (500 Ca) MG tablet Take 1 tablet (1,250 mg total) by mouth daily with breakfast. 30 tablet 1  . folic acid (FOLVITE) 1 MG tablet Take 1 tablet (1 mg total) by mouth daily. 30 tablet 1  . gentamicin cream (GARAMYCIN) 0.1 % Apply 1 application topically 2 (two) times daily. 15 g 1  . glipiZIDE (GLUCOTROL) 5 MG tablet Take 1 tablet (5 mg total) by mouth daily. 30 tablet 1  . metoprolol succinate (TOPROL XL) 25 MG 24 hr tablet Take 0.5 tablets (12.5 mg total) by mouth daily. 45 tablet 3  . Multiple Vitamin (MULTIVITAMIN WITH MINERALS) TABS tablet Take 1 tablet by mouth  daily.    . Multiple Vitamins-Minerals (PRESERVISION AREDS 2 PO) Take 1 capsule by mouth daily.     . Omega-3 Fatty Acids (FISH OIL) 1000 MG CAPS Take 1,000 mg by mouth daily.     . vitamin B-12 (CYANOCOBALAMIN) 500 MCG tablet Take 500 mcg by mouth daily.    . rosuvastatin (CRESTOR) 5 MG tablet Take 1 tablet (5 mg total) by mouth daily at 6 PM. 30 tablet 0   No current facility-administered medications on file prior to visit.    Allergies:   Allergies  Allergen Reactions  . Erythromycin Swelling    Swelling of hands    Physical Exam  Today's Vitals   02/28/20 1041  BP: 118/73  Pulse: 88  Temp: 98.3 F (36.8 C)  Weight: 188 lb (85.3 kg)  Height: 5\' 11"  (1.803 m)   Body mass index is 26.22 kg/m.  General: well developed, well nourished frail elderly Caucasian male patient, seated, in no evident distress Head: head normocephalic and atraumatic.  Neck: supple with no carotid or supraclavicular bruits Cardiovascular: regular rate and rhythm, ejection systolic murmur heard all over the precordium. Musculoskeletal: no deformity Skin:  no rash/petichiae.  Small abrasion over the left elbow. Vascular:  Normal pulses all extremities  Neurologic Exam Mental Status: Awake and fully alert.  Normal speech and language.  Oriented to place and time. Memory intact. Attention span, concentration and fund of knowledge appropriate. Mood and affect appropriate.  Cranial Nerves: Pupils equal, briskly reactive to light. Extraocular movements full without nystagmus. Visual fields full to confrontation. Hearing diminished bilaterally. Facial sensation intact. Face, tongue, palate moves normally and symmetrically.  Motor: Normal bulk and tone. Normal strength in all tested extremity muscles except left foot drop which is chronic in mild bilateral ankle dorsiflexor weakness left greater than right. Diminished fine finger movements on the left.He has left foot drop Sensory.: intact to touch ,pinprick  .position and vibratory sensation.  Coordination: Rapid alternating movements  normal in all extremities. Finger-to-nose and heel-to-shin performed accurately bilaterally. Gait and Station: Arises from chair without difficulty. Stance is normal.  He uses a cane and has a left foot brace and drags left foot. Reflexes: 1+ and symmetric. Toes downgoing.      ASSESSMENT: 86 year old Caucasian male with left brain subcortical lacunar infarct in October 2019 without residual deficit.  Vascular risk factors of diabetes, hyperlipidemia and age.  History of chronic bilateral carpal tunnel syndrome as well as left foot drop which are all stable as well.  Repeat carotid ultrasound unremarkable.   PLAN: -Continue aspirin and crestor for secondary stroke prevention -Maintain strict control of hypertension with blood pressure goal below 130/90, diabetes with hemoglobin A1c goal below 6.5% and lipids with LDL cholesterol goal below 70 mg/dL. I also advised the patient to eat a healthy diet with plenty of whole grains, cereals, fruits and vegetables, exercise regularly and maintain ideal body weight .  -Continue to use cane and left ankle foot brace at all times for walking. -Follow up with PCP for DM, HTN, and HLD management    Follow up as needed as overall stable from stroke point or call sooner if necessary   I spent 20 minutes of face-to-face and non-face-to-face time with patient.  This included previsit chart review, lab review, study review, order entry, electronic health record documentation, patient education    Frann Rider, [facility]  [facility] [address] [place], [place] [address]  Phone [phone] Fax [phone] Note: This document was prepared with digital dictation and possible smart phrase technology. Any transcriptional errors that result from this process are unintentional.

## 2020-02-28 NOTE — Patient Instructions (Addendum)
Continue taking Asprin and Crestor for secondary stroke prevention.  Continue to follow up with PCP for HTN, HLD and DM management.  Follow up as needed or call us if you have any concerns.

## 2020-02-28 NOTE — Progress Notes (Signed)
I agree with the above plan 

## 2020-03-29 DIAGNOSIS — H35013 Changes in retinal vascular appearance, bilateral: Secondary | ICD-10-CM | POA: Diagnosis not present

## 2020-03-29 DIAGNOSIS — H353114 Nonexudative age-related macular degeneration, right eye, advanced atrophic with subfoveal involvement: Secondary | ICD-10-CM | POA: Diagnosis not present

## 2020-03-29 DIAGNOSIS — H35033 Hypertensive retinopathy, bilateral: Secondary | ICD-10-CM | POA: Diagnosis not present

## 2020-03-29 DIAGNOSIS — H353122 Nonexudative age-related macular degeneration, left eye, intermediate dry stage: Secondary | ICD-10-CM | POA: Diagnosis not present

## 2020-03-29 DIAGNOSIS — H524 Presbyopia: Secondary | ICD-10-CM | POA: Diagnosis not present

## 2020-04-10 ENCOUNTER — Other Ambulatory Visit: Payer: Self-pay

## 2020-04-10 ENCOUNTER — Encounter: Payer: Self-pay | Admitting: Podiatry

## 2020-04-10 ENCOUNTER — Ambulatory Visit (INDEPENDENT_AMBULATORY_CARE_PROVIDER_SITE_OTHER): Payer: Medicare Other | Admitting: Podiatry

## 2020-04-10 VITALS — Temp 97.1°F

## 2020-04-10 DIAGNOSIS — M79676 Pain in unspecified toe(s): Secondary | ICD-10-CM

## 2020-04-10 DIAGNOSIS — N182 Chronic kidney disease, stage 2 (mild): Secondary | ICD-10-CM

## 2020-04-10 DIAGNOSIS — B351 Tinea unguium: Secondary | ICD-10-CM

## 2020-04-10 DIAGNOSIS — E119 Type 2 diabetes mellitus without complications: Secondary | ICD-10-CM | POA: Diagnosis not present

## 2020-04-10 NOTE — Progress Notes (Signed)
This patient returns to my office for at risk foot care.  This patient requires this care by a professional since this patient will be at risk due to having diabetes type 2 and chronic kidney disease. This patient is unable to cut nails himself since the patient cannot reach his nails.These nails are painful walking and wearing shoes.  This patient presents for at risk foot care today.  General Appearance  Alert, conversant and in no acute stress.  Vascular  Dorsalis pedis and posterior tibial  pulses are  Weakly palpable  bilaterally.  Capillary return is within normal limits  bilaterally. Cold feet.  Neurologic  Senn-Weinstein monofilament wire test within normal limits  bilaterally. Muscle power within normal limits bilaterally.  Nails Thick disfigured discolored nails with subungual debris  from hallux to fifth toes bilaterally. No evidence of bacterial infection or drainage bilaterally.  Orthopedic  No limitations of motion  feet .  No crepitus or effusions noted.  No bony pathology or digital deformities noted.  Skin  normotropic skin with no porokeratosis noted bilaterally.  No signs of infections or ulcers noted.     Onychomycosis  Pain in right toes  Pain in left toes  Consent was obtained for treatment procedures.   Mechanical debridement of nails 1-5  bilaterally performed with a nail nipper.  Filed with dremel without incident.    Return office visit   4 months                   Told patient to return for periodic foot care and evaluation due to potential at risk complications.   Gardiner Barefoot DPM

## 2020-05-28 DIAGNOSIS — E1165 Type 2 diabetes mellitus with hyperglycemia: Secondary | ICD-10-CM | POA: Diagnosis not present

## 2020-05-28 DIAGNOSIS — E782 Mixed hyperlipidemia: Secondary | ICD-10-CM | POA: Diagnosis not present

## 2020-05-28 DIAGNOSIS — I2510 Atherosclerotic heart disease of native coronary artery without angina pectoris: Secondary | ICD-10-CM | POA: Diagnosis not present

## 2020-06-04 DIAGNOSIS — E1169 Type 2 diabetes mellitus with other specified complication: Secondary | ICD-10-CM | POA: Diagnosis not present

## 2020-06-04 DIAGNOSIS — E1165 Type 2 diabetes mellitus with hyperglycemia: Secondary | ICD-10-CM | POA: Diagnosis not present

## 2020-06-04 DIAGNOSIS — E1142 Type 2 diabetes mellitus with diabetic polyneuropathy: Secondary | ICD-10-CM | POA: Diagnosis not present

## 2020-06-04 DIAGNOSIS — E782 Mixed hyperlipidemia: Secondary | ICD-10-CM | POA: Diagnosis not present

## 2020-06-04 DIAGNOSIS — I2510 Atherosclerotic heart disease of native coronary artery without angina pectoris: Secondary | ICD-10-CM | POA: Diagnosis not present

## 2020-06-14 DIAGNOSIS — H6123 Impacted cerumen, bilateral: Secondary | ICD-10-CM | POA: Diagnosis not present

## 2020-07-11 DIAGNOSIS — H903 Sensorineural hearing loss, bilateral: Secondary | ICD-10-CM | POA: Diagnosis not present

## 2020-08-03 ENCOUNTER — Encounter: Payer: Self-pay | Admitting: Podiatry

## 2020-08-03 ENCOUNTER — Other Ambulatory Visit: Payer: Self-pay

## 2020-08-03 ENCOUNTER — Ambulatory Visit: Payer: Medicare Other | Admitting: Podiatry

## 2020-08-03 DIAGNOSIS — B351 Tinea unguium: Secondary | ICD-10-CM | POA: Diagnosis not present

## 2020-08-03 DIAGNOSIS — E119 Type 2 diabetes mellitus without complications: Secondary | ICD-10-CM | POA: Diagnosis not present

## 2020-08-03 DIAGNOSIS — N182 Chronic kidney disease, stage 2 (mild): Secondary | ICD-10-CM | POA: Diagnosis not present

## 2020-08-03 DIAGNOSIS — M79676 Pain in unspecified toe(s): Secondary | ICD-10-CM

## 2020-08-03 DIAGNOSIS — M21371 Foot drop, right foot: Secondary | ICD-10-CM | POA: Diagnosis not present

## 2020-08-03 NOTE — Progress Notes (Signed)
This patient returns to my office for at risk foot care.  This patient requires this care by a professional since this patient will be at risk due to having diabetes type 2 and chronic kidney disease.  This patient is unable to cut nails himself since the patient cannot reach his nails.These nails are painful walking and wearing shoes.  Patient has self avulsed fourth toenail right foot.  This patient presents for at risk foot care today.  General Appearance  Alert, conversant and in no acute stress.  Vascular  Dorsalis pedis and posterior tibial  pulses are  Weakly palpable  bilaterally.  Capillary return is within normal limits  bilaterally. Cold feet.  Neurologic  Senn-Weinstein monofilament wire test within normal limits  bilaterally. Muscle power within normal limits bilaterally.  Nails Thick disfigured discolored nails with subungual debris  from hallux to fifth toes bilaterally. No evidence of bacterial infection or drainage bilaterally.  Orthopedic  No limitations of motion  feet .  No crepitus or effusions noted.  No bony pathology or digital deformities noted.  Skin  normotropic skin with no porokeratosis noted bilaterally.  No signs of infections or ulcers noted.     Onychomycosis  Pain in right toes  Pain in left toes  Consent was obtained for treatment procedures.   Mechanical debridement of nails 1-5  bilaterally performed with a nail nipper.  Filed with dremel without incident. Neosporin/DSD applied to fourth toe right foot.    Return office visit   3 months                   Told patient to return for periodic foot care and evaluation due to potential at risk complications.   Gardiner Barefoot DPM

## 2020-08-14 ENCOUNTER — Ambulatory Visit: Payer: Medicare Other | Admitting: Podiatry

## 2020-08-15 ENCOUNTER — Ambulatory Visit: Payer: Medicare Other | Admitting: Physician Assistant

## 2020-08-24 DIAGNOSIS — H905 Unspecified sensorineural hearing loss: Secondary | ICD-10-CM | POA: Diagnosis not present

## 2020-09-09 NOTE — Progress Notes (Signed)
Cardiology Office Note:    Date:  09/12/2020   ID:  Zachary Spence, DOB [birth year], MRN [id number]  PCP:  Zachary Seashore, MD  Cardiologist:  Zachary Majestic, MD   Referring MD: Zachary Seashore, MD   No chief complaint on file. CAD, AS  History of Present Illness:    Zachary Spence is a 86 y.o. male with a hx of CAD s/p CABG x 5 in 1999, previously followed by [facility]. He established care with Zachary Spence in 2020. He has DM2, HLD, and suffered a lacunar stroke 2019. Echo  01/2020 with EF 55-60%, mild LVH and grade 1 DD, mild AI, moderate AS. He was last seen by Zachary Spence 02/14/20. He wears a left foot brace for foot drop. He walks with a walker/cane. He is maintained on 12.5 mg toprol and 5 mg crestor.   He presents today for 6 month follow up. He presents with his son. He goes to the gym with his son 5 days per week for 45 min doing cardio and weight training. No recent falls. Last fall was last year during PT. He is getting hearing aids tomorrow. His   He is fully vaccinated and is interested in booster.    Past Medical History:  Diagnosis Date  . Bowel obstruction ([place])   . Diabetes mellitus without complication ([place])   . Hypertension   . Non Hodgkin's lymphoma ([place])   . Stroke [facility])     Past Surgical History:  Procedure Laterality Date  . APPENDECTOMY    . CORONARY ARTERY BYPASS GRAFT    . SPLENECTOMY      Current Medications: Current Meds  Medication Sig  . acetaminophen (TYLENOL) 325 MG tablet Take 2 tablets (650 mg total) by mouth every 4 (four) hours as needed for mild pain (or temp > 37.5 C (99.5 F)).  . ASPIRIN 81 PO Take by mouth.  . calcium carbonate (OS-CAL - DOSED IN MG OF ELEMENTAL CALCIUM) 1250 (500 Ca) MG tablet Take 1 tablet (1,250 mg total) by mouth daily with breakfast.  . folic acid (FOLVITE) 1 MG tablet Take 1 tablet (1 mg total) by mouth daily.  Marland Kitchen gentamicin cream (GARAMYCIN) 0.1 % Apply 1 application topically 2 (two) times daily.  Marland Kitchen  glipiZIDE (GLUCOTROL) 5 MG tablet Take 1 tablet (5 mg total) by mouth daily.  . metoprolol succinate (TOPROL XL) 25 MG 24 hr tablet Take 0.5 tablets (12.5 mg total) by mouth daily.  . Multiple Vitamin (MULTIVITAMIN WITH MINERALS) TABS tablet Take 1 tablet by mouth daily.  . Multiple Vitamins-Minerals (PRESERVISION AREDS 2 PO) Take 1 capsule by mouth daily.   . Omega-3 Fatty Acids (FISH OIL) 1000 MG CAPS Take 1,000 mg by mouth daily.   . vitamin B-12 (CYANOCOBALAMIN) 500 MCG tablet Take 500 mcg by mouth daily.  . [DISCONTINUED] metoprolol succinate (TOPROL XL) 25 MG 24 hr tablet Take 0.5 tablets (12.5 mg total) by mouth daily.     Allergies:   Erythromycin   Social History   Socioeconomic History  . Marital status: Widowed    Spouse name: Not on file  . Number of children: Not on file  . Years of education: Not on file  . Highest education level: Not on file  Occupational History  . Not on file  Tobacco Use  . Smoking status: Former Research scientist (life sciences)  . Smokeless tobacco: Never Used  Substance and Sexual Activity  . Alcohol use: No    Alcohol/week: 0.0 standard drinks  .  Drug use: Never  . Sexual activity: Not on file  Other Topics Concern  . Not on file  Social History Narrative  . Not on file   Social Determinants of Health   Financial Resource Strain:   . Difficulty of Paying Living Expenses: Not on file  Food Insecurity:   . Worried About Charity fundraiser in the Last Year: Not on file  . Ran Out of Food in the Last Year: Not on file  Transportation Needs:   . Lack of Transportation (Medical): Not on file  . Lack of Transportation (Non-Medical): Not on file  Physical Activity:   . Days of Exercise per Week: Not on file  . Minutes of Exercise per Session: Not on file  Stress:   . Feeling of Stress : Not on file  Social Connections:   . Frequency of Communication with Friends and Family: Not on file  . Frequency of Social Gatherings with Friends and Family: Not on file  .  Attends Religious Services: Not on file  . Active Member of Clubs or Organizations: Not on file  . Attends Archivist Meetings: Not on file  . Marital Status: Not on file     Family History: The patient's family history is not on file.  ROS:   Please see the history of present illness.     All other systems reviewed and are negative.  EKGs/Labs/Other Studies Reviewed:    The following studies were reviewed today:  Echo 02/06/20: 1. Left ventricular ejection fraction, by estimation, is 55 to 60%. The  left ventricle has normal function. The left ventricle has no regional  wall motion abnormalities. There is mild left ventricular hypertrophy.  Left ventricular diastolic parameters  are consistent with Grade I diastolic dysfunction (impaired relaxation).  2. Right ventricular systolic function is normal. The right ventricular  size is normal. Peak RV-RA gradient 21 mmHg.  3. The mitral valve is normal in structure and function. Trivial mitral  valve regurgitation.  4. The aortic valve is tricuspid. Aortic valve regurgitation is mild.  Suspect moderate aortic valve stenosis though mean gradient is only 15  mmHg. Aortic valve area, by VTI measures 1.25 cm. Aortic valve mean  gradient measures 15.0 mmHg.  5. The IVC was not visualized.   EKG:  EKG is ordered today.  The ekg ordered today demonstrates sinus rhythm HR 78, PVC  Recent Labs: No results found for requested labs within last 8760 hours.  Recent Lipid Panel    Component Value Date/Time   CHOL 160 12/07/2018 1317   TRIG 195 (H) 12/07/2018 1317   HDL 42 12/07/2018 1317   CHOLHDL 3.8 12/07/2018 1317   CHOLHDL 4.9 10/18/2018 0313   VLDL 22 10/18/2018 0313   LDLCALC 79 12/07/2018 1317    Physical Exam:    VS:  BP 110/72   Pulse 78   Temp 97.9 F (36.6 C)   Ht 5\' 11"  (1.803 m)   Wt 187 lb 12.8 oz (85.2 kg)   SpO2 96%   BMI 26.19 kg/m     Wt Readings from Last 3 Encounters:  09/12/20 187 lb  12.8 oz (85.2 kg)  02/28/20 188 lb (85.3 kg)  02/14/20 186 lb 3.2 oz (84.5 kg)     GEN: elderly male in no acute distress HEENT: Normal NECK: No JVD; No carotid bruits LYMPHATICS: No lymphadenopathy CARDIAC: RRR, 4/6 systolic murmur RESPIRATORY:  Clear to auscultation without rales, wheezing or rhonchi  ABDOMEN: Soft, non-tender, non-distended  MUSCULOSKELETAL:  No edema; No deformity, left leg brace in place for foot drop SKIN: Warm and dry NEUROLOGIC:  Alert and oriented x 3 PSYCHIATRIC:  Normal affect   ASSESSMENT:    1. S/P CABG x 5   2. Benign essential HTN   3. Cerebrovascular accident (CVA) due to thrombosis of precerebral artery (HCC)   4. Stage 2 chronic kidney disease   5. Hyperlipidemia with target LDL less than 70    PLAN:    In order of problems listed above:  CAD s/p CABG x 5 1999 - continue ASA, BB - no angina    Hyperlipidemia with LDL goal < 70 - continue 5 mg crestor  - LDL in 2018 was 79 - will check lipid profile at next provider visit when fasting   Moderate aortic stenosis - murmur on exam - echo reviewed in 01/2020 - no symptoms - will repeat echo next year   Hx of CVA - continue ASA, statin   Follow up with Zachary Spence in 6 months. Repeat echo at that time.    Medication Adjustments/Labs and Tests Ordered: Current medicines are reviewed at length with the patient today.  Concerns regarding medicines are outlined above.  Orders Placed This Encounter  Procedures  . EKG 12-Lead   Meds ordered this encounter  Medications  . metoprolol succinate (TOPROL XL) 25 MG 24 hr tablet    Sig: Take 0.5 tablets (12.5 mg total) by mouth daily.    Dispense:  45 tablet    Refill:  1  . DISCONTD: rosuvastatin (CRESTOR) 5 MG tablet    Sig: Take 1 tablet (5 mg total) by mouth daily at 6 PM.    Dispense:  90 tablet    Refill:  1  . rosuvastatin (CRESTOR) 5 MG tablet    Sig: Take 1 tablet (5 mg total) by mouth daily at 6 PM.    Dispense:  90 tablet      Refill:  1    Signed, Ledora Bottcher, [place]  09/12/2020 9:48 AM    [place]

## 2020-09-11 DIAGNOSIS — H353114 Nonexudative age-related macular degeneration, right eye, advanced atrophic with subfoveal involvement: Secondary | ICD-10-CM | POA: Diagnosis not present

## 2020-09-11 DIAGNOSIS — H353122 Nonexudative age-related macular degeneration, left eye, intermediate dry stage: Secondary | ICD-10-CM | POA: Diagnosis not present

## 2020-09-11 DIAGNOSIS — H35033 Hypertensive retinopathy, bilateral: Secondary | ICD-10-CM | POA: Diagnosis not present

## 2020-09-11 DIAGNOSIS — E119 Type 2 diabetes mellitus without complications: Secondary | ICD-10-CM | POA: Diagnosis not present

## 2020-09-12 ENCOUNTER — Other Ambulatory Visit: Payer: Self-pay

## 2020-09-12 ENCOUNTER — Encounter: Payer: Self-pay | Admitting: Physician Assistant

## 2020-09-12 ENCOUNTER — Ambulatory Visit: Payer: Medicare Other | Admitting: Physician Assistant

## 2020-09-12 VITALS — BP 110/72 | HR 78 | Temp 97.9°F | Ht 71.0 in | Wt 187.8 lb

## 2020-09-12 DIAGNOSIS — Z951 Presence of aortocoronary bypass graft: Secondary | ICD-10-CM

## 2020-09-12 DIAGNOSIS — I10 Essential (primary) hypertension: Secondary | ICD-10-CM | POA: Diagnosis not present

## 2020-09-12 DIAGNOSIS — I6300 Cerebral infarction due to thrombosis of unspecified precerebral artery: Secondary | ICD-10-CM

## 2020-09-12 DIAGNOSIS — N182 Chronic kidney disease, stage 2 (mild): Secondary | ICD-10-CM | POA: Diagnosis not present

## 2020-09-12 DIAGNOSIS — E785 Hyperlipidemia, unspecified: Secondary | ICD-10-CM

## 2020-09-12 MED ORDER — METOPROLOL SUCCINATE ER 25 MG PO TB24
12.5000 mg | ORAL_TABLET | Freq: Every day | ORAL | 1 refills | Status: DC
Start: 2020-09-12 — End: 2021-04-17

## 2020-09-12 MED ORDER — ROSUVASTATIN CALCIUM 5 MG PO TABS: 5.0000 mg | ORAL_TABLET | Freq: Every day | ORAL | 1 refills | Status: DC

## 2020-09-12 NOTE — Patient Instructions (Signed)
Medication Instructions:  Your physician recommends that you continue on your current medications as directed. Please refer to the Current Medication list given to you today.  *If you need a refill on your cardiac medications before your next appointment, please call your pharmacy*   Follow-Up: At [facility], you and your health needs are our priority.  As part of our continuing mission to provide you with exceptional heart care, we have created designated Provider Care Teams.  These Care Teams include your primary Cardiologist (physician) and Advanced Practice Providers (APPs -  Physician Assistants and Nurse Practitioners) who all work together to provide you with the care you need, when you need it.  We recommend signing up for the patient portal called "MyChart".  Sign up information is provided on this After Visit Summary.  MyChart is used to connect with patients for Virtual Visits (Telemedicine).  Patients are able to view lab/test results, encounter notes, upcoming appointments, etc.  Non-urgent messages can be sent to your provider as well.   To learn more about what you can do with MyChart, go to [email or web address].    Your next appointment:   6 month(s)  The format for your next appointment:   In Person  Provider:   You may see Shelva Majestic, MD or one of the following Advanced Practice Providers on your designated Care Team:    Almyra Deforest, PA-C  Fabian Sharp, PA-C or   Roby Lofts, [place]    Other Instructions Please call 2 months in advance to schedule your follow-up appointment with Dr. Claiborne Billings.

## 2020-09-18 ENCOUNTER — Ambulatory Visit: Payer: Medicare Other

## 2020-09-25 ENCOUNTER — Ambulatory Visit: Payer: Medicare Other | Attending: Internal Medicine

## 2020-09-25 DIAGNOSIS — Z23 Encounter for immunization: Secondary | ICD-10-CM

## 2020-09-25 NOTE — Progress Notes (Signed)
   Covid-19 Vaccination Clinic  Name:  LILLIAN TIGGES    MRN: [id number] DOB: 02-13-1923  09/25/2020  Mr. Speciale was observed post Covid-19 immunization for 15 minutes without incident. He was provided with Vaccine Information Sheet and instruction to access the V-Safe system.   Mr. Steen was instructed to call 911 with any severe reactions post vaccine: Marland Kitchen Difficulty breathing  . Swelling of face and throat  . A fast heartbeat  . A bad rash all over body  . Dizziness and weakness

## 2020-11-09 ENCOUNTER — Encounter: Payer: Self-pay | Admitting: Podiatry

## 2020-11-09 ENCOUNTER — Ambulatory Visit: Payer: Medicare Other | Admitting: Podiatry

## 2020-11-09 ENCOUNTER — Other Ambulatory Visit: Payer: Self-pay

## 2020-11-09 DIAGNOSIS — M21371 Foot drop, right foot: Secondary | ICD-10-CM

## 2020-11-09 DIAGNOSIS — B351 Tinea unguium: Secondary | ICD-10-CM

## 2020-11-09 DIAGNOSIS — M79676 Pain in unspecified toe(s): Secondary | ICD-10-CM | POA: Diagnosis not present

## 2020-11-09 DIAGNOSIS — E119 Type 2 diabetes mellitus without complications: Secondary | ICD-10-CM | POA: Diagnosis not present

## 2020-11-09 DIAGNOSIS — N182 Chronic kidney disease, stage 2 (mild): Secondary | ICD-10-CM

## 2020-11-09 NOTE — Progress Notes (Signed)
This patient returns to my office for at risk foot care.  This patient requires this care by a professional since this patient will be at risk due to having diabetes type 2 and chronic kidney disease.  This patient is unable to cut nails himself since the patient cannot reach his nails.These nails are painful walking and wearing shoes.  Patient has self avulsed fourth and second  toenail right foot.  This patient presents for at risk foot care today. Patient presents to the office with his son.  General Appearance  Alert, conversant and in no acute stress.  Vascular  Dorsalis pedis and posterior tibial  pulses are  Weakly palpable  bilaterally.  Capillary return is within normal limits  bilaterally. Cold feet.  Neurologic  Senn-Weinstein monofilament wire test within normal limits  bilaterally. Muscle power within normal limits bilaterally.  Nails Thick disfigured discolored nails with subungual debris  from hallux to fifth toes bilaterally. No evidence of bacterial infection or drainage bilaterally.  Orthopedic  No limitations of motion  feet .  No crepitus or effusions noted.  No bony pathology or digital deformities noted.  Skin  normotropic skin with no porokeratosis noted bilaterally.  No signs of infections or ulcers noted.     Onychomycosis  Pain in right toes  Pain in left toes  Consent was obtained for treatment procedures.   Mechanical debridement of nails 1-5  bilaterally performed with a nail nipper.  Filed with dremel without incident.     Return office visit   3 months                   Told patient to return for periodic foot care and evaluation due to potential at risk complications.   Gardiner Barefoot DPM

## 2020-12-27 DIAGNOSIS — M25562 Pain in left knee: Secondary | ICD-10-CM | POA: Diagnosis not present

## 2020-12-27 DIAGNOSIS — M1712 Unilateral primary osteoarthritis, left knee: Secondary | ICD-10-CM | POA: Diagnosis not present

## 2020-12-31 DIAGNOSIS — E1169 Type 2 diabetes mellitus with other specified complication: Secondary | ICD-10-CM | POA: Diagnosis not present

## 2020-12-31 DIAGNOSIS — E1142 Type 2 diabetes mellitus with diabetic polyneuropathy: Secondary | ICD-10-CM | POA: Diagnosis not present

## 2020-12-31 DIAGNOSIS — E782 Mixed hyperlipidemia: Secondary | ICD-10-CM | POA: Diagnosis not present

## 2020-12-31 DIAGNOSIS — I2510 Atherosclerotic heart disease of native coronary artery without angina pectoris: Secondary | ICD-10-CM | POA: Diagnosis not present

## 2020-12-31 DIAGNOSIS — E1165 Type 2 diabetes mellitus with hyperglycemia: Secondary | ICD-10-CM | POA: Diagnosis not present

## 2021-01-24 DIAGNOSIS — E1142 Type 2 diabetes mellitus with diabetic polyneuropathy: Secondary | ICD-10-CM | POA: Diagnosis not present

## 2021-01-24 DIAGNOSIS — I350 Nonrheumatic aortic (valve) stenosis: Secondary | ICD-10-CM | POA: Diagnosis not present

## 2021-01-24 DIAGNOSIS — K5790 Diverticulosis of intestine, part unspecified, without perforation or abscess without bleeding: Secondary | ICD-10-CM | POA: Diagnosis not present

## 2021-01-24 DIAGNOSIS — R0989 Other specified symptoms and signs involving the circulatory and respiratory systems: Secondary | ICD-10-CM | POA: Diagnosis not present

## 2021-01-24 DIAGNOSIS — E782 Mixed hyperlipidemia: Secondary | ICD-10-CM | POA: Diagnosis not present

## 2021-01-24 DIAGNOSIS — I2510 Atherosclerotic heart disease of native coronary artery without angina pectoris: Secondary | ICD-10-CM | POA: Diagnosis not present

## 2021-01-24 DIAGNOSIS — M21372 Foot drop, left foot: Secondary | ICD-10-CM | POA: Diagnosis not present

## 2021-01-24 DIAGNOSIS — D692 Other nonthrombocytopenic purpura: Secondary | ICD-10-CM | POA: Diagnosis not present

## 2021-01-24 DIAGNOSIS — Z0000 Encounter for general adult medical examination without abnormal findings: Secondary | ICD-10-CM | POA: Diagnosis not present

## 2021-01-31 DIAGNOSIS — E119 Type 2 diabetes mellitus without complications: Secondary | ICD-10-CM | POA: Diagnosis not present

## 2021-01-31 DIAGNOSIS — E1142 Type 2 diabetes mellitus with diabetic polyneuropathy: Secondary | ICD-10-CM | POA: Diagnosis not present

## 2021-01-31 DIAGNOSIS — I739 Peripheral vascular disease, unspecified: Secondary | ICD-10-CM | POA: Diagnosis not present

## 2021-02-13 DIAGNOSIS — L821 Other seborrheic keratosis: Secondary | ICD-10-CM | POA: Diagnosis not present

## 2021-02-13 DIAGNOSIS — L570 Actinic keratosis: Secondary | ICD-10-CM | POA: Diagnosis not present

## 2021-02-13 DIAGNOSIS — D225 Melanocytic nevi of trunk: Secondary | ICD-10-CM | POA: Diagnosis not present

## 2021-02-13 DIAGNOSIS — D0461 Carcinoma in situ of skin of right upper limb, including shoulder: Secondary | ICD-10-CM | POA: Diagnosis not present

## 2021-02-13 DIAGNOSIS — D1801 Hemangioma of skin and subcutaneous tissue: Secondary | ICD-10-CM | POA: Diagnosis not present

## 2021-02-15 ENCOUNTER — Encounter: Payer: Self-pay | Admitting: Podiatry

## 2021-02-15 ENCOUNTER — Ambulatory Visit (INDEPENDENT_AMBULATORY_CARE_PROVIDER_SITE_OTHER): Payer: Medicare Other | Admitting: Podiatry

## 2021-02-15 ENCOUNTER — Other Ambulatory Visit: Payer: Self-pay

## 2021-02-15 DIAGNOSIS — E119 Type 2 diabetes mellitus without complications: Secondary | ICD-10-CM

## 2021-02-15 DIAGNOSIS — B351 Tinea unguium: Secondary | ICD-10-CM

## 2021-02-15 DIAGNOSIS — N182 Chronic kidney disease, stage 2 (mild): Secondary | ICD-10-CM

## 2021-02-15 DIAGNOSIS — M79676 Pain in unspecified toe(s): Secondary | ICD-10-CM | POA: Diagnosis not present

## 2021-02-15 NOTE — Progress Notes (Signed)
This patient returns to my office for at risk foot care.  This patient requires this care by a professional since this patient will be at risk due to having diabetes type 2 and chronic kidney disease.  This patient is unable to cut nails himself since the patient cannot reach his nails.These nails are painful walking and wearing shoes. .  This patient presents for at risk foot care today. Patient presents to the office with his son.  General Appearance  Alert, conversant and in no acute stress.  Vascular  Dorsalis pedis and posterior tibial  pulses are  Weakly palpable  bilaterally.  Capillary return is within normal limits  bilaterally. Cold feet.  Neurologic  Senn-Weinstein monofilament wire test within normal limits  bilaterally. Muscle power within normal limits right.  Dropfoot left.  Nails Thick disfigured discolored nails with subungual debris  from hallux to fifth toes bilaterally. No evidence of bacterial infection or drainage bilaterally.  Orthopedic  No limitations of motion  feet .  No crepitus or effusions noted.  No bony pathology or digital deformities noted.  Skin  normotropic skin with no porokeratosis noted bilaterally.  No signs of infections or ulcers noted.     Onychomycosis  Pain in right toes  Pain in left toes  Consent was obtained for treatment procedures.   Mechanical debridement of nails 1-5  bilaterally performed with a nail nipper.  Filed with dremel without incident.     Return office visit   3 months                   Told patient to return for periodic foot care and evaluation due to potential at risk complications.   Gardiner Barefoot DPM

## 2021-03-21 DIAGNOSIS — E119 Type 2 diabetes mellitus without complications: Secondary | ICD-10-CM | POA: Diagnosis not present

## 2021-03-21 DIAGNOSIS — H353122 Nonexudative age-related macular degeneration, left eye, intermediate dry stage: Secondary | ICD-10-CM | POA: Diagnosis not present

## 2021-03-21 DIAGNOSIS — H353114 Nonexudative age-related macular degeneration, right eye, advanced atrophic with subfoveal involvement: Secondary | ICD-10-CM | POA: Diagnosis not present

## 2021-03-21 DIAGNOSIS — H179 Unspecified corneal scar and opacity: Secondary | ICD-10-CM | POA: Diagnosis not present

## 2021-03-21 DIAGNOSIS — H524 Presbyopia: Secondary | ICD-10-CM | POA: Diagnosis not present

## 2021-04-08 ENCOUNTER — Encounter: Payer: Self-pay | Admitting: Cardiovascular Disease

## 2021-04-08 ENCOUNTER — Other Ambulatory Visit: Payer: Self-pay

## 2021-04-08 ENCOUNTER — Ambulatory Visit: Payer: Medicare Other | Admitting: Cardiovascular Disease

## 2021-04-08 VITALS — BP 116/68 | HR 79 | Ht 71.0 in | Wt 184.4 lb

## 2021-04-08 DIAGNOSIS — I350 Nonrheumatic aortic (valve) stenosis: Secondary | ICD-10-CM

## 2021-04-08 DIAGNOSIS — I2510 Atherosclerotic heart disease of native coronary artery without angina pectoris: Secondary | ICD-10-CM | POA: Diagnosis not present

## 2021-04-08 DIAGNOSIS — Z951 Presence of aortocoronary bypass graft: Secondary | ICD-10-CM

## 2021-04-08 DIAGNOSIS — E785 Hyperlipidemia, unspecified: Secondary | ICD-10-CM

## 2021-04-08 DIAGNOSIS — I6381 Other cerebral infarction due to occlusion or stenosis of small artery: Secondary | ICD-10-CM | POA: Diagnosis not present

## 2021-04-08 NOTE — Progress Notes (Signed)
Cardiology Office Note    Date:  04/09/2021   ID:  Zachary Spence, DOB 04-19-1923, MRN [id number]  PCP:  Merrilee Seashore, MD  Cardiologist:  Shelva Majestic, MD   66-monthF/U cardiology evaluation  History of Present Illness:  WNICHOALS HEYDEis a 87y.o. male who established cardiology care with me in August 2020.  I last saw him in F11-Feb-2021  He presents for a 275-monthollow-up evaluation.  [name] known coronary artery disease and underwent CABG revascularization surgery x5 in 191999-02-11 He has been followed [facility]is [facility].  I do not have any of the records from [facility].  Mr. scales has a history of type 2 diabetes mellitus as well as hyperlipidemia.  He suffered a lacunar stroke in October 2019 and an MRI at that time showed a small focus of acute ischemia within the posterior left corona radiata.  He also had findings of chronic small vessel ischemia including multiple old lacunar infarcts.  He is recovering well with minimal weakness on the right side.  He has had some mild neuropathy.  He has left foot drop and continues to wear a foot brace.  [place]  He had undergone an echo Doppler study in October 2019 which showed an EF of 55 to 60% and there was evidence for mild left ventricular hypertrophy.  His aortic valve was thickened and there was evidence for mild aortic stenosis with a mean gradient of 13 and peak gradient 24.  There was evidence for mitral annular calcification.  He [facility] When I initially saw him he denied  any episodes of chest pain or tightness, presyncope or syncope.  He was unaware of any significant dyspnea.    During his initial evaluation with me, physical examination disclosed a 2/6 systolic murmur compatible with his previously documented aortic valve stenosis.  I recommended a follow-up echo  Doppler evaluation which was completed on February 06, 2020.  This revealed EF 55 to 60% with mild LVH and grade 1 diastolic dysfunction.  His aortic valve was tricuspid.  There was mild aortic insufficiency.  It was suspected most likely that he had moderate aortic stenosis although his mean gradient was only 15 mm with a peak gradient of 28.7.  Aortic valve area by VTI was 1.25 cm.  I saw Mr. Skills on February 14, 2020 and over the prior 6 months he remained stable without chest pain or shortness of breath.  [place] died in 2011-Feb-2007nd is now living with his son [name] He uses a cane to walk outside and a walker to walk at his home.  He does have left foot drop for which he uses a left foot brace.  He continues to be on Toprol-XL 12.5 mg daily.  He is on rosuvastatin 5 mg.  He is diabetic on glipizide 5 mg.    Presently, he continues to feel well.  He exercises 5 days/week and typically goes to the gym where he walks on a treadmill and does some resistance exercises with barbells.  He denies exertional dyspnea.  He admits to occasional ankle swelling.  He is unaware of palpitations, presyncope or syncope.  He presents for follow-up evaluation.  Past Medical History:  Diagnosis Date  . Bowel obstruction ([place]  .  Diabetes mellitus without complication ([place])   . Hypertension   . Non Hodgkin's lymphoma ([place])   . Stroke [facility])     Past Surgical History:  Procedure Laterality Date  . APPENDECTOMY    . CORONARY ARTERY BYPASS GRAFT    . SPLENECTOMY      Current Medications: Outpatient Medications Prior to Visit  Medication Sig Dispense Refill  . acetaminophen (TYLENOL) 325 MG tablet Take 2 tablets (650 mg total) by mouth every 4 (four) hours as needed for mild pain (or temp > 37.5 C (99.5 F)).    Marland Kitchen Apoaequorin (PREVAGEN) 10 MG CAPS Take by mouth.    . ASPIRIN 81 PO Take by mouth.    . calcium carbonate (OS-CAL - DOSED IN MG OF ELEMENTAL  CALCIUM) 1250 (500 Ca) MG tablet Take 1 tablet (1,250 mg total) by mouth daily with breakfast. 30 tablet 1  . folic acid (FOLVITE) 1 MG tablet Take 1 tablet (1 mg total) by mouth daily. 30 tablet 1  . gentamicin cream (GARAMYCIN) 0.1 % Apply 1 application topically 2 (two) times daily. 15 g 1  . glipiZIDE (GLUCOTROL) 5 MG tablet Take 1 tablet (5 mg total) by mouth daily. 30 tablet 1  . metoprolol succinate (TOPROL XL) 25 MG 24 hr tablet Take 0.5 tablets (12.5 mg total) by mouth daily. 45 tablet 1  . Multiple Vitamin (MULTIVITAMIN WITH MINERALS) TABS tablet Take 1 tablet by mouth daily.    . Multiple Vitamins-Minerals (PRESERVISION AREDS 2 PO) Take 1 capsule by mouth daily.     . Omega-3 Fatty Acids (FISH OIL) 1000 MG CAPS Take 1,000 mg by mouth daily.     . rosuvastatin (CRESTOR) 5 MG tablet Take 1 tablet (5 mg total) by mouth daily at 6 PM. 90 tablet 1  . vitamin B-12 (CYANOCOBALAMIN) 500 MCG tablet Take 500 mcg by mouth daily.     No facility-administered medications prior to visit.     Allergies:   Erythromycin   Social History   Socioeconomic History  . Marital status: Widowed    Spouse name: Not on file  . Number of children: Not on file  . Years of education: Not on file  . Highest education level: Not on file  Occupational History  . Not on file  Tobacco Use  . Smoking status: Former Research scientist (life sciences)  . Smokeless tobacco: Never Used  Substance and Sexual Activity  . Alcohol use: No    Alcohol/week: 0.0 standard drinks  . Drug use: Never  . Sexual activity: Not on file  Other Topics Concern  . Not on file  Social History Narrative  . Not on file   Social Determinants of Health   Financial Resource Strain: Not on file  Food Insecurity: Not on file  Transportation Needs: Not on file  Physical Activity: Not on file  Stress: Not on file  Social Connections: Not on file    Social history is notable in that he was born in [place] and raised in [place].  He has been in [place] since 1941.  He had worked for [employer] for 37 years.  He retired at age 2-1/2.  He is widowed since 2007.  He had 3 children, 1 son died with an MI at age 37, a daughter died at age 22, and he currently lives with his remaining son.  Family History: Family history is notable that his parents are deceased.  ROS General: Negative; No fevers, chills, or night sweats;  HEENT: Negative;  No changes in vision or hearing, sinus congestion, difficulty swallowing Pulmonary: Negative; No cough, wheezing, shortness of breath, hemoptysis Cardiovascular: Negative; No chest pain, presyncope, syncope, palpitations GI: Negative; No nausea, vomiting, diarrhea, or abdominal pain GU: Negative; No dysuria, hematuria, or difficulty voiding Musculoskeletal: Negative; no myalgias, joint pain, or weakness Hematologic/Oncology: Status post splenectomy Endocrine: Negative; no heat/cold intolerance; no diabetes Neuro: Prior CVA in 2019 and documentation of several lacunar infarcts. Skin: Negative; No rashes or skin lesions Psychiatric: Negative; No behavioral problems, depression Sleep: Negative; No snoring, daytime sleepiness, hypersomnolence, bruxism, restless legs, hypnogognic hallucinations, no cataplexy Other comprehensive 14 point system review is negative.   PHYSICAL EXAM:   VS:  BP 116/68 (BP Location: Right Arm, Patient Position: Sitting, Cuff Size: Normal)   Pulse 79   Ht _0  (1.803 m)   Wt 184 lb 6.4 oz (83.6 kg)   SpO2 95%   BMI 25.72 kg/m     Repeat blood pressure by me was 118/70  Wt Readings from Last 3 Encounters:  04/08/21 184 lb 6.4 oz (83.6 kg)  09/12/20 187 lb 12.8 oz (85.2 kg)  02/28/20 188 lb (85.3 kg)      Physical Exam BP 116/68 (BP Location: Right Arm, Patient Position: Sitting, Cuff Size: Normal)   Pulse 79   Ht _1  (1.803 m)   Wt 184 lb 6.4 oz (83.6 kg)   SpO2 95%   BMI 25.72 kg/m  General: Alert, oriented, no distress.  Skin: normal  turgor, no rashes, warm and dry HEENT: Normocephalic, atraumatic. Pupils equal round and reactive to light; sclera anicteric; extraocular muscles intact; Fundi ** Nose without nasal septal hypertrophy Mouth/Parynx benign; Mallinpatti scale Neck: No JVD, no carotid bruits; normal carotid upstroke Lungs: clear to ausculatation and percussion; no wheezing or rales Chest wall: without tenderness to palpitation Heart: PMI not displaced, RRR, s1 s2 normal, 2/6 systolic murmur the aortic area consistent with aortic stenosis, no diastolic murmur, no rubs, gallops, thrills, or heaves Abdomen: soft, nontender; no hepatosplenomehaly, BS+; abdominal aorta nontender and not dilated by palpation. Back: no CVA tenderness Pulses 2+ Musculoskeletal: full range of motion, normal strength, no joint deformities Extremities: no clubbing cyanosis or edema, Homan's sign negative  Neurologic: grossly nonfocal; Cranial nerves grossly wnl Psychologic: Normal mood and affect   Studies/Labs Reviewed:   EKG:  EKG is ordered today.  ECG (independently read by me): NSR at 79, no ectopy, normal intervals     February 14, 2020 ECG (independently read by me): Normal sinus rhythm at 86 bpm with an isolated PAC and PVC.  QTc interval 449 ms  August 17, 2019 ECG (independently read by me): NSR at 87; early transition; normal intervals  Recent Labs: BMP Latest Ref Rng & Units 10/20/2018 10/18/2018 10/18/2018  Glucose 70 - 99 mg/dL 132(H) 94 -  BUN 8 - 23 mg/dL 17 21 -  Creatinine 0.61 - 1.24 mg/dL 1.12 1.06 1.12  Sodium 135 - 145 mmol/L 140 141 -  Potassium 3.5 - 5.1 mmol/L 4.0 3.8 -  Chloride 98 - 111 mmol/L 102 109 -  CO2 22 - 32 mmol/L 29 22 -  Calcium 8.9 - 10.3 mg/dL 9.4 8.9 -     Hepatic Function Latest Ref Rng & Units 10/20/2018 10/18/2018 10/17/2018  Total Protein 6.5 - 8.1 g/dL 7.1 6.3(L) 7.1  Albumin 3.5 - 5.0 g/dL 3.9 3.7 4.0  AST 15 - 41 U/L _2 ALT 0 - 44 U/L _3 Alk Phosphatase  38 -  126 U/L 59 48 53  Total Bilirubin 0.3 - 1.2 mg/dL 1.2 0.9 0.6    CBC Latest Ref Rng & Units 10/20/2018 10/18/2018 10/18/2018  WBC 4.0 - 10.5 K/uL 9.3 8.1 8.1  Hemoglobin 13.0 - 17.0 g/dL 15.2 13.8 14.4  Hematocrit 39.0 - 52.0 % 47.3 43.4 44.7  Platelets 150 - 400 K/uL 299 281 283   Lab Results  Component Value Date   MCV 95.2 10/20/2018   MCV 96.7 10/18/2018   MCV 95.1 10/18/2018   No results found for: TSH Lab Results  Component Value Date   HGBA1C 6.5 (H) 10/18/2018     BNP No results found for: BNP  ProBNP No results found for: PROBNP   Lipid Panel     Component Value Date/Time   CHOL 160 12/07/2018 1317   TRIG 195 (H) 12/07/2018 1317   HDL 42 12/07/2018 1317   CHOLHDL 3.8 12/07/2018 1317   CHOLHDL 4.9 10/18/2018 0313   VLDL 22 10/18/2018 0313   LDLCALC 79 12/07/2018 1317     RADIOLOGY: No results found.   Additional studies/ records that were reviewed today include:  I reviewed the patient's records from [facility] on March 10, 2019.  His echo Doppler study from October 17, 2018 was reviewed.  I reviewed records of Dr. Leonie Man.   ASSESSMENT:    1. CAD in native artery   2. S/P CABG x 5   3. Nonrheumatic aortic valve stenosis   4. Hyperlipidemia with target LDL less than 70   5. H/O Lacunar stroke [facility])    PLAN:  Mr. Dalton Molesworth is a young appearing 87 year old gentleman who underwent CABG revascularization surgery x5 in 1999 at [facility].  At that time, he had undergone a treadmill test which was abnormal leading to catheterization demonstration of multivessel CAD.  He was unaware of ever suffering a myocardial infarction.  He saw Dr. Chilton Greathouse in follow-up for his cardiovascular care but when he moved to live with his son in [place] he established all his care in [employer] and sees Dr. Ashby Dawes for primary care and me for cardiology care.  He remains asymptomatic without chest pain PND orthopnea, exertional dyspnea, palpitations,  presyncope or syncope.  He walks with a cane.  However he still manages to exercise 5 days/week.  He goes to the gym and does walk on a treadmill and does some resistance exercises.  His blood pressure today remained stable and his pulse regular on low-dose metoprolol succinate 12.5 mg daily.  He is on rosuvastatin 5 mg for hyperlipidemia.  LDL cholesterol in December 2019 was 79.  Dr. Ashby Dawes has been checking his laboratory.  Physical exam discloses a murmur of aortic stenosis.  I again reviewed his prior echo Doppler assessments from 2019 and 2021.  I have recommended in 4-5 months he have a follow-up echo evaluation which will be approximately 18 months since his last echo Doppler study.  I discussed with him that there is associated with aortic stenosis and opened discussion in the event his AS has significantly progressed.  He is diabetic on glipizide.  We will see him in follow-up of his future echo Doppler study unless he notes a change in symptomatology at which time I will see him sooner   Medication Adjustments/Labs and Tests Ordered: Current medicines are reviewed at length with the patient today.  Concerns regarding medicines are outlined above.  Medication changes, Labs and Tests ordered today are listed in the Patient Instructions below.  Patient Instructions  Medication Instructions:    METOPROLOL SUCCINATE   If you need a refill on your cardiac medications before your next appointment, please call your pharmacy.   Lab work:     If you have labs (blood work) drawn today and your tests are completely normal, you will receive your results only by: Marland Kitchen MyChart Message (if you have MyChart) OR . A paper copy in the mail If you have any lab test that is abnormal or we need to change your treatment, we will call you to review the results.  Testing/Procedures:   Follow-Up: At [facility], you and your health needs are our priority.  As part of our continuing mission to provide  you with exceptional heart care, we have created designated Provider Care Teams.  These Care Teams include your primary Cardiologist (physician) and Advanced Practice Providers (APPs -  Physician Assistants and Nurse Practitioners) who all work together to provide you with the care you need, when you need it. .      Any Other Special Instructions Will Be Listed Below (If Applicable).    Signed, Shelva Majestic, MD  04/09/2021 5:07 PM    [facility] [address], [place], [place], [place]  [address] Phone: [phone]

## 2021-04-08 NOTE — Patient Instructions (Signed)
Medication Instructions:  Your physician recommends that you continue on your current medications as directed. Please refer to the Current Medication list given to you today.  *If you need a refill on your cardiac medications before your next appointment, please call your pharmacy*  Testing/Procedures: Your physician has requested that you have an echocardiogram in 6 MONTHS. Echocardiography is a painless test that uses sound waves to create images of your heart. It provides your doctor with information about the size and shape of your heart and how well your heart's chambers and valves are working. This procedure takes approximately one hour. There are no restrictions for this procedure.  This will be done at our [facility] location:  [place]: At [facility], you and your health needs are our priority.  As part of our continuing mission to provide you with exceptional heart care, we have created designated Provider Care Teams.  These Care Teams include your primary Cardiologist (physician) and Advanced Practice Providers (APPs -  Physician Assistants and Nurse Practitioners) who all work together to provide you with the care you need, when you need it.  We recommend signing up for the patient portal called "MyChart".  Sign up information is provided on this After Visit Summary.  MyChart is used to connect with patients for Virtual Visits (Telemedicine).  Patients are able to view lab/test results, encounter notes, upcoming appointments, etc.  Non-urgent messages can be sent to your provider as well.   To learn more about what you can do with MyChart, go to [email or web address].    Your next appointment:   6 month(s)  The format for your next appointment:   In Person  Provider:   Shelva Majestic, MD

## 2021-04-09 ENCOUNTER — Encounter: Payer: Self-pay | Admitting: Cardiovascular Disease

## 2021-04-09 ENCOUNTER — Telehealth: Payer: Self-pay | Admitting: Cardiovascular Disease

## 2021-04-09 NOTE — Telephone Encounter (Signed)
Spoke with patient regarding the Wednesday 10/02/21 1:00pm Echo appointment at [address], [address]---will mail information to patient and he voiced his understanding.

## 2021-04-12 ENCOUNTER — Other Ambulatory Visit: Payer: Self-pay

## 2021-04-12 MED ORDER — ROSUVASTATIN CALCIUM 5 MG PO TABS: 5.0000 mg | ORAL_TABLET | Freq: Every day | ORAL | 1 refills | Status: DC

## 2021-04-17 ENCOUNTER — Other Ambulatory Visit: Payer: Self-pay

## 2021-04-17 MED ORDER — METOPROLOL SUCCINATE ER 25 MG PO TB24: 12.5000 mg | ORAL_TABLET | Freq: Every day | ORAL | 1 refills | Status: DC

## 2021-05-02 ENCOUNTER — Other Ambulatory Visit: Payer: Self-pay

## 2021-05-02 ENCOUNTER — Encounter (INDEPENDENT_AMBULATORY_CARE_PROVIDER_SITE_OTHER): Payer: Medicare Other | Admitting: Ophthalmology

## 2021-05-02 DIAGNOSIS — H353132 Nonexudative age-related macular degeneration, bilateral, intermediate dry stage: Secondary | ICD-10-CM | POA: Diagnosis not present

## 2021-05-02 DIAGNOSIS — H353114 Nonexudative age-related macular degeneration, right eye, advanced atrophic with subfoveal involvement: Secondary | ICD-10-CM | POA: Diagnosis not present

## 2021-05-02 DIAGNOSIS — H43813 Vitreous degeneration, bilateral: Secondary | ICD-10-CM | POA: Diagnosis not present

## 2021-05-02 DIAGNOSIS — H353122 Nonexudative age-related macular degeneration, left eye, intermediate dry stage: Secondary | ICD-10-CM | POA: Diagnosis not present

## 2021-05-17 ENCOUNTER — Encounter: Payer: Self-pay | Admitting: Podiatry

## 2021-05-17 ENCOUNTER — Other Ambulatory Visit: Payer: Self-pay

## 2021-05-17 ENCOUNTER — Ambulatory Visit: Payer: Medicare Other | Admitting: Podiatry

## 2021-05-17 DIAGNOSIS — M79676 Pain in unspecified toe(s): Secondary | ICD-10-CM

## 2021-05-17 DIAGNOSIS — B351 Tinea unguium: Secondary | ICD-10-CM

## 2021-05-17 DIAGNOSIS — M21371 Foot drop, right foot: Secondary | ICD-10-CM

## 2021-05-17 DIAGNOSIS — L603 Nail dystrophy: Secondary | ICD-10-CM

## 2021-05-17 DIAGNOSIS — N182 Chronic kidney disease, stage 2 (mild): Secondary | ICD-10-CM

## 2021-05-17 DIAGNOSIS — E119 Type 2 diabetes mellitus without complications: Secondary | ICD-10-CM

## 2021-05-17 NOTE — Progress Notes (Signed)
This patient returns to my office for at risk foot care.  This patient requires this care by a professional since this patient will be at risk due to having diabetes type 2 and chronic kidney disease.  This patient is unable to cut nails himself since the patient cannot reach his nails.These nails are painful walking and wearing shoes. .  This patient presents for at risk foot care today. Patient presents to the office with his son.  General Appearance  Alert, conversant and in no acute stress.  Vascular  Dorsalis pedis and posterior tibial  pulses are  Weakly palpable  bilaterally.  Capillary return is within normal limits  bilaterally. Cold feet.  Neurologic  Senn-Weinstein monofilament wire test within normal limits  bilaterally. Muscle power within normal limits right.  Dropfoot left.  Nails Thick disfigured discolored nails with subungual debris  from hallux to fifth toes bilaterally. No evidence of bacterial infection or drainage bilaterally.  Orthopedic  No limitations of motion  feet .  No crepitus or effusions noted.  No bony pathology or digital deformities noted.  Skin  normotropic skin with no porokeratosis noted bilaterally.  No signs of infections or ulcers noted.     Onychomycosis  Pain in right toes  Pain in left toes  Consent was obtained for treatment procedures.   Mechanical debridement of nails 1-5  bilaterally performed with a nail nipper.  Filed with dremel without incident.     Return office visit   3 months                   Told patient to return for periodic foot care and evaluation due to potential at risk complications.   Gardiner Barefoot DPM

## 2021-05-27 ENCOUNTER — Encounter (INDEPENDENT_AMBULATORY_CARE_PROVIDER_SITE_OTHER): Payer: Medicare Other | Admitting: Ophthalmology

## 2021-05-27 ENCOUNTER — Other Ambulatory Visit: Payer: Self-pay

## 2021-05-27 DIAGNOSIS — H43813 Vitreous degeneration, bilateral: Secondary | ICD-10-CM | POA: Diagnosis not present

## 2021-05-27 DIAGNOSIS — H353132 Nonexudative age-related macular degeneration, bilateral, intermediate dry stage: Secondary | ICD-10-CM

## 2021-08-02 DIAGNOSIS — S80212A Abrasion, left knee, initial encounter: Secondary | ICD-10-CM | POA: Diagnosis not present

## 2021-08-02 DIAGNOSIS — W010XXA Fall on same level from slipping, tripping and stumbling without subsequent striking against object, initial encounter: Secondary | ICD-10-CM | POA: Diagnosis not present

## 2021-08-02 DIAGNOSIS — S61512A Laceration without foreign body of left wrist, initial encounter: Secondary | ICD-10-CM | POA: Diagnosis not present

## 2021-08-02 DIAGNOSIS — S91209A Unspecified open wound of unspecified toe(s) with damage to nail, initial encounter: Secondary | ICD-10-CM | POA: Diagnosis not present

## 2021-08-08 DIAGNOSIS — E1165 Type 2 diabetes mellitus with hyperglycemia: Secondary | ICD-10-CM | POA: Diagnosis not present

## 2021-08-08 DIAGNOSIS — E782 Mixed hyperlipidemia: Secondary | ICD-10-CM | POA: Diagnosis not present

## 2021-08-15 DIAGNOSIS — E1142 Type 2 diabetes mellitus with diabetic polyneuropathy: Secondary | ICD-10-CM | POA: Diagnosis not present

## 2021-08-15 DIAGNOSIS — I350 Nonrheumatic aortic (valve) stenosis: Secondary | ICD-10-CM | POA: Diagnosis not present

## 2021-08-15 DIAGNOSIS — D692 Other nonthrombocytopenic purpura: Secondary | ICD-10-CM | POA: Diagnosis not present

## 2021-08-15 DIAGNOSIS — E782 Mixed hyperlipidemia: Secondary | ICD-10-CM | POA: Diagnosis not present

## 2021-08-15 DIAGNOSIS — I2510 Atherosclerotic heart disease of native coronary artery without angina pectoris: Secondary | ICD-10-CM | POA: Diagnosis not present

## 2021-08-21 ENCOUNTER — Other Ambulatory Visit: Payer: Self-pay

## 2021-08-21 ENCOUNTER — Encounter: Payer: Self-pay | Admitting: Podiatry

## 2021-08-21 ENCOUNTER — Ambulatory Visit: Payer: Medicare Other | Admitting: Podiatry

## 2021-08-21 DIAGNOSIS — B351 Tinea unguium: Secondary | ICD-10-CM | POA: Diagnosis not present

## 2021-08-21 DIAGNOSIS — N182 Chronic kidney disease, stage 2 (mild): Secondary | ICD-10-CM | POA: Diagnosis not present

## 2021-08-21 DIAGNOSIS — E119 Type 2 diabetes mellitus without complications: Secondary | ICD-10-CM

## 2021-08-21 DIAGNOSIS — M79676 Pain in unspecified toe(s): Secondary | ICD-10-CM

## 2021-08-21 DIAGNOSIS — M21371 Foot drop, right foot: Secondary | ICD-10-CM

## 2021-08-21 NOTE — Progress Notes (Signed)
This patient returns to my office for at risk foot care.  This patient requires this care by a professional since this patient will be at risk due to having diabetes type 2 and chronic kidney disease.  This patient is unable to cut nails himself since the patient cannot reach his nails.These nails are painful walking and wearing shoes. .  This patient presents for at risk foot care today. Patient presents to the office with his son.  General Appearance  Alert, conversant and in no acute stress.  Vascular  Dorsalis pedis and posterior tibial  pulses are  Weakly palpable  bilaterally.  Capillary return is within normal limits  bilaterally. Cold feet.  Neurologic  Senn-Weinstein monofilament wire test within normal limits  bilaterally. Muscle power within normal limits right.  Dropfoot left.  Nails Thick disfigured discolored nails with subungual debris  from hallux to fifth toes bilaterally. No evidence of bacterial infection or drainage bilaterally.  Orthopedic  No limitations of motion  feet .  No crepitus or effusions noted.  No bony pathology or digital deformities noted.  Skin  normotropic skin with no porokeratosis noted bilaterally.  No signs of infections or ulcers noted.     Onychomycosis  Pain in right toes  Pain in left toes  Consent was obtained for treatment procedures.   Mechanical debridement of nails 1-5  bilaterally performed with a nail nipper.  Filed with dremel without incident.     Return office visit   3 months                   Told patient to return for periodic foot care and evaluation due to potential at risk complications.   Gardiner Barefoot DPM

## 2021-08-28 ENCOUNTER — Other Ambulatory Visit: Payer: Self-pay

## 2021-08-28 ENCOUNTER — Encounter (INDEPENDENT_AMBULATORY_CARE_PROVIDER_SITE_OTHER): Payer: Medicare Other | Admitting: Ophthalmology

## 2021-08-28 DIAGNOSIS — H353132 Nonexudative age-related macular degeneration, bilateral, intermediate dry stage: Secondary | ICD-10-CM | POA: Diagnosis not present

## 2021-08-28 DIAGNOSIS — H43813 Vitreous degeneration, bilateral: Secondary | ICD-10-CM

## 2021-09-10 ENCOUNTER — Other Ambulatory Visit: Payer: Self-pay

## 2021-09-10 MED ORDER — METOPROLOL SUCCINATE ER 25 MG PO TB24: 12.5000 mg | ORAL_TABLET | Freq: Every day | ORAL | 1 refills | Status: DC

## 2021-09-23 ENCOUNTER — Other Ambulatory Visit: Payer: Self-pay

## 2021-09-26 ENCOUNTER — Other Ambulatory Visit: Payer: Self-pay | Admitting: Cardiovascular Disease

## 2021-10-02 ENCOUNTER — Ambulatory Visit (HOSPITAL_COMMUNITY): Payer: Medicare Other | Attending: Cardiology

## 2021-10-02 ENCOUNTER — Other Ambulatory Visit: Payer: Self-pay

## 2021-10-02 DIAGNOSIS — I350 Nonrheumatic aortic (valve) stenosis: Secondary | ICD-10-CM | POA: Diagnosis not present

## 2021-10-02 LAB — ECHOCARDIOGRAM COMPLETE
AR max vel: 1.24 cm2
AV Area VTI: 1.27 cm2
AV Area mean vel: 1.16 cm2
AV Mean grad: 18.8 mmHg
AV Peak grad: 31.2 mmHg
Ao pk vel: 2.79 m/s
Area-P 1/2: 3.53 cm2
P 1/2 time: 410 msec
S' Lateral: 3.3 cm

## 2021-10-22 DIAGNOSIS — M79641 Pain in right hand: Secondary | ICD-10-CM | POA: Diagnosis not present

## 2021-11-13 ENCOUNTER — Other Ambulatory Visit: Payer: Self-pay

## 2021-11-13 MED ORDER — METOPROLOL SUCCINATE ER 25 MG PO TB24: 12.5000 mg | ORAL_TABLET | Freq: Every day | ORAL | 3 refills | Status: DC

## 2021-11-27 ENCOUNTER — Ambulatory Visit: Payer: Medicare Other | Admitting: Podiatry

## 2021-11-27 ENCOUNTER — Encounter: Payer: Self-pay | Admitting: Podiatry

## 2021-11-27 ENCOUNTER — Other Ambulatory Visit: Payer: Self-pay

## 2021-11-27 DIAGNOSIS — B351 Tinea unguium: Secondary | ICD-10-CM | POA: Diagnosis not present

## 2021-11-27 DIAGNOSIS — M79676 Pain in unspecified toe(s): Secondary | ICD-10-CM

## 2021-11-27 DIAGNOSIS — E119 Type 2 diabetes mellitus without complications: Secondary | ICD-10-CM

## 2021-11-27 DIAGNOSIS — M21371 Foot drop, right foot: Secondary | ICD-10-CM | POA: Diagnosis not present

## 2021-11-27 DIAGNOSIS — N182 Chronic kidney disease, stage 2 (mild): Secondary | ICD-10-CM

## 2021-11-27 NOTE — Progress Notes (Signed)
This patient returns to my office for at risk foot care.  This patient requires this care by a professional since this patient will be at risk due to having diabetes type 2 and chronic kidney disease.  This patient is unable to cut nails himself since the patient cannot reach his nails.These nails are painful walking and wearing shoes. .  This patient presents for at risk foot care today. Patient presents to the office with his son.  General Appearance  Alert, conversant and in no acute stress.  Vascular  Dorsalis pedis and posterior tibial  pulses are  weakly palpable  bilaterally.  Capillary return is within normal limits  bilaterally. Cold feet.  Absent digital hair.  Neurologic  Senn-Weinstein monofilament wire test within normal limits  bilaterally. Muscle power within normal limits right.  Dropfoot left.  Nails Thick disfigured discolored nails with subungual debris  from hallux to fifth toes bilaterally. No evidence of bacterial infection or drainage bilaterally.  Orthopedic  No limitations of motion  feet .  No crepitus or effusions noted.  No bony pathology or digital deformities noted.  Skin  normotropic skin with no porokeratosis noted bilaterally.  No signs of infections or ulcers noted.     Onychomycosis  Pain in right toes  Pain in left toes  Consent was obtained for treatment procedures.   Mechanical debridement of nails 1-5  bilaterally performed with a nail nipper.  Filed with dremel without incident.     Return office visit   3 months                   Told patient to return for periodic foot care and evaluation due to potential at risk complications.   Gardiner Barefoot DPM

## 2021-12-27 ENCOUNTER — Other Ambulatory Visit: Payer: Self-pay

## 2021-12-27 MED ORDER — METOPROLOL SUCCINATE ER 25 MG PO TB24: 12.5000 mg | ORAL_TABLET | Freq: Every day | ORAL | 3 refills | Status: DC

## 2021-12-30 ENCOUNTER — Telehealth: Payer: Self-pay | Admitting: Cardiovascular Disease

## 2021-12-30 NOTE — Telephone Encounter (Signed)
°*  STAT* If patient is at the pharmacy, call can be transferred to refill team.   1. Which medications need to be refilled? (please list name of each medication and dose if known)  metoprolol succinate (TOPROL XL) 25 MG 24 hr tablet  2. Which pharmacy/location (including street and city if local pharmacy) is medication to be sent to? [facility] ([place], [place]  3. Do they need a 30 day or 90 day supply? 90 with refills

## 2021-12-30 NOTE — Telephone Encounter (Signed)
°*  STAT* If patient is at the pharmacy, call can be transferred to refill team.   1. Which medications need to be refilled? (please list name of each medication and dose if known)  metoprolol succinate (TOPROL XL) 25 MG 24 hr tablet  2. Which pharmacy/location (including street and city if local pharmacy) is medication to be sent to? [id number] [place], [place] [address]  3. Do they need a 30 day or 90 day supply? 14 day supply    Patient is out of medication and needs a 14 day supply to last until he gets his rx in the mail

## 2021-12-31 ENCOUNTER — Other Ambulatory Visit: Payer: Self-pay

## 2021-12-31 MED ORDER — METOPROLOL SUCCINATE ER 25 MG PO TB24: 12.5000 mg | ORAL_TABLET | Freq: Every day | ORAL | 3 refills | Status: DC

## 2022-01-02 ENCOUNTER — Other Ambulatory Visit: Payer: Self-pay

## 2022-01-02 MED ORDER — METOPROLOL SUCCINATE ER 25 MG PO TB24: 12.5000 mg | ORAL_TABLET | Freq: Every day | ORAL | 3 refills | Status: DC

## 2022-01-02 NOTE — Telephone Encounter (Signed)
Patient's son calling back. He states neither pharmacy has received the refills. He says the patient still needs a 10 day [facility] [address], because he is out of the medication. He also needs it sent again to [facility] for a 90 day supply.

## 2022-01-30 DIAGNOSIS — R5383 Other fatigue: Secondary | ICD-10-CM | POA: Diagnosis not present

## 2022-01-30 DIAGNOSIS — E1142 Type 2 diabetes mellitus with diabetic polyneuropathy: Secondary | ICD-10-CM | POA: Diagnosis not present

## 2022-01-30 DIAGNOSIS — I2510 Atherosclerotic heart disease of native coronary artery without angina pectoris: Secondary | ICD-10-CM | POA: Diagnosis not present

## 2022-01-30 DIAGNOSIS — E782 Mixed hyperlipidemia: Secondary | ICD-10-CM | POA: Diagnosis not present

## 2022-01-30 DIAGNOSIS — I350 Nonrheumatic aortic (valve) stenosis: Secondary | ICD-10-CM | POA: Diagnosis not present

## 2022-01-30 DIAGNOSIS — D692 Other nonthrombocytopenic purpura: Secondary | ICD-10-CM | POA: Diagnosis not present

## 2022-02-06 DIAGNOSIS — I350 Nonrheumatic aortic (valve) stenosis: Secondary | ICD-10-CM | POA: Diagnosis not present

## 2022-02-06 DIAGNOSIS — D692 Other nonthrombocytopenic purpura: Secondary | ICD-10-CM | POA: Diagnosis not present

## 2022-02-06 DIAGNOSIS — E782 Mixed hyperlipidemia: Secondary | ICD-10-CM | POA: Diagnosis not present

## 2022-02-06 DIAGNOSIS — M21372 Foot drop, left foot: Secondary | ICD-10-CM | POA: Diagnosis not present

## 2022-02-06 DIAGNOSIS — Z0000 Encounter for general adult medical examination without abnormal findings: Secondary | ICD-10-CM | POA: Diagnosis not present

## 2022-02-06 DIAGNOSIS — K5790 Diverticulosis of intestine, part unspecified, without perforation or abscess without bleeding: Secondary | ICD-10-CM | POA: Diagnosis not present

## 2022-02-06 DIAGNOSIS — I2510 Atherosclerotic heart disease of native coronary artery without angina pectoris: Secondary | ICD-10-CM | POA: Diagnosis not present

## 2022-02-06 DIAGNOSIS — E1165 Type 2 diabetes mellitus with hyperglycemia: Secondary | ICD-10-CM | POA: Diagnosis not present

## 2022-02-06 DIAGNOSIS — C8590 Non-Hodgkin lymphoma, unspecified, unspecified site: Secondary | ICD-10-CM | POA: Diagnosis not present

## 2022-02-06 DIAGNOSIS — R2689 Other abnormalities of gait and mobility: Secondary | ICD-10-CM | POA: Diagnosis not present

## 2022-02-06 DIAGNOSIS — E1142 Type 2 diabetes mellitus with diabetic polyneuropathy: Secondary | ICD-10-CM | POA: Diagnosis not present

## 2022-02-08 ENCOUNTER — Emergency Department (HOSPITAL_COMMUNITY)
Admission: EM | Admit: 2022-02-08 | Discharge: 2022-02-09 | Disposition: A | Discharge status: Home / Self Care | Payer: Medicare Other | Attending: Emergency Medicine | Admitting: Emergency Medicine

## 2022-02-08 ENCOUNTER — Emergency Department (HOSPITAL_COMMUNITY): Payer: Medicare Other

## 2022-02-08 ENCOUNTER — Other Ambulatory Visit: Payer: Self-pay

## 2022-02-08 ENCOUNTER — Encounter (HOSPITAL_COMMUNITY): Payer: Self-pay

## 2022-02-08 DIAGNOSIS — Z7982 Long term (current) use of aspirin: Secondary | ICD-10-CM | POA: Insufficient documentation

## 2022-02-08 DIAGNOSIS — R9431 Abnormal electrocardiogram [ECG] [EKG]: Secondary | ICD-10-CM | POA: Diagnosis not present

## 2022-02-08 DIAGNOSIS — G9389 Other specified disorders of brain: Secondary | ICD-10-CM | POA: Diagnosis not present

## 2022-02-08 DIAGNOSIS — M50323 Other cervical disc degeneration at C6-C7 level: Secondary | ICD-10-CM | POA: Diagnosis not present

## 2022-02-08 DIAGNOSIS — C713 Malignant neoplasm of parietal lobe: Secondary | ICD-10-CM | POA: Diagnosis not present

## 2022-02-08 DIAGNOSIS — Z7901 Long term (current) use of anticoagulants: Secondary | ICD-10-CM | POA: Diagnosis not present

## 2022-02-08 DIAGNOSIS — S0990XA Unspecified injury of head, initial encounter: Secondary | ICD-10-CM | POA: Diagnosis not present

## 2022-02-08 DIAGNOSIS — M50322 Other cervical disc degeneration at C5-C6 level: Secondary | ICD-10-CM | POA: Diagnosis not present

## 2022-02-08 DIAGNOSIS — Z743 Need for continuous supervision: Secondary | ICD-10-CM | POA: Diagnosis not present

## 2022-02-08 DIAGNOSIS — M2578 Osteophyte, vertebrae: Secondary | ICD-10-CM | POA: Diagnosis not present

## 2022-02-08 DIAGNOSIS — Z8572 Personal history of non-Hodgkin lymphomas: Secondary | ICD-10-CM | POA: Diagnosis not present

## 2022-02-08 DIAGNOSIS — I6381 Other cerebral infarction due to occlusion or stenosis of small artery: Secondary | ICD-10-CM | POA: Diagnosis not present

## 2022-02-08 DIAGNOSIS — W19XXXA Unspecified fall, initial encounter: Secondary | ICD-10-CM

## 2022-02-08 DIAGNOSIS — S0181XA Laceration without foreign body of other part of head, initial encounter: Secondary | ICD-10-CM | POA: Diagnosis not present

## 2022-02-08 DIAGNOSIS — S0101XA Laceration without foreign body of scalp, initial encounter: Secondary | ICD-10-CM | POA: Insufficient documentation

## 2022-02-08 DIAGNOSIS — R930 Abnormal findings on diagnostic imaging of skull and head, not elsewhere classified: Secondary | ICD-10-CM | POA: Diagnosis not present

## 2022-02-08 DIAGNOSIS — W01190A Fall on same level from slipping, tripping and stumbling with subsequent striking against furniture, initial encounter: Secondary | ICD-10-CM | POA: Insufficient documentation

## 2022-02-08 DIAGNOSIS — R6889 Other general symptoms and signs: Secondary | ICD-10-CM | POA: Diagnosis not present

## 2022-02-08 DIAGNOSIS — Z23 Encounter for immunization: Secondary | ICD-10-CM | POA: Insufficient documentation

## 2022-02-08 DIAGNOSIS — M50321 Other cervical disc degeneration at C4-C5 level: Secondary | ICD-10-CM | POA: Diagnosis not present

## 2022-02-08 DIAGNOSIS — Z79899 Other long term (current) drug therapy: Secondary | ICD-10-CM | POA: Diagnosis not present

## 2022-02-08 MED ORDER — HYDROGEN PEROXIDE 3 % EX SOLN
CUTANEOUS | Status: AC
  Filled 2022-02-08: qty 473

## 2022-02-08 MED ORDER — TETANUS-DIPHTH-ACELL PERTUSSIS 5-2.5-18.5 LF-MCG/0.5 IM SUSY
0.5000 mL | PREFILLED_SYRINGE | Freq: Once | INTRAMUSCULAR | Status: AC
  Administered 2022-02-09: 0.5 mL via INTRAMUSCULAR
  Filled 2022-02-08: qty 0.5

## 2022-02-08 MED ORDER — LIDOCAINE-EPINEPHRINE 2 %-1:100000 IJ SOLN
20.0000 mL | Freq: Once | INTRAMUSCULAR | Status: AC
  Administered 2022-02-09: 20 mL
  Filled 2022-02-08: qty 1

## 2022-02-08 NOTE — ED Triage Notes (Signed)
Patient BIB [facility] from home. Lives with son. Was getting up to use the bathroom and tripped over and fell and hit the back of his head on the cabinet. Uses walked for ambulation. Bleeding controlled at this time. 5 inch gash on the back of his head. Minor cut on right shin. No LOC. Takes baby aspirin every day.

## 2022-02-08 NOTE — ED Provider Notes (Signed)
[facility] DEPT Provider Note   CSN: [id number] Arrival date & time: 02/08/22  2043     History  Chief Complaint  Patient presents with   Zachary Spence is a 88 y.o. male.  88 year old male with a past medical history of CVA, non-Hodgkin's lymphoma, diabetes and coronary artery disease status post CABG that presents to the emergency department today after a fall.  Patient states he remembers the whole fall and that he was walking with his walker and tripped over his own feet.  He fell backwards hitting his head on a desk in his room.  He suffered a laceration to his posterior scalp.  EMS was called and apparently had quite a bit of bleeding.  Dressing was placed and patient was brought here for further evaluation.  He is unsure on his tetanus shot.  He is pretty sure he did not syncopize and this was more of a mechanical fall.  Is on baby aspirin but no other blood thinners.  No neurologic changes since that time.  Patient able to ambulate at his baseline since the fall without any significant pain in his hips or legs.  Has a small small abrasion to his right anterior shin that is not bothering him at this time   Fall      Home Medications Prior to Admission medications   Medication Sig Start Date End Date Taking? Authorizing Provider  acetaminophen (TYLENOL) 325 MG tablet Take 2 tablets (650 mg total) by mouth every 4 (four) hours as needed for mild pain (or temp > 37.5 C (99.5 F)). 11/03/18   Angiulli, Lavon Paganini, PA-C  Apoaequorin (PREVAGEN) 10 MG CAPS Take by mouth.    [provider]  ASPIRIN 81 PO Take by mouth.    [provider]  calcium carbonate (OS-CAL - DOSED IN MG OF ELEMENTAL CALCIUM) 1250 (500 Ca) MG tablet Take 1 tablet (1,250 mg total) by mouth daily with breakfast. 11/03/18   Angiulli, Lavon Paganini, PA-C  folic acid (FOLVITE) 1 MG tablet Take 1 tablet (1 mg total) by mouth daily. 11/03/18   Angiulli, Lavon Paganini, PA-C   gentamicin cream (GARAMYCIN) 0.1 % Apply 1 application topically 2 (two) times daily. 05/18/19   Edrick Kins, DPM  glipiZIDE (GLUCOTROL) 5 MG tablet Take 1 tablet (5 mg total) by mouth daily. 11/03/18   Angiulli, Lavon Paganini, PA-C  metoprolol succinate (TOPROL XL) 25 MG 24 hr tablet Take 0.5 tablets (12.5 mg total) by mouth daily. 01/02/22   Troy Sine, MD  Multiple Vitamin (MULTIVITAMIN WITH MINERALS) TABS tablet Take 1 tablet by mouth daily.    [provider]  Multiple Vitamins-Minerals (PRESERVISION AREDS 2 PO) Take 1 capsule by mouth daily.     [provider]  Omega-3 Fatty Acids (FISH OIL) 1000 MG CAPS Take 1,000 mg by mouth daily.     [provider]  rosuvastatin (CRESTOR) 5 MG tablet TAKE 1 TABLET BY MOUTH  DAILY AT 6 PM. 09/26/21   Troy Sine, MD  vitamin B-12 (CYANOCOBALAMIN) 500 MCG tablet Take 500 mcg by mouth daily.    [provider]      Allergies    Erythromycin    Review of Systems   Review of Systems  Physical Exam Updated Vital Signs BP 112/67    Pulse 69    Temp 98.7 F (37.1 C) (Oral)    Resp 15    Ht 5\' 11"  (1.803 m)  Wt 84 kg    SpO2 94%    BMI 25.83 kg/m  Physical Exam Vitals and nursing note reviewed.  Constitutional:      Appearance: He is well-developed.  HENT:     Head: Normocephalic.     Comments: Posterior scalp laceration approximately 5 cm long, straight, hemostatic, 3 mm deep    Mouth/Throat:     Mouth: Mucous membranes are moist.     Pharynx: Oropharynx is clear.  Eyes:     Pupils: Pupils are equal, round, and reactive to light.  Cardiovascular:     Rate and Rhythm: Normal rate.  Pulmonary:     Effort: Pulmonary effort is normal. No respiratory distress.  Abdominal:     General: Abdomen is flat. There is no distension.  Musculoskeletal:        General: Normal range of motion.     Cervical back: Normal range of motion.  Skin:    General: Skin is warm and dry.     Comments: Abrasion to right  shin  Neurological:     General: No focal deficit present.     Mental Status: He is alert.    ED Results / Procedures / Treatments   Labs (all labs ordered are listed, but only abnormal results are displayed) Labs Reviewed - No data to display  EKG None  Radiology CT Head Wo Contrast  Result Date: 02/08/2022 CLINICAL DATA:  Neck trauma. Patient tripped and fell, hitting the back of his head on a cabinet. 5 inch laceration noted at the back of the head. EXAM: CT HEAD WITHOUT CONTRAST CT CERVICAL SPINE WITHOUT CONTRAST TECHNIQUE: Multidetector CT imaging of the head and cervical spine was performed following the standard protocol without intravenous contrast. Multiplanar CT image reconstructions of the cervical spine were also generated. RADIATION DOSE REDUCTION: This exam was performed according to the departmental dose-optimization program which includes automated exposure control, adjustment of the mA and/or kV according to patient size and/or use of iterative reconstruction technique. COMPARISON:  CT angio neck 10/18/2018, CT head 11/18/2015 FINDINGS: CT HEAD FINDINGS Brain: No acute intracranial hemorrhage, extra-axial fluid collection, or midline shift. No hydrocephalus. Increased hypodensity within the white matter of the right parietal lobe with concurrent loss of the sulci (axial image 21). Vascular: No hyperdense vessel or unexpected calcification. Skull: Normal. Negative for fracture or focal lesion. Sinuses/Orbits: No acute finding. Other: Partially visualized right anterior parotid mass measures 1.7 cm, stable compared to 11/18/2015. Soft tissue defect with trace subcutaneous emphysema is noted overlying the right parietal convexity. No subcutaneous hematoma. CT CERVICAL SPINE FINDINGS Alignment: Normal. Skull base and vertebrae: No acute fracture. No primary bone lesion or focal pathologic process. Soft tissues and spinal canal: No prevertebral fluid or swelling. No visible canal  hematoma. 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 Mild degenerative disc disease with anterior osteophytes at the C2-C3 and C3-C4 levels. Moderate degenerative disc disease at C4-C5 and severe degenerative disc disease at C5-C6 and C6-C7 with loss of disc space height and marginal osteophytes. Moderate to severe hypertrophic facet arthropathy, most severe at the left C2-C4 and right C4-C5 facet joints. Hypertrophic facet arthropathy, in combination with uncovertebral spurring results in mild left neural foraminal narrowing at C5-C6. Upper chest: Negative. Other: None. IMPRESSION: CT head: 1. No intracranial hemorrhage. 2. Increased hypodensity within the white matter of the right parietal lobe with concurrent loss of the adjacent sulci is concerning for possible infiltrative process. Recommend further evaluation with MRI. 3. Soft tissue defect  with trace underlying subcutaneous emphysema just right of midline over the parietal convexity. No subcutaneous hematoma. CT cervical spine: 1. No fracture or traumatic malalignment of the cervical spine. 2. Multilevel degenerative disc disease, most severe at the lower cervical levels. Electronically Signed   By: Ileana Roup M.D.   On: 02/08/2022 21:40   CT Cervical Spine Wo Contrast  Result Date: 02/08/2022 CLINICAL DATA:  Neck trauma. Patient tripped and fell, hitting the back of his head on a cabinet. 5 inch laceration noted at the back of the head. EXAM: CT HEAD WITHOUT CONTRAST CT CERVICAL SPINE WITHOUT CONTRAST TECHNIQUE: Multidetector CT imaging of the head and cervical spine was performed following the standard protocol without intravenous contrast. Multiplanar CT image reconstructions of the cervical spine were also generated. RADIATION DOSE REDUCTION: This exam was performed according to the departmental dose-optimization program which includes automated exposure control, adjustment of the mA and/or kV according to patient size and/or use  of iterative reconstruction technique. COMPARISON:  CT angio neck 10/18/2018, CT head 11/18/2015 FINDINGS: CT HEAD FINDINGS Brain: No acute intracranial hemorrhage, extra-axial fluid collection, or midline shift. No hydrocephalus. Increased hypodensity within the white matter of the right parietal lobe with concurrent loss of the sulci (axial image 21). Vascular: No hyperdense vessel or unexpected calcification. Skull: Normal. Negative for fracture or focal lesion. Sinuses/Orbits: No acute finding. Other: Partially visualized right anterior parotid mass measures 1.7 cm, stable compared to 11/18/2015. Soft tissue defect with trace subcutaneous emphysema is noted overlying the right parietal convexity. No subcutaneous hematoma. CT CERVICAL SPINE FINDINGS Alignment: Normal. Skull base and vertebrae: No acute fracture. No primary bone lesion or focal pathologic process. Soft tissues and spinal canal: No prevertebral fluid or swelling. No visible canal hematoma. 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 Mild degenerative disc disease with anterior osteophytes at the C2-C3 and C3-C4 levels. Moderate degenerative disc disease at C4-C5 and severe degenerative disc disease at C5-C6 and C6-C7 with loss of disc space height and marginal osteophytes. Moderate to severe hypertrophic facet arthropathy, most severe at the left C2-C4 and right C4-C5 facet joints. Hypertrophic facet arthropathy, in combination with uncovertebral spurring results in mild left neural foraminal narrowing at C5-C6. Upper chest: Negative. Other: None. IMPRESSION: CT head: 1. No intracranial hemorrhage. 2. Increased hypodensity within the white matter of the right parietal lobe with concurrent loss of the adjacent sulci is concerning for possible infiltrative process. Recommend further evaluation with MRI. 3. Soft tissue defect with trace underlying subcutaneous emphysema just right of midline over the parietal convexity. No  subcutaneous hematoma. CT cervical spine: 1. No fracture or traumatic malalignment of the cervical spine. 2. Multilevel degenerative disc disease, most severe at the lower cervical levels. Electronically Signed   By: Ileana Roup M.D.   On: 02/08/2022 21:40    Procedures .Marland KitchenLaceration Repair  Date/Time: 02/09/2022 2:55 AM Performed by: Merrily Pew, MD Authorized by: Merrily Pew, MD   Consent:    Consent obtained:  Verbal   Consent given by:  Patient   Risks discussed:  Infection, need for additional repair, nerve damage, poor wound healing, poor cosmetic result, pain, retained foreign body, tendon damage and vascular damage   Alternatives discussed:  No treatment, delayed treatment and observation Universal protocol:    Procedure explained and questions answered to patient or proxy's satisfaction: yes     Relevant documents present and verified: yes     Site/side marked: yes     Patient identity confirmed:  Hospital-assigned identification  number and arm band Anesthesia:    Anesthesia method:  Local infiltration   Local anesthetic:  Lidocaine 2% WITH epi Laceration details:    Location:  Scalp   Scalp location:  Crown   Length (cm):  5   Depth (mm):  4 Pre-procedure details:    Preparation:  Patient was prepped and draped in usual sterile fashion and imaging obtained to evaluate for foreign bodies Exploration:    Limited defect created (wound extended): no     Wound exploration: wound explored through full range of motion   Treatment:    Area cleansed with:  Saline   Amount of cleaning:  Extensive   Irrigation solution:  Sterile water   Irrigation method:  Syringe   Visualized foreign bodies/material removed: no     Debridement:  None   Undermining:  None Skin repair:    Repair method:  Sutures   Suture size:  5-0   Suture material:  Fast-absorbing gut   Suture technique:  Simple interrupted   Number of sutures:  5 Approximation:    Approximation:  Close Repair type:     Repair type:  Simple Post-procedure details:    Dressing:  Antibiotic ointment   Procedure completion:  Tolerated well, no immediate complications    Medications Ordered in ED Medications - No data to display  ED Course/ Medical Decision Making/ A&P                           Medical Decision Making Amount and/or Complexity of Data Reviewed Labs: ordered. Radiology: ordered.  Risk OTC drugs. Prescription drug management.   88 yo M w/ what sounds like a mechanical fall and laceration to posterior scalp (repaired as above). No other obvious injuries. CT head with concern for cerebral edema (D/w Dr. Jeannine Boga) and suggestign MRI. Patient will xfer to [facility] via [facility] for MRI. Ordered same. Discussed with physicians at [facility] (drs. Dykstra and Horton) regarding patient. Discussed findings and concerns with son as well.    Final Clinical Impression(s) / ED Diagnoses Final diagnoses:  None    Rx / DC Orders ED Discharge Orders     None         Daymen Hassebrock, Corene Cornea, MD 02/09/22 [id number]

## 2022-02-09 ENCOUNTER — Other Ambulatory Visit: Payer: Self-pay

## 2022-02-09 ENCOUNTER — Emergency Department (HOSPITAL_COMMUNITY): Payer: Medicare Other

## 2022-02-09 DIAGNOSIS — C713 Malignant neoplasm of parietal lobe: Secondary | ICD-10-CM | POA: Diagnosis not present

## 2022-02-09 DIAGNOSIS — I6381 Other cerebral infarction due to occlusion or stenosis of small artery: Secondary | ICD-10-CM | POA: Diagnosis not present

## 2022-02-09 DIAGNOSIS — R930 Abnormal findings on diagnostic imaging of skull and head, not elsewhere classified: Secondary | ICD-10-CM | POA: Diagnosis not present

## 2022-02-09 DIAGNOSIS — Z8572 Personal history of non-Hodgkin lymphomas: Secondary | ICD-10-CM | POA: Diagnosis not present

## 2022-02-09 LAB — I-STAT CHEM 8, ED
BUN: 20 mg/dL (ref 8–23)
Calcium, Ion: 1.12 mmol/L — ABNORMAL LOW (ref 1.15–1.40)
Chloride: 107 mmol/L (ref 98–111)
Creatinine, Ser: 1 mg/dL (ref 0.61–1.24)
Glucose, Bld: 127 mg/dL — ABNORMAL HIGH (ref 70–99)
HCT: 38 % — ABNORMAL LOW (ref 39.0–52.0)
Hemoglobin: 12.9 g/dL — ABNORMAL LOW (ref 13.0–17.0)
Potassium: 3.5 mmol/L (ref 3.5–5.1)
Sodium: 142 mmol/L (ref 135–145)
TCO2: 25 mmol/L (ref 22–32)

## 2022-02-09 MED ORDER — BACITRACIN ZINC 500 UNIT/GM EX OINT
TOPICAL_OINTMENT | Freq: Two times a day (BID) | CUTANEOUS | Status: DC
  Administered 2022-02-09: 1 via TOPICAL
  Filled 2022-02-09: qty 0.9

## 2022-02-09 MED ORDER — GADOBUTROL 1 MMOL/ML IV SOLN
8.0000 mL | Freq: Once | INTRAVENOUS | Status: AC | PRN
  Administered 2022-02-09: 8 mL via INTRAVENOUS

## 2022-02-09 NOTE — ED Notes (Signed)
Patient transported to MRI 

## 2022-02-09 NOTE — ED Triage Notes (Signed)
Pt transferred from [facility] for a MRI after falling at home. Pt received a CT at [facility].

## 2022-02-09 NOTE — ED Provider Notes (Signed)
Update note  88 year old gentleman presenting to the emergency department originally after fall, head trauma.  CT head and C-spine obtained at [facility].  Negative for acute traumatic pathology.  Radiologist commented on increased hypodensity in white matter of right parietal lobe recommending MRI for further evaluation.  Dr. Dayna Barker completed laceration repair and transferred here for MRI.  Assessed patient, discussed plan for MRI with pt and son, resting comfortably.  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 Consulted NSGY. Discussed in detail with Dr. Zada Finders on call today. He is concerned for very poor prognosis. Given this was an incidental finding and patient has no specific symptoms at this time, Dr. Zada Finders recommends close f/u in his clinic to discuss next steps with patient and recommends against admitting at this time. I discussed the findings in detail witih patient and son at bedside. 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 Patient denies any specific neuro symptoms at present, appears well in no distress. Patient and son agreeable to plan for close out pt f/u with NSGY. Encouraged pt also to f/u with his PCP. Vitals are stable at present and pt discharged home with son.    Lucrezia Starch, MD 02/10/22 [phone]

## 2022-02-09 NOTE — Discharge Instructions (Addendum)
You have absorbable sutures. These will come out on their own in 5-7 days. Please do not scrub the area for that time. Please see instructions for appropriate care.   The MRI showed a brain mass, likely aggressive.  You need to contact Dr. Barbra Sarks office on Monday morning to request first available appointment for close follow-up to discuss next steps.  If you develop any seizure activity, vomiting, headaches, numbness, weakness or other new concerning symptom, come back to ER for reassessment.

## 2022-02-10 ENCOUNTER — Telehealth: Payer: Self-pay | Admitting: *Deleted

## 2022-02-10 ENCOUNTER — Telehealth: Payer: Self-pay | Admitting: Internal Medicine

## 2022-02-10 NOTE — Telephone Encounter (Signed)
Scheduled appt per 2/20 staff msg from RN Mont Dutton. Pt's son is aware of appt date and time. Pt's son is aware to arrive 15 mins prior to appt time and to bring and updated insurance card. Pt's son is aware of appt location.

## 2022-02-10 NOTE — Telephone Encounter (Signed)
Called pt to sch appt per 2/20 staff msg from [place], no answer. Left msg for pt to call back to sch appt.

## 2022-02-10 NOTE — Telephone Encounter (Signed)
Pt son called regarding referral recommendations for his dad.  RNCM reached out to Karie Mainland as pt son states he spoke with her earlier concerning this matter.

## 2022-02-11 ENCOUNTER — Other Ambulatory Visit: Payer: Self-pay

## 2022-02-11 ENCOUNTER — Encounter: Payer: Self-pay | Admitting: Internal Medicine

## 2022-02-11 ENCOUNTER — Ambulatory Visit: Payer: Medicare Other | Admitting: Cardiovascular Disease

## 2022-02-11 ENCOUNTER — Inpatient Hospital Stay: Payer: Medicare Other | Attending: Internal Medicine | Admitting: Internal Medicine

## 2022-02-11 DIAGNOSIS — Z8673 Personal history of transient ischemic attack (TIA), and cerebral infarction without residual deficits: Secondary | ICD-10-CM | POA: Diagnosis not present

## 2022-02-11 DIAGNOSIS — G939 Disorder of brain, unspecified: Secondary | ICD-10-CM | POA: Insufficient documentation

## 2022-02-11 DIAGNOSIS — G9389 Other specified disorders of brain: Secondary | ICD-10-CM

## 2022-02-11 DIAGNOSIS — R296 Repeated falls: Secondary | ICD-10-CM | POA: Insufficient documentation

## 2022-02-11 NOTE — Progress Notes (Signed)
[facility] [place], [place] [address] [phone]   New Patient Evaluation  Date of Service: 02/11/22 Patient Name: Zachary Spence Patient MRN: [id number] Patient DOB: February 10, 1923 Provider: Ventura Sellers, MD  Identifying Statement:  Zachary Spence is a 88 y.o. male with right parietal  mass  who presents for initial consultation and evaluation.    Referring Provider: Merrilee Seashore, [address] [place],  [place] [address]  Oncologic History: Oncology History   No history exists.    Biomarkers:  MGMT Unknown.  IDH 1/2 Unknown.  EGFR Unknown  TERT Unknown   History of Present Illness: The patient's records from the referring physician were obtained and reviewed and the patient interviewed to confirm this HPI.  Zachary Spence presented to medical attention this past weekend, after fall at home led to trauma workup, head imaging.  MRI brain there demonstrated an enhancing and infiltrative brain mass, consistent with primary CNS neoplasm.  He was discharged to home after visiting with neurosurgery Sunday.  Although it's not clear what led to the initial fall, there was a second fall yesterday, which was from him "missing his chair" when sitting down.  His son, present today, describes impairments in short term memory, conversational recall, in recent weeks.  He is otherwise able to care for most of his own needs, currently walks with a walker.  Strongly denies any history of seizures.  Medications: Current Outpatient Medications on File Prior to Visit  Medication Sig Dispense Refill   acetaminophen (TYLENOL) 325 MG tablet Take 2 tablets (650 mg total) by mouth every 4 (four) hours as needed for mild pain (or temp > 37.5 C (99.5 F)).     Apoaequorin (PREVAGEN) 10 MG CAPS Take 10 mg by mouth 2 (two) times daily.     ASPIRIN 81 PO Take 81 mg by mouth daily.     calcium carbonate (OS-CAL - DOSED IN  MG OF ELEMENTAL CALCIUM) 1250 (500 Ca) MG tablet Take 1 tablet (1,250 mg total) by mouth daily with breakfast. 30 tablet 1   folic acid (FOLVITE) 1 MG tablet Take 1 tablet (1 mg total) by mouth daily. 30 tablet 1   glipiZIDE (GLUCOTROL) 5 MG tablet Take 1 tablet (5 mg total) by mouth daily. 30 tablet 1   metoprolol succinate (TOPROL XL) 25 MG 24 hr tablet Take 0.5 tablets (12.5 mg total) by mouth daily. 45 tablet 3   Multiple Vitamin (MULTIVITAMIN WITH MINERALS) TABS tablet Take 1 tablet by mouth daily.     Multiple Vitamins-Minerals (PRESERVISION AREDS 2 PO) Take 1 capsule by mouth 2 (two) times daily.     Omega-3 Fatty Acids (FISH OIL) 1000 MG CAPS Take 1,000 mg by mouth daily.      rosuvastatin (CRESTOR) 5 MG tablet TAKE 1 TABLET BY MOUTH  DAILY AT 6 PM. (Patient taking differently: Take 5 mg by mouth at bedtime.) 90 tablet 3   vitamin B-12 (CYANOCOBALAMIN) 500 MCG tablet Take 500 mcg by mouth daily.     No current facility-administered medications on file prior to visit.    Allergies:  Allergies  Allergen Reactions   Erythromycin Swelling    Swelling of hands   Past Medical History:  Past Medical History:  Diagnosis Date   Bowel obstruction (HCC)    Diabetes mellitus without complication ([facility])    Hypertension    Non Hodgkin's lymphoma ([facility])    Stroke ([facility])  Past Surgical History:  Past Surgical History:  Procedure Laterality Date   APPENDECTOMY     CORONARY ARTERY BYPASS GRAFT     SPLENECTOMY     Social History:  Social History   Socioeconomic History   Marital status: Widowed    Spouse name: Not on file   Number of children: Not on file   Years of education: Not on file   Highest education level: Not on file  Occupational History   Not on file  Tobacco Use   Smoking status: Former   Smokeless tobacco: Never  Substance and Sexual Activity   Alcohol use: No    Alcohol/week: 0.0 standard drinks   Drug use: Never   Sexual activity: Not on file  Other Topics  Concern   Not on file  Social History Narrative   Not on file   Social Determinants of Health   Financial Resource Strain: Not on file  Food Insecurity: Not on file  Transportation Needs: Not on file  Physical Activity: Not on file  Stress: Not on file  Social Connections: Not on file  Intimate Partner Violence: Not on file   Family History: No family history on file.  Review of Systems: Constitutional: Doesn't report fevers, chills or abnormal weight loss Eyes: Doesn't report blurriness of vision Ears, nose, mouth, throat, and face: Doesn't report sore throat Respiratory: Doesn't report cough, dyspnea or wheezes Cardiovascular: Doesn't report palpitation, chest discomfort  Gastrointestinal:  Doesn't report nausea, constipation, diarrhea GU: Doesn't report incontinence Skin: Doesn't report skin rashes Neurological: Per HPI Musculoskeletal: Doesn't report joint pain Behavioral/Psych: Doesn't report anxiety  Physical Exam: Vitals:   02/11/22 1335  BP: 140/70  Pulse: 77  Resp: 17  Temp: 98 F (36.7 C)  SpO2: 98%   KPS: 80. General: Alert, cooperative, pleasant, in no acute distress Head: Normal EENT: No conjunctival injection or scleral icterus.  Lungs: Resp effort normal Cardiac: Regular rate Abdomen: Non-distended abdomen Skin: No rashes cyanosis or petechiae. Extremities: No clubbing or edema  Neurologic Exam: Mental Status: Awake, alert, attentive to examiner. Oriented to self and environment. Language is fluent with intact comprehension.  Cranial Nerves: Visual acuity is grossly normal. Visual fields are full. Extra-ocular movements intact. No ptosis. Face is symmetric Motor: Tone and bulk are normal. Power is full in both arms and legs. Reflexes are symmetric, no pathologic reflexes present.  Sensory: Intact to light touch Gait: Orthopedic limitations   Labs: I have reviewed the data as listed    Component Value Date/Time   NA 142 02/09/2022 0356   NA  138 04/18/2015 1333   K 3.5 02/09/2022 0356   K 4.0 04/18/2015 1333   CL 107 02/09/2022 0356   CL 106 04/18/2015 1333   CO2 29 10/20/2018 0612   CO2 27 04/18/2015 1333   GLUCOSE 127 (H) 02/09/2022 0356   GLUCOSE 93 04/18/2015 1333   BUN 20 02/09/2022 0356   BUN 20 04/18/2015 1333   CREATININE 1.00 02/09/2022 0356   CREATININE 1.05 04/18/2015 1333   CALCIUM 9.4 10/20/2018 0612   CALCIUM 8.8 (L) 04/18/2015 1333   PROT 7.1 10/20/2018 0612   PROT 7.3 04/18/2015 1333   ALBUMIN 3.9 10/20/2018 0612   ALBUMIN 4.2 04/18/2015 1333   AST 23 10/20/2018 0612   AST 28 04/18/2015 1333   ALT 19 10/20/2018 0612   ALT 26 04/18/2015 1333   ALKPHOS 59 10/20/2018 0612   ALKPHOS 59 04/18/2015 1333   BILITOT 1.2 10/20/2018 0612   BILITOT  0.8 04/18/2015 1333   GFRNONAA 55 (L) 10/20/2018 0612   GFRNONAA >60 04/18/2015 1333   GFRAA >60 10/20/2018 0612   GFRAA >60 04/18/2015 1333   Lab Results  Component Value Date   WBC 9.3 10/20/2018   NEUTROABS 4.2 10/20/2018   HGB 12.9 (L) 02/09/2022   HCT 38.0 (L) 02/09/2022   MCV 95.2 10/20/2018   PLT 299 10/20/2018    Imaging: CT Head Wo Contrast  Result Date: 02/08/2022 CLINICAL DATA:  Neck trauma. Patient tripped and fell, hitting the back of his head on a cabinet. 5 inch laceration noted at the back of the head. EXAM: CT HEAD WITHOUT CONTRAST CT CERVICAL SPINE WITHOUT CONTRAST TECHNIQUE: Multidetector CT imaging of the head and cervical spine was performed following the standard protocol without intravenous contrast. Multiplanar CT image reconstructions of the cervical spine were also generated. RADIATION DOSE REDUCTION: This exam was performed according to the departmental dose-optimization program which includes automated exposure control, adjustment of the mA and/or kV according to patient size and/or use of iterative reconstruction technique. COMPARISON:  CT angio neck 10/18/2018, CT head 11/18/2015 FINDINGS: CT HEAD FINDINGS Brain: No acute  intracranial hemorrhage, extra-axial fluid collection, or midline shift. No hydrocephalus. Increased hypodensity within the white matter of the right parietal lobe with concurrent loss of the sulci (axial image 21). Vascular: No hyperdense vessel or unexpected calcification. Skull: Normal. Negative for fracture or focal lesion. Sinuses/Orbits: No acute finding. Other: Partially visualized right anterior parotid mass measures 1.7 cm, stable compared to 11/18/2015. Soft tissue defect with trace subcutaneous emphysema is noted overlying the right parietal convexity. No subcutaneous hematoma. CT CERVICAL SPINE FINDINGS Alignment: Normal. Skull base and vertebrae: No acute fracture. No primary bone lesion or focal pathologic process. Soft tissues and spinal canal: No prevertebral fluid or swelling. No visible canal hematoma. 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 Mild degenerative disc disease with anterior osteophytes at the C2-C3 and C3-C4 levels. Moderate degenerative disc disease at C4-C5 and severe degenerative disc disease at C5-C6 and C6-C7 with loss of disc space height and marginal osteophytes. Moderate to severe hypertrophic facet arthropathy, most severe at the left C2-C4 and right C4-C5 facet joints. Hypertrophic facet arthropathy, in combination with uncovertebral spurring results in mild left neural foraminal narrowing at C5-C6. Upper chest: Negative. Other: None. IMPRESSION: CT head: 1. No intracranial hemorrhage. 2. Increased hypodensity within the white matter of the right parietal lobe with concurrent loss of the adjacent sulci is concerning for possible infiltrative process. Recommend further evaluation with MRI. 3. Soft tissue defect with trace underlying subcutaneous emphysema just right of midline over the parietal convexity. No subcutaneous hematoma. CT cervical spine: 1. No fracture or traumatic malalignment of the cervical spine. 2. Multilevel degenerative disc disease,  most severe at the lower cervical levels. Electronically Signed   By: Ileana Roup M.D.   On: 02/08/2022 21:40   CT Cervical Spine Wo Contrast  Result Date: 02/08/2022 CLINICAL DATA:  Neck trauma. Patient tripped and fell, hitting the back of his head on a cabinet. 5 inch laceration noted at the back of the head. EXAM: CT HEAD WITHOUT CONTRAST CT CERVICAL SPINE WITHOUT CONTRAST TECHNIQUE: Multidetector CT imaging of the head and cervical spine was performed following the standard protocol without intravenous contrast. Multiplanar CT image reconstructions of the cervical spine were also generated. RADIATION DOSE REDUCTION: This exam was performed according to the departmental dose-optimization program which includes automated exposure control, adjustment of the mA and/or kV according to  patient size and/or use of iterative reconstruction technique. COMPARISON:  CT angio neck 10/18/2018, CT head 11/18/2015 FINDINGS: CT HEAD FINDINGS Brain: No acute intracranial hemorrhage, extra-axial fluid collection, or midline shift. No hydrocephalus. Increased hypodensity within the white matter of the right parietal lobe with concurrent loss of the sulci (axial image 21). Vascular: No hyperdense vessel or unexpected calcification. Skull: Normal. Negative for fracture or focal lesion. Sinuses/Orbits: No acute finding. Other: Partially visualized right anterior parotid mass measures 1.7 cm, stable compared to 11/18/2015. Soft tissue defect with trace subcutaneous emphysema is noted overlying the right parietal convexity. No subcutaneous hematoma. CT CERVICAL SPINE FINDINGS Alignment: Normal. Skull base and vertebrae: No acute fracture. No primary bone lesion or focal pathologic process. Soft tissues and spinal canal: No prevertebral fluid or swelling. No visible canal hematoma. 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 Mild degenerative disc disease with anterior osteophytes at the C2-C3 and C3-C4 levels.  Moderate degenerative disc disease at C4-C5 and severe degenerative disc disease at C5-C6 and C6-C7 with loss of disc space height and marginal osteophytes. Moderate to severe hypertrophic facet arthropathy, most severe at the left C2-C4 and right C4-C5 facet joints. Hypertrophic facet arthropathy, in combination with uncovertebral spurring results in mild left neural foraminal narrowing at C5-C6. Upper chest: Negative. Other: None. IMPRESSION: CT head: 1. No intracranial hemorrhage. 2. Increased hypodensity within the white matter of the right parietal lobe with concurrent loss of the adjacent sulci is concerning for possible infiltrative process. Recommend further evaluation with MRI. 3. Soft tissue defect with trace underlying subcutaneous emphysema just right of midline over the parietal convexity. No subcutaneous hematoma. CT cervical spine: 1. No fracture or traumatic malalignment of the cervical spine. 2. Multilevel degenerative disc disease, most severe at the lower cervical levels. Electronically Signed   By: Ileana Roup M.D.   On: 02/08/2022 21:40   MR Brain W and Wo Contrast  Result Date: 02/09/2022 CLINICAL DATA:  88 year old male with recent fall. Abnormal head CT suggesting cerebral edema and/or infiltration in the right parietal lobe. History of non-Hodgkin's lymphoma. EXAM: MRI HEAD WITHOUT AND WITH CONTRAST TECHNIQUE: Multiplanar, multiecho pulse sequences of the brain and surrounding structures were obtained without and with intravenous contrast. CONTRAST:  43m GADAVIST GADOBUTROL 1 MMOL/ML IV SOLN COMPARISON:  Head CT 02/08/2022. Prior brain MRI 10/17/2018. FINDINGS: Brain: Irregular, nodular, somewhat stellate and masslike area of enhancement in the posterior right hemisphere about the atrium of the right lateral ventricle (series 18, image 31) with additional vague and petechial enhancement in the medial right occipital lobe, tracking toward the right parietal lobe. See series 18, images 24  through 34 and series 19, image 8. All told, the enhancement encompasses an area of about 3.5 cm. However, a much larger area of abnormal T2 and FLAIR hyperintensity is present in the brain, new since 2019,. Subtotal involvement of the right parietal lobe which is expanded. Diffusion is largely facilitated throughout the abnormal areas. No areas of dark T2 or FLAIR signal in the affected regions. Extension across the splenium of the corpus callosum which is expanded (series 11, image 13), and confluent left periatrial involvement. But also fairly extensive abnormal T2 and FLAIR hyperintensity in both frontal lobes and left parietal lobe, with additional abnormal signal crossing the body of the corpus callosum. But no convincing abnormal enhancement outside of the right periatrial region described above. Developmental venous anomaly in the left cerebellum (normal variant). No dural thickening identified. Subsequent mild mass effect on the lateral  ventricles. No ventriculomegaly. No midline shift. Superimposed chronic lacunar infarcts of the left centrum semiovale and right cerebellum are stable since 2019. No definite cortical encephalomalacia or chronic cerebral blood products. No superimposed No restricted diffusion or evidence of acute infarction. Negative basilar cisterns, pituitary and cervicomedullary junction. Vascular: Major intracranial vascular flow voids are stable since 2019 with dominant left vertebral artery. The major dural venous sinuses are enhancing and appear to be patent. Skull and upper cervical spine: Visualized bone marrow signal is within normal limits. Negative for age visible cervical spine. Sinuses/Orbits: Stable, negative. Other: Mild chronic left mastoid effusion appears inconsequential. IMPRESSION: 1. 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 CNS lymphoma was also considered given the history of prior non-Hodgkin's lymphoma, but felt much less likely. 2. Mass effect on the ventricles, but no midline shift, ventriculomegaly, or impending herniation. 3. No other acute intracranial abnormality. Mild for age chronic ischemic disease is stable since 2019. Electronically Signed   By: Genevie Ann M.D.   On: 02/09/2022 06:20     Assessment/Plan Brain mass  We appreciate the opportunity to participate in the care of Zachary Spence.  He presents today with clinical and radiographic syndrome consistent with likely primary CNS neoplasm.  Suspicion is greatest for intermediate or high grade glioma given burden of enhancement and infiltration, though lymphoma falls into differential as well.  MRI from 2019 (stroke) did not demonstrate any sign of gliomatosis.  We reviewed potential role of diagnostic biopsy, and also discussed  goals of care given likely diagnoses.    He and his family will take the next few days to discuss goals of care and desired path forward.   Screening for potential clinical trials was performed and discussed using eligibility criteria for active protocols at [facility], loco-regional tertiary centers, as well as national database available on [email or web address].    The patient is not a candidate for a research protocol at this time due to  no pathology .   We spent twenty additional minutes teaching regarding the natural history, biology, and historical experience in the treatment of brain tumors. We then discussed in detail the current recommendations for therapy focusing on the mode of administration, mechanism of action, anticipated toxicities, and quality of life issues associated with this plan. We also provided teaching sheets for the patient to take home as an additional  resource.  All questions were answered. The patient knows to call the clinic with any problems, questions or concerns. No barriers to learning were detected.    The total time spent in the encounter was 60 minutes and more than 50% was on counseling and review of test results   Ventura Sellers, MD Medical Director of Neuro-Oncology [facility] [place] 02/11/22 1:31 PM

## 2022-02-12 ENCOUNTER — Telehealth: Payer: Self-pay

## 2022-02-12 NOTE — Telephone Encounter (Signed)
Called the patient's son back to let him know that a message was sent to Dr. Mickeal Skinner about scheduling the brain biopsy. I let him know that Dr. Mickeal Skinner will reach out to Dr. Zada Finders regarding this. He verbalized understanding and had no further questions or concerns.

## 2022-02-13 DIAGNOSIS — L821 Other seborrheic keratosis: Secondary | ICD-10-CM | POA: Diagnosis not present

## 2022-02-13 DIAGNOSIS — Z85828 Personal history of other malignant neoplasm of skin: Secondary | ICD-10-CM | POA: Diagnosis not present

## 2022-02-13 DIAGNOSIS — L84 Corns and callosities: Secondary | ICD-10-CM | POA: Diagnosis not present

## 2022-02-13 DIAGNOSIS — L720 Epidermal cyst: Secondary | ICD-10-CM | POA: Diagnosis not present

## 2022-02-14 ENCOUNTER — Other Ambulatory Visit: Payer: Self-pay | Admitting: Radiation Therapy

## 2022-02-14 ENCOUNTER — Telehealth: Payer: Self-pay

## 2022-02-14 NOTE — Telephone Encounter (Signed)
Called pt's son to advise that Dr. Mickeal Skinner has reached out to neurosurgeon to schedule BX.

## 2022-02-14 NOTE — Telephone Encounter (Signed)
Patient's son, [name], called to say that he and patient would like to move forward with the scheduling of the BX discussed at apt with Dr. Mickeal Skinner.   Routed to Dr. Mickeal Skinner and Shelle Iron RN

## 2022-02-17 ENCOUNTER — Inpatient Hospital Stay: Payer: Medicare Other

## 2022-02-18 ENCOUNTER — Telehealth: Payer: Self-pay | Admitting: *Deleted

## 2022-02-18 NOTE — Telephone Encounter (Signed)
Patient is scheduled for consultation with Dr Zada Finders on 02/19/2022

## 2022-02-19 ENCOUNTER — Other Ambulatory Visit: Payer: Self-pay | Admitting: Neurological Surgery

## 2022-02-19 ENCOUNTER — Other Ambulatory Visit (HOSPITAL_COMMUNITY): Payer: Self-pay | Admitting: Neurological Surgery

## 2022-02-19 DIAGNOSIS — D496 Neoplasm of unspecified behavior of brain: Secondary | ICD-10-CM | POA: Diagnosis not present

## 2022-02-20 ENCOUNTER — Other Ambulatory Visit: Payer: Self-pay | Admitting: Radiation Therapy

## 2022-02-21 ENCOUNTER — Other Ambulatory Visit: Payer: Self-pay

## 2022-02-21 ENCOUNTER — Ambulatory Visit (HOSPITAL_COMMUNITY)
Admission: RE | Admit: 2022-02-21 | Discharge: 2022-02-21 | Disposition: A | Discharge status: Home / Self Care | Payer: Medicare Other | Source: Ambulatory Visit | Attending: Neurological Surgery | Admitting: Neurological Surgery

## 2022-02-21 DIAGNOSIS — Z01818 Encounter for other preprocedural examination: Secondary | ICD-10-CM | POA: Diagnosis not present

## 2022-02-21 DIAGNOSIS — D496 Neoplasm of unspecified behavior of brain: Secondary | ICD-10-CM | POA: Diagnosis not present

## 2022-02-21 MED ORDER — IOHEXOL 350 MG/ML SOLN
75.0000 mL | Freq: Once | INTRAVENOUS | Status: AC | PRN
  Administered 2022-02-21: 75 mL via INTRAVENOUS

## 2022-02-24 ENCOUNTER — Other Ambulatory Visit: Payer: Self-pay

## 2022-02-24 ENCOUNTER — Encounter (INDEPENDENT_AMBULATORY_CARE_PROVIDER_SITE_OTHER): Payer: Medicare Other | Admitting: Ophthalmology

## 2022-02-24 DIAGNOSIS — I10 Essential (primary) hypertension: Secondary | ICD-10-CM

## 2022-02-24 DIAGNOSIS — H43813 Vitreous degeneration, bilateral: Secondary | ICD-10-CM | POA: Diagnosis not present

## 2022-02-24 DIAGNOSIS — H353132 Nonexudative age-related macular degeneration, bilateral, intermediate dry stage: Secondary | ICD-10-CM

## 2022-02-24 DIAGNOSIS — H35033 Hypertensive retinopathy, bilateral: Secondary | ICD-10-CM | POA: Diagnosis not present

## 2022-02-24 NOTE — Pre-Procedure Instructions (Addendum)
Surgical Instructions ? ? ? Your procedure is scheduled on Friday, March 10th. ? Report to [facility] Main Entrance "A" at 5:30 A.M., then check in with the Admitting office. ? Call this number if you have problems the morning of surgery: ? [phone] ? ? If you have any questions prior to your surgery date call [phone]: Open Monday-Friday 8am-4pm ? ? ? Remember: ? Do not eat after midnight the night before your surgery ? ?You may drink clear liquids until 4:30 a.m. the morning of your surgery.   ?Clear liquids allowed are: Water, Non-Citrus Juices (without pulp), Carbonated Beverages, Clear Tea, Black Coffee Only (NO MILK, CREAM OR POWDERED CREAMER of any kind), and Gatorade. ?  ? Take these medicines the morning of surgery with A SIP OF WATER  ?acetaminophen (TYLENOL)-as needed ?Nasal spray-as needed ? ?Be sure to take your night dose of metoprolol succinate (TOPROL XL) the night before surgery.  ? ?Follow your surgeon's instructions on when to stop Aspirin.  If no instructions were given by your surgeon then you will need to call the office to get those instructions.   ? ?As of today, STOP taking any Aleve, Naproxen, Ibuprofen, Motrin, Advil, Goody's, BC's, all herbal medications, fish oil, and all vitamins. ? ?WHAT DO I DO ABOUT MY DIABETES MEDICATION? ? ? ?Do not take glipiZIDE (GLUCOTROL) the morning of surgery. If you take it at night, please DO NOT take your evening dose the night before surgery. ? ? ?HOW TO MANAGE YOUR DIABETES ?BEFORE AND AFTER SURGERY ? ?Why is it important to control my blood sugar before and after surgery? ?Improving blood sugar levels before and after surgery helps healing and can limit problems. ?A way of improving blood sugar control is eating a healthy diet by: ? Eating less sugar and carbohydrates ? Increasing activity/exercise ? Talking with your doctor about reaching your blood sugar goals ?High blood sugars (greater than 180 mg/dL) can raise your risk of infections and  slow your recovery, so you will need to focus on controlling your diabetes during the weeks before surgery. ?Make sure that the doctor who takes care of your diabetes knows about your planned surgery including the date and location. ? ?How do I manage my blood sugar before surgery? ?Check your blood sugar at least 4 times a day, starting 2 days before surgery, to make sure that the level is not too high or low. ? ?Check your blood sugar the morning of your surgery when you wake up and every 2 hours until you get to the Short Stay unit. ? ?If your blood sugar is less than 70 mg/dL, you will need to treat for low blood sugar: ?Do not take insulin. ?Treat a low blood sugar (less than 70 mg/dL) with ? cup of clear juice (cranberry or apple), 4 glucose tablets, OR glucose gel. ?Recheck blood sugar in 15 minutes after treatment (to make sure it is greater than 70 mg/dL). If your blood sugar is not greater than 70 mg/dL on recheck, call [phone] for further instructions. ?Report your blood sugar to the short stay nurse when you get to Short Stay. ? ?If you are admitted to the hospital after surgery: ?Your blood sugar will be checked by the staff and you will probably be given insulin after surgery (instead of oral diabetes medicines) to make sure you have good blood sugar levels. ?The goal for blood sugar control after surgery is 80-180 mg/dL. ? ?         ?           ?  Do NOT Smoke (Tobacco/Vaping) for 24 hours prior to your procedure. ? ?If you use a CPAP at night, you may bring your mask/headgear for your overnight stay. ?  ?Contacts, glasses, piercing's, hearing aid's, dentures or partials may not be worn into surgery, please bring cases for these belongings.  ?  ?For patients admitted to the hospital, discharge time will be determined by your treatment team. ?  ?Patients discharged the day of surgery will not be allowed to drive home, and someone needs to stay with them for 24 hours. ? ?NO VISITORS WILL BE ALLOWED  IN PRE-OP WHERE PATIENTS ARE PREPPED FOR SURGERY.  ONLY 1 SUPPORT PERSON MAY BE PRESENT IN THE WAITING ROOM WHILE YOU ARE IN SURGERY.  IF YOU ARE TO BE ADMITTED, ONCE YOU ARE IN YOUR ROOM YOU WILL BE ALLOWED TWO (2) VISITORS. (1) VISITOR MAY STAY OVERNIGHT BUT MUST ARRIVE TO THE ROOM BY 8pm.  Minor children may have two parents present. Special consideration for safety and communication needs will be reviewed on a case by case basis. ? ? ?Special instructions:   ?[place]- Preparing For Surgery ? ?Before surgery, you can play an important role. Because skin is not sterile, your skin needs to be as free of germs as possible. You can reduce the number of germs on your skin by washing with CHG (chlorahexidine gluconate) Soap before surgery.  CHG is an antiseptic cleaner which kills germs and bonds with the skin to continue killing germs even after washing.   ? ?Oral Hygiene is also important to reduce your risk of infection.  Remember - BRUSH YOUR TEETH THE MORNING OF SURGERY WITH YOUR REGULAR TOOTHPASTE ? ?Please do not use if you have an allergy to CHG or antibacterial soaps. If your skin becomes reddened/irritated stop using the CHG.  ?Do not shave (including legs and underarms) for at least 48 hours prior to first CHG shower. It is OK to shave your face. ? ?Please follow these instructions carefully. ?  ?Shower the NIGHT BEFORE SURGERY and the MORNING OF SURGERY ? ?If you chose to wash your hair, wash your hair first as usual with your normal shampoo. ? ?After you shampoo, rinse your hair and body thoroughly to remove the shampoo. ? ?Use CHG Soap as you would any other liquid soap. You can apply CHG directly to the skin and wash gently with a scrungie or a clean washcloth.  ? ?Apply the CHG Soap to your body ONLY FROM THE NECK DOWN.  Do not use on open wounds or open sores. Avoid contact with your eyes, ears, mouth and genitals (private parts). Wash Face and genitals (private parts)  with your normal soap.   ? ?Wash thoroughly, paying special attention to the area where your surgery will be performed. ? ?Thoroughly rinse your body with warm water from the neck down. ? ?DO NOT shower/wash with your normal soap after using and rinsing off the CHG Soap. ? ?Pat yourself dry with a CLEAN TOWEL. ? ?Wear CLEAN PAJAMAS to bed the night before surgery ? ?Place CLEAN SHEETS on your bed the night before your surgery ? ?DO NOT SLEEP WITH PETS. ? ? ?Day of Surgery: ?Take a shower with CHG soap. ?Do not wear jewelry. ?Do not wear lotions, powders, colognes, or deodorant. ?Men may shave face and neck. ?Do not bring valuables to the hospital.  ?[place] is not responsible for any belongings or valuables. ?Wear Clean/Comfortable clothing the morning of surgery ?Do not apply any deodorants/lotions.   ?  Remember to brush your teeth WITH YOUR REGULAR TOOTHPASTE. ?  ?Please read over the following fact sheets that you were given. ? ? ?3 days prior to your procedure or After your COVID test ? ?? You are not required to quarantine however you are required to wear a well-fitting mask when you are out and around people not in your household. If your mask becomes wet or soiled, replace with a new one. ? ?? Wash your hands often with soap and water for 20 seconds or clean your hands with an alcohol-based hand sanitizer that contains at least 60% alcohol. ? ?? Do not share personal items. ? ?? Notify your provider: ? ?o if you are in close contact with someone who has COVID ? ?o or if you develop a fever of 100.4 or greater, sneezing, cough, sore throat, shortness of breath or body aches.  ? ?

## 2022-02-25 ENCOUNTER — Encounter (HOSPITAL_COMMUNITY): Payer: Self-pay

## 2022-02-25 ENCOUNTER — Encounter (HOSPITAL_COMMUNITY)
Admission: RE | Admit: 2022-02-25 | Discharge: 2022-02-25 | Disposition: A | Discharge status: Home / Self Care | Payer: Medicare Other | Source: Ambulatory Visit | Attending: Neurological Surgery | Admitting: Neurological Surgery

## 2022-02-25 VITALS — BP 112/63 | HR 77 | Temp 97.5°F | Resp 17 | Ht 71.0 in | Wt 178.0 lb

## 2022-02-25 DIAGNOSIS — Z01812 Encounter for preprocedural laboratory examination: Secondary | ICD-10-CM | POA: Insufficient documentation

## 2022-02-25 DIAGNOSIS — B351 Tinea unguium: Secondary | ICD-10-CM | POA: Diagnosis not present

## 2022-02-25 DIAGNOSIS — C714 Malignant neoplasm of occipital lobe: Secondary | ICD-10-CM | POA: Diagnosis not present

## 2022-02-25 DIAGNOSIS — Z20822 Contact with and (suspected) exposure to covid-19: Secondary | ICD-10-CM | POA: Diagnosis not present

## 2022-02-25 DIAGNOSIS — H9190 Unspecified hearing loss, unspecified ear: Secondary | ICD-10-CM | POA: Diagnosis not present

## 2022-02-25 DIAGNOSIS — G9389 Other specified disorders of brain: Secondary | ICD-10-CM | POA: Insufficient documentation

## 2022-02-25 DIAGNOSIS — E119 Type 2 diabetes mellitus without complications: Secondary | ICD-10-CM

## 2022-02-25 DIAGNOSIS — E785 Hyperlipidemia, unspecified: Secondary | ICD-10-CM | POA: Insufficient documentation

## 2022-02-25 DIAGNOSIS — Z881 Allergy status to other antibiotic agents status: Secondary | ICD-10-CM | POA: Diagnosis not present

## 2022-02-25 DIAGNOSIS — I10 Essential (primary) hypertension: Secondary | ICD-10-CM | POA: Diagnosis not present

## 2022-02-25 DIAGNOSIS — I350 Nonrheumatic aortic (valve) stenosis: Secondary | ICD-10-CM | POA: Insufficient documentation

## 2022-02-25 DIAGNOSIS — I2510 Atherosclerotic heart disease of native coronary artery without angina pectoris: Secondary | ICD-10-CM | POA: Insufficient documentation

## 2022-02-25 DIAGNOSIS — Z01818 Encounter for other preprocedural examination: Secondary | ICD-10-CM

## 2022-02-25 DIAGNOSIS — Z87891 Personal history of nicotine dependence: Secondary | ICD-10-CM | POA: Diagnosis not present

## 2022-02-25 DIAGNOSIS — M79676 Pain in unspecified toe(s): Secondary | ICD-10-CM | POA: Diagnosis not present

## 2022-02-25 DIAGNOSIS — W19XXXA Unspecified fall, initial encounter: Secondary | ICD-10-CM | POA: Insufficient documentation

## 2022-02-25 DIAGNOSIS — Z8673 Personal history of transient ischemic attack (TIA), and cerebral infarction without residual deficits: Secondary | ICD-10-CM | POA: Insufficient documentation

## 2022-02-25 DIAGNOSIS — Z951 Presence of aortocoronary bypass graft: Secondary | ICD-10-CM | POA: Diagnosis not present

## 2022-02-25 DIAGNOSIS — Z8572 Personal history of non-Hodgkin lymphomas: Secondary | ICD-10-CM | POA: Diagnosis not present

## 2022-02-25 DIAGNOSIS — I252 Old myocardial infarction: Secondary | ICD-10-CM | POA: Diagnosis not present

## 2022-02-25 LAB — TYPE AND SCREEN
ABO/RH(D): O POS
Antibody Screen: NEGATIVE

## 2022-02-25 LAB — CBC
HCT: 43.8 % (ref 39.0–52.0)
Hemoglobin: 14.1 g/dL (ref 13.0–17.0)
MCH: 31.2 pg (ref 26.0–34.0)
MCHC: 32.2 g/dL (ref 30.0–36.0)
MCV: 96.9 fL (ref 80.0–100.0)
Platelets: 303 10*3/uL (ref 150–400)
RBC: 4.52 MIL/uL (ref 4.22–5.81)
RDW: 14.5 % (ref 11.5–15.5)
WBC: 8.6 10*3/uL (ref 4.0–10.5)
nRBC: 0 % (ref 0.0–0.2)

## 2022-02-25 LAB — HEMOGLOBIN A1C
Hgb A1c MFr Bld: 6.7 % — ABNORMAL HIGH (ref 4.8–5.6)
Mean Plasma Glucose: 145.59 mg/dL

## 2022-02-25 LAB — SARS CORONAVIRUS 2 (TAT 6-24 HRS): SARS Coronavirus 2: NEGATIVE

## 2022-02-25 LAB — BASIC METABOLIC PANEL
Anion gap: 8 (ref 5–15)
BUN: 23 mg/dL (ref 8–23)
CO2: 26 mmol/L (ref 22–32)
Calcium: 9.4 mg/dL (ref 8.9–10.3)
Chloride: 103 mmol/L (ref 98–111)
Creatinine, Ser: 1.28 mg/dL — ABNORMAL HIGH (ref 0.61–1.24)
GFR, Estimated: 51 mL/min — ABNORMAL LOW (ref >60)
Glucose, Bld: 116 mg/dL — ABNORMAL HIGH (ref 70–99)
Potassium: 4.3 mmol/L (ref 3.5–5.1)
Sodium: 137 mmol/L (ref 135–145)

## 2022-02-25 LAB — GLUCOSE, CAPILLARY: Glucose-Capillary: 151 mg/dL — ABNORMAL HIGH (ref 70–99)

## 2022-02-25 NOTE — Progress Notes (Signed)
PCP - Dr. Roselle Locus ?Cardiologist - Dr. Corky Downs ? ?PPM/ICD - n/a ? ?Chest x-ray - n/a ?EKG - 02/08/22 ?Stress Test - 2015 ?ECHO - 10/02/21 ?Cardiac Cath - denies ? ?Sleep Study - denies ?CPAP - denies ? ?Pt does not check CBG at home ?CBG at PAT 151. Will collect A1C today ? ?Blood Thinner Instructions: n/a ?Aspirin Instructions: LD 02/18/22 ? ?ERAS Protcol -Clear liquids until 0430 ?PRE-SURGERY Ensure or G2- None ordered ? ?COVID TEST- 02/25/22 done in PAT ? ?Anesthesia review: Yes, hx of CAD ? ?Patient denies shortness of breath, fever, cough and chest pain at PAT appointment ? ? ?All instructions explained to the patient, with a verbal understanding of the material. Patient agrees to go over the instructions while at home for a better understanding. Patient also instructed to self quarantine after being tested for COVID-19. The opportunity to ask questions was provided. ? ? ?

## 2022-02-26 ENCOUNTER — Ambulatory Visit: Payer: Medicare Other | Admitting: Podiatry

## 2022-02-26 ENCOUNTER — Encounter: Payer: Self-pay | Admitting: Podiatry

## 2022-02-26 ENCOUNTER — Other Ambulatory Visit: Payer: Self-pay

## 2022-02-26 DIAGNOSIS — E119 Type 2 diabetes mellitus without complications: Secondary | ICD-10-CM | POA: Diagnosis not present

## 2022-02-26 DIAGNOSIS — B351 Tinea unguium: Secondary | ICD-10-CM | POA: Diagnosis not present

## 2022-02-26 DIAGNOSIS — M21371 Foot drop, right foot: Secondary | ICD-10-CM

## 2022-02-26 DIAGNOSIS — M79676 Pain in unspecified toe(s): Secondary | ICD-10-CM | POA: Diagnosis not present

## 2022-02-26 DIAGNOSIS — N182 Chronic kidney disease, stage 2 (mild): Secondary | ICD-10-CM

## 2022-02-26 NOTE — Progress Notes (Signed)
This patient returns to my office for at risk foot care.  This patient requires this care by a professional since this patient will be at risk due to having diabetes type 2 and chronic kidney disease.  This patient is unable to cut nails himself since the patient cannot reach his nails.These nails are painful walking and wearing shoes. .  This patient presents for at risk foot care today. Patient presents to the office with his son. ? ?General Appearance  Alert, conversant and in no acute stress. ? ?Vascular  Dorsalis pedis and posterior tibial  pulses are  weakly palpable  bilaterally.  Capillary return is within normal limits  bilaterally. Cold feet.  Absent digital hair. ? ?Neurologic  Senn-Weinstein monofilament wire test within normal limits  bilaterally. Muscle power within normal limits right.  Dropfoot left. ? ?Nails Thick disfigured discolored nails with subungual debris  from hallux to fifth toes bilaterally. No evidence of bacterial infection or drainage bilaterally. ? ?Orthopedic  No limitations of motion  feet .  No crepitus or effusions noted.  No bony pathology or digital deformities noted. ? ?Skin  normotropic skin with no porokeratosis noted bilaterally.  No signs of infections or ulcers noted.    ? ?Onychomycosis  Pain in right toes  Pain in left toes ? ?Consent was obtained for treatment procedures.   Mechanical debridement of nails 1-5  bilaterally performed with a nail nipper.  Filed with dremel without incident.  ?  ? ?Return office visit   3 months                   Told patient to return for periodic foot care and evaluation due to potential at risk complications. ? ? ?Gardiner Barefoot DPM  ?

## 2022-02-26 NOTE — Progress Notes (Signed)
Anesthesia Chart Review: ? ?Follows with cardiology for history of CAD s/p CABG times 05/09/1998, HLD, lacunar stroke 2019, moderate AS.  Last seen by Dr. Claiborne Billings 04/08/2021 and noted to be doing well at that time, exercising 5 days/week.  Repeat echo was ordered to follow aortic stenosis.  Echo 10/02/2021 showed normal LVEF 60 to 65%, grade 1 DD, moderate AAS with mean gradient 18.8 mmHg.  Dr. Claiborne Billings did comment on results stating that visually, aortic valve excursion experienced more significantly reduced. ? ?Patient recently had MRI brain for evaluation after fall on 02/09/2022.  Incidentally, this showed enhancing infiltrative brain mass, consistent with primary CNS neoplasm.  Patient had outpatient follow-up with Dr. Mickeal Skinner on 02/11/2022.  Per note, " Suspicion is greatest for intermediate or high grade glioma given burden of enhancement and infiltration, though lymphoma falls into differential as well.  MRI from 2019 (stroke) did not demonstrate any sign of gliomatosis."  Diagnostic biopsy has been recommended. ? ?DM2, well controlled, A1c 6.7 on preop labs. ? ?Preop labs reviewed, unremarkable. ? ?EKG 02/08/2022: Sinus rhythm.  Rate 74.  LAD.   ? ?TTE 10/02/2021: ? 1. Left ventricular ejection fraction, by estimation, is 60 to 65%. The  ?left ventricle has normal function. The left ventricle has no regional  ?wall motion abnormalities. There is mild left ventricular hypertrophy of  ?the basal-septal segment. Left  ?ventricular diastolic parameters are consistent with Grade I diastolic  ?dysfunction (impaired relaxation).  ? 2. Right ventricular systolic function is normal. The right ventricular  ?size is normal.  ? 3. Left atrial size was mildly dilated.  ? 4. The mitral valve is normal in structure. No evidence of mitral valve  ?regurgitation. No evidence of mitral stenosis.  ? 5. The aortic valve is tricuspid. There is moderate calcification of the  ?aortic valve. Aortic valve regurgitation is mild. Moderate aortic  valve  ?stenosis. Aortic regurgitation PHT measures 410 msec. Aortic valve area,  ?by VTI measures 1.27 cm?Marland Kitchen Aortic  ?valve mean gradient measures 18.8 mmHg. Aortic valve Vmax measures 2.79  ?m/s. Visually aortic stenosis appears severe with minimal leaflet opening.  ? 6. The inferior vena cava is normal in size with greater than 50%  ?respiratory variability, suggesting right atrial pressure of 3 mmHg. ? ? ? ?Zachary Caldwell, PA-C ?[facility]/Anesthesiology ?Phone [phone] ?02/26/2022 2:43 PM ? ?

## 2022-02-26 NOTE — Anesthesia Preprocedure Evaluation (Addendum)
Anesthesia Evaluation  ?Patient identified by MRN, date of birth, ID band ?Patient awake ? ? ? ?Reviewed: ?Allergy & Precautions, NPO status , Patient's Chart, lab work & pertinent test results ? ?Airway ?Mallampati: II ? ?TM Distance: >3 FB ?Neck ROM: Full ? ? ? Dental ?no notable dental hx. ?(+) Caps, Dental Advisory Given, Teeth Intact ?  ?Pulmonary ?former smoker,  ?  ?Pulmonary exam normal ?breath sounds clear to auscultation ? ? ? ? ? ? Cardiovascular ?hypertension, + Past MI and + CABG  ?Normal cardiovascular exam+ Valvular Problems/Murmurs AS  ?Rhythm:Regular Rate:Normal ? ?09/2021 Echo ??1. Left ventricular ejection fraction, by estimation, is 60 to 65%. The  ?left ventricle has normal function. The left ventricle has no regional  ?wall motion abnormalities. There is mild left ventricular hypertrophy of  ?the basal-septal segment. Left  ?ventricular diastolic parameters are consistent with Grade I diastolic  ?dysfunction (impaired relaxation).  ??2. Right ventricular systolic function is normal. The right ventricular  ?size is normal.  ??3. Left atrial size was mildly dilated.  ??4. The mitral valve is normal in structure. No evidence of mitral valve  ?regurgitation. No evidence of mitral stenosis.  ??5. The aortic valve is tricuspid. There is moderate calcification of the  ?aortic valve. Aortic valve regurgitation is mild. Moderate aortic valve  ?stenosis. Aortic regurgitation PHT measures 410 msec. Aortic valve area,  ?by VTI measures 1.27 cm?Marland Kitchen Aortic  ?valve mean gradient measures 18.8 mmHg. Aortic valve Vmax measures 2.79  ?m/s. Visually aortic stenosis appears severe with minimal leaflet opening.  ??6. The inferior vena cava is normal in size with greater than 50%  ?respiratory variability, suggesting right atrial pressure of 3 mmHg.  ?  ?Neuro/Psych ?Primary CNS neoplasm ?CVA   ? GI/Hepatic ?negative GI ROS, Neg liver ROS,   ?Endo/Other  ?diabetes, Type 2 ?  Renal/GU ?Renal InsufficiencyRenal diseaseLab Results ?     Component                Value               Date                 ?     CREATININE               1.28 (H)            02/25/2022         ?     K                        4.3                 02/25/2022           ?         ?  ? ?  ?Musculoskeletal ? ? Abdominal ?  ?Peds ? Hematology ?Lab Results ?     Component                Value               Date                 ?     HGB                      14.1                02/25/2022           ?  HCT                      43.8                02/25/2022                  ?     PLT                      303                 02/25/2022           ?   ?Anesthesia Other Findings ?All: erythromycin ? ?Non hodgekins lymphoma ? Reproductive/Obstetrics ? ?  ? ? ? ? ? ? ? ? ? ? ? ? ? ?  ?  ? ? ? ? ? ?Anesthesia Physical ?Anesthesia Plan ? ?ASA: 3 ? ?Anesthesia Plan: General  ? ?Post-op Pain Management: Minimal or no pain anticipated  ? ?Induction: Intravenous ? ?PONV Risk Score and Plan: 3 and Treatment may vary due to age or medical condition and Ondansetron ? ?Airway Management Planned: Oral ETT ? ?Additional Equipment: Arterial line ? ?Intra-op Plan:  ? ?Post-operative Plan: Extubation in OR ? ?Informed Consent: I have reviewed the patients History and Physical, chart, labs and discussed the procedure including the risks, benefits and alternatives for the proposed anesthesia with the patient or authorized representative who has indicated his/her understanding and acceptance.  ? ? ? ?Dental advisory given ? ?Plan Discussed with: CRNA and Anesthesiologist ? ?Anesthesia Plan Comments: (PAT note by Karoline Caldwell, PA-C: ? ?Follows with cardiology for history of CAD s/p CABG times 05/09/1998, HLD, lacunar stroke 2019, moderate AS.  Last seen by Dr. Claiborne Billings 04/08/2021 and noted to be doing well at that time, exercising 5 days/week.  Repeat echo was ordered to follow aortic stenosis.  Echo 10/02/2021 showed normal LVEF 60 to 65%, grade 1 DD,  moderate AAS with mean gradient 18.8 mmHg.  Dr. Claiborne Billings did comment on results stating that visually, aortic valve excursion experienced more significantly reduced. ? ?Patient recently had MRI brain for evaluation after fall on 02/09/2022.  Incidentally, this showed enhancing infiltrative brain mass, consistent with primary CNS neoplasm.  Patient had outpatient follow-up with Dr. Mickeal Skinner on 02/11/2022.  Per note, "?Suspicion is greatest for intermediate or high grade glioma given burden of enhancement and infiltration, though lymphoma falls into differential as well. ?MRI from 2019 (stroke) did not demonstrate any sign of gliomatosis."  Diagnostic biopsy has been recommended. ? ?DM2, well controlled, A1c 6.7 on preop labs. ? ?Preop labs reviewed, unremarkable. ? ?EKG 02/08/2022: Sinus rhythm.  Rate 74.  LAD.   ? ?TTE 10/02/2021: ??1. Left ventricular ejection fraction, by estimation, is 60 to 65%. The  ?left ventricle has normal function. The left ventricle has no regional  ?wall motion abnormalities. There is mild left ventricular hypertrophy of  ?the basal-septal segment. Left  ?ventricular diastolic parameters are consistent with Grade I diastolic  ?dysfunction (impaired relaxation).  ??2. Right ventricular systolic function is normal. The right ventricular  ?size is normal.  ??3. Left atrial size was mildly dilated.  ??4. The mitral valve is normal in structure. No evidence of mitral valve  ?regurgitation. No evidence of mitral stenosis.  ??5. The aortic valve is tricuspid. There is moderate calcification of the  ?aortic valve. Aortic valve regurgitation is mild. Moderate aortic valve  ?stenosis. Aortic regurgitation PHT measures 410 msec. Aortic valve  area,  ?by VTI measures 1.27 cm?Marland Kitchen Aortic  ?valve mean gradient measures 18.8 mmHg. Aortic valve Vmax measures 2.79  ?m/s. Visually aortic stenosis appears severe with minimal leaflet opening.  ??6. The inferior vena cava is normal in size with greater than 50%   ?respiratory variability, suggesting right atrial pressure of 3 mmHg. ?)  ? ? ? ? ?Anesthesia Quick Evaluation ? ?

## 2022-02-28 ENCOUNTER — Inpatient Hospital Stay (HOSPITAL_COMMUNITY)
Admission: RE | Admit: 2022-02-28 | Discharge: 2022-03-01 | DRG: 027 | Disposition: A | Discharge status: Home / Self Care | Payer: Medicare Other | Attending: Neurological Surgery | Admitting: Neurological Surgery

## 2022-02-28 ENCOUNTER — Other Ambulatory Visit: Payer: Self-pay

## 2022-02-28 ENCOUNTER — Inpatient Hospital Stay (HOSPITAL_COMMUNITY): Payer: Medicare Other | Admitting: Certified Registered Nurse Anesthetist

## 2022-02-28 ENCOUNTER — Encounter (HOSPITAL_COMMUNITY)
Admission: RE | Disposition: A | Discharge status: Home / Self Care | Payer: Self-pay | Source: Home / Self Care | Attending: Neurological Surgery

## 2022-02-28 ENCOUNTER — Encounter (HOSPITAL_COMMUNITY): Payer: Self-pay | Admitting: Neurological Surgery

## 2022-02-28 ENCOUNTER — Inpatient Hospital Stay (HOSPITAL_COMMUNITY): Payer: Medicare Other | Admitting: Physician Assistant

## 2022-02-28 ENCOUNTER — Inpatient Hospital Stay (HOSPITAL_COMMUNITY): Payer: Medicare Other

## 2022-02-28 DIAGNOSIS — I10 Essential (primary) hypertension: Secondary | ICD-10-CM | POA: Diagnosis present

## 2022-02-28 DIAGNOSIS — Z951 Presence of aortocoronary bypass graft: Secondary | ICD-10-CM

## 2022-02-28 DIAGNOSIS — C719 Malignant neoplasm of brain, unspecified: Secondary | ICD-10-CM | POA: Diagnosis present

## 2022-02-28 DIAGNOSIS — I252 Old myocardial infarction: Secondary | ICD-10-CM

## 2022-02-28 DIAGNOSIS — E119 Type 2 diabetes mellitus without complications: Secondary | ICD-10-CM | POA: Diagnosis present

## 2022-02-28 DIAGNOSIS — D496 Neoplasm of unspecified behavior of brain: Secondary | ICD-10-CM | POA: Diagnosis present

## 2022-02-28 DIAGNOSIS — Z8572 Personal history of non-Hodgkin lymphomas: Secondary | ICD-10-CM

## 2022-02-28 DIAGNOSIS — Z881 Allergy status to other antibiotic agents status: Secondary | ICD-10-CM | POA: Diagnosis not present

## 2022-02-28 DIAGNOSIS — C714 Malignant neoplasm of occipital lobe: Principal | ICD-10-CM | POA: Diagnosis present

## 2022-02-28 DIAGNOSIS — Z87891 Personal history of nicotine dependence: Secondary | ICD-10-CM | POA: Diagnosis not present

## 2022-02-28 DIAGNOSIS — H9190 Unspecified hearing loss, unspecified ear: Secondary | ICD-10-CM | POA: Diagnosis present

## 2022-02-28 DIAGNOSIS — Z8673 Personal history of transient ischemic attack (TIA), and cerebral infarction without residual deficits: Secondary | ICD-10-CM

## 2022-02-28 DIAGNOSIS — Z20822 Contact with and (suspected) exposure to covid-19: Secondary | ICD-10-CM | POA: Diagnosis present

## 2022-02-28 HISTORY — PX: APPLICATION OF CRANIAL NAVIGATION: SHX6578

## 2022-02-28 HISTORY — PX: FRAMELESS  BIOPSY WITH BRAINLAB: SHX6879

## 2022-02-28 LAB — CBC
HCT: 39.5 % (ref 39.0–52.0)
Hemoglobin: 13.2 g/dL (ref 13.0–17.0)
MCH: 31.7 pg (ref 26.0–34.0)
MCHC: 33.4 g/dL (ref 30.0–36.0)
MCV: 95 fL (ref 80.0–100.0)
Platelets: 283 10*3/uL (ref 150–400)
RBC: 4.16 MIL/uL — ABNORMAL LOW (ref 4.22–5.81)
RDW: 14.6 % (ref 11.5–15.5)
WBC: 8.9 10*3/uL (ref 4.0–10.5)
nRBC: 0 % (ref 0.0–0.2)

## 2022-02-28 LAB — GLUCOSE, CAPILLARY
Glucose-Capillary: 163 mg/dL — ABNORMAL HIGH (ref 70–99)
Glucose-Capillary: 158 mg/dL — ABNORMAL HIGH (ref 70–99)
Glucose-Capillary: 248 mg/dL — ABNORMAL HIGH (ref 70–99)
Glucose-Capillary: 233 mg/dL — ABNORMAL HIGH (ref 70–99)

## 2022-02-28 LAB — MRSA NEXT GEN BY PCR, NASAL: MRSA by PCR Next Gen: NOT DETECTED

## 2022-02-28 LAB — CREATININE, SERUM
Creatinine, Ser: 1.04 mg/dL (ref 0.61–1.24)
GFR, Estimated: >60 mL/min (ref >60)

## 2022-02-28 LAB — ABO/RH: ABO/RH(D): O POS

## 2022-02-28 SURGERY — FRAMELESS BIOPSY WITH BRAINLAB
Anesthesia: General | Laterality: Right

## 2022-02-28 MED ORDER — ACETAMINOPHEN 650 MG RE SUPP: 650.0000 mg | RECTAL | Status: DC | PRN

## 2022-02-28 MED ORDER — FENTANYL CITRATE (PF) 250 MCG/5ML IJ SOLN
INTRAMUSCULAR | Status: AC
  Filled 2022-02-28: qty 5

## 2022-02-28 MED ORDER — HYDROMORPHONE HCL 1 MG/ML IJ SOLN: 0.5000 mg | INTRAMUSCULAR | Status: DC | PRN

## 2022-02-28 MED ORDER — ONDANSETRON HCL 4 MG PO TABS: 4.0000 mg | ORAL_TABLET | ORAL | Status: DC | PRN

## 2022-02-28 MED ORDER — BACITRACIN ZINC 500 UNIT/GM EX OINT
TOPICAL_OINTMENT | CUTANEOUS | Status: DC | PRN
  Administered 2022-02-28: 1 via TOPICAL

## 2022-02-28 MED ORDER — SODIUM CHLORIDE 0.9 % IV SOLN: INTRAVENOUS | Status: DC | PRN

## 2022-02-28 MED ORDER — LIDOCAINE 2% (20 MG/ML) 5 ML SYRINGE
INTRAMUSCULAR | Status: DC | PRN
  Administered 2022-02-28: 100 mg via INTRAVENOUS

## 2022-02-28 MED ORDER — SALINE SPRAY 0.65 % NA SOLN: 1.0000 | Freq: Every day | NASAL | Status: DC

## 2022-02-28 MED ORDER — PROPOFOL 10 MG/ML IV BOLUS
INTRAVENOUS | Status: DC | PRN
  Administered 2022-02-28 (×2): 30 mg via INTRAVENOUS
  Administered 2022-02-28: 70 mg via INTRAVENOUS

## 2022-02-28 MED ORDER — PROPOFOL 10 MG/ML IV BOLUS
INTRAVENOUS | Status: AC
  Filled 2022-02-28: qty 20

## 2022-02-28 MED ORDER — SODIUM CHLORIDE 0.9 % IV SOLN
INTRAVENOUS | Status: DC | PRN
Start: 2022-02-28 — End: 2022-02-28

## 2022-02-28 MED ORDER — THROMBIN 20000 UNITS EX SOLR
CUTANEOUS | Status: AC
  Filled 2022-02-28: qty 20000

## 2022-02-28 MED ORDER — PHENYLEPHRINE HCL-NACL 20-0.9 MG/250ML-% IV SOLN
INTRAVENOUS | Status: DC | PRN
Start: 2022-02-28 — End: 2022-02-28
  Administered 2022-02-28: 40 ug/min via INTRAVENOUS

## 2022-02-28 MED ORDER — ONDANSETRON HCL 4 MG/2ML IJ SOLN
INTRAMUSCULAR | Status: AC
  Filled 2022-02-28: qty 2

## 2022-02-28 MED ORDER — PHENYLEPHRINE 40 MCG/ML (10ML) SYRINGE FOR IV PUSH (FOR BLOOD PRESSURE SUPPORT)
PREFILLED_SYRINGE | INTRAVENOUS | Status: AC
  Filled 2022-02-28: qty 10

## 2022-02-28 MED ORDER — POLYETHYLENE GLYCOL 3350 17 G PO PACK
17.0000 g | PACK | Freq: Every day | ORAL | Status: DC | PRN
  Administered 2022-02-28 – 2022-03-01 (×2): 17 g via ORAL
  Filled 2022-02-28 (×2): qty 1

## 2022-02-28 MED ORDER — LACTATED RINGERS IV SOLN
INTRAVENOUS | Status: DC
Start: 2022-02-28 — End: 2022-02-28

## 2022-02-28 MED ORDER — DOCUSATE SODIUM 100 MG PO CAPS
100.0000 mg | ORAL_CAPSULE | Freq: Two times a day (BID) | ORAL | Status: DC
  Administered 2022-02-28 – 2022-03-01 (×2): 100 mg via ORAL
  Filled 2022-02-28 (×2): qty 1

## 2022-02-28 MED ORDER — ACETAMINOPHEN 325 MG PO TABS: 650.0000 mg | ORAL_TABLET | ORAL | Status: DC | PRN

## 2022-02-28 MED ORDER — 0.9 % SODIUM CHLORIDE (POUR BTL) OPTIME
TOPICAL | Status: DC | PRN
  Administered 2022-02-28: 1000 mL

## 2022-02-28 MED ORDER — CHLORHEXIDINE GLUCONATE 0.12 % MT SOLN
15.0000 mL | Freq: Once | OROMUCOSAL | Status: AC
  Administered 2022-02-28: 15 mL via OROMUCOSAL
  Filled 2022-02-28: qty 15

## 2022-02-28 MED ORDER — METOPROLOL SUCCINATE ER 25 MG PO TB24
12.5000 mg | ORAL_TABLET | Freq: Every day | ORAL | Status: DC
  Filled 2022-02-28: qty 1

## 2022-02-28 MED ORDER — INSULIN ASPART 100 UNIT/ML IJ SOLN
0.0000 [IU] | Freq: Three times a day (TID) | INTRAMUSCULAR | Status: DC
  Administered 2022-02-28: 7 [IU] via SUBCUTANEOUS
  Administered 2022-03-01: 3 [IU] via SUBCUTANEOUS

## 2022-02-28 MED ORDER — PHENYLEPHRINE 40 MCG/ML (10ML) SYRINGE FOR IV PUSH (FOR BLOOD PRESSURE SUPPORT)
PREFILLED_SYRINGE | INTRAVENOUS | Status: DC | PRN
  Administered 2022-02-28: 80 ug via INTRAVENOUS

## 2022-02-28 MED ORDER — HEPARIN SODIUM (PORCINE) 5000 UNIT/ML IJ SOLN: 5000.0000 [IU] | Freq: Three times a day (TID) | INTRAMUSCULAR | Status: DC

## 2022-02-28 MED ORDER — THROMBIN 5000 UNITS EX SOLR: OROMUCOSAL | Status: DC | PRN

## 2022-02-28 MED ORDER — GLIPIZIDE 5 MG PO TABS
2.5000 mg | ORAL_TABLET | Freq: Every day | ORAL | Status: DC
  Administered 2022-03-01: 2.5 mg via ORAL
  Filled 2022-02-28 (×2): qty 1

## 2022-02-28 MED ORDER — ADULT MULTIVITAMIN W/MINERALS CH
1.0000 | ORAL_TABLET | Freq: Every day | ORAL | Status: DC
  Administered 2022-03-01: 1 via ORAL
  Filled 2022-02-28: qty 1

## 2022-02-28 MED ORDER — PROMETHAZINE HCL 25 MG PO TABS: 12.5000 mg | ORAL_TABLET | ORAL | Status: DC | PRN

## 2022-02-28 MED ORDER — BACITRACIN ZINC 500 UNIT/GM EX OINT
TOPICAL_OINTMENT | CUTANEOUS | Status: AC
  Filled 2022-02-28: qty 28.35

## 2022-02-28 MED ORDER — LIDOCAINE-EPINEPHRINE 1 %-1:100000 IJ SOLN
INTRAMUSCULAR | Status: AC
  Filled 2022-02-28: qty 1

## 2022-02-28 MED ORDER — FENTANYL CITRATE (PF) 250 MCG/5ML IJ SOLN
INTRAMUSCULAR | Status: DC | PRN
  Administered 2022-02-28 (×2): 50 ug via INTRAVENOUS

## 2022-02-28 MED ORDER — THROMBIN 5000 UNITS EX SOLR
CUTANEOUS | Status: AC
  Filled 2022-02-28: qty 5000

## 2022-02-28 MED ORDER — EPHEDRINE SULFATE-NACL 50-0.9 MG/10ML-% IV SOSY
PREFILLED_SYRINGE | INTRAVENOUS | Status: DC | PRN
  Administered 2022-02-28: 5 mg via INTRAVENOUS

## 2022-02-28 MED ORDER — DEXAMETHASONE SODIUM PHOSPHATE 10 MG/ML IJ SOLN
INTRAMUSCULAR | Status: AC
  Filled 2022-02-28: qty 1

## 2022-02-28 MED ORDER — LIDOCAINE 2% (20 MG/ML) 5 ML SYRINGE
INTRAMUSCULAR | Status: AC
  Filled 2022-02-28: qty 5

## 2022-02-28 MED ORDER — EPHEDRINE 5 MG/ML INJ
INTRAVENOUS | Status: AC
  Filled 2022-02-28: qty 5

## 2022-02-28 MED ORDER — ROCURONIUM BROMIDE 10 MG/ML (PF) SYRINGE
PREFILLED_SYRINGE | INTRAVENOUS | Status: AC
  Filled 2022-02-28: qty 10

## 2022-02-28 MED ORDER — ORAL CARE MOUTH RINSE: 15.0000 mL | Freq: Once | OROMUCOSAL | Status: AC

## 2022-02-28 MED ORDER — ROCURONIUM BROMIDE 10 MG/ML (PF) SYRINGE
PREFILLED_SYRINGE | INTRAVENOUS | Status: DC | PRN
Start: 2022-02-28 — End: 2022-02-28
  Administered 2022-02-28: 60 mg via INTRAVENOUS

## 2022-02-28 MED ORDER — HYDROCODONE-ACETAMINOPHEN 5-325 MG PO TABS: 1.0000 | ORAL_TABLET | ORAL | Status: DC | PRN

## 2022-02-28 MED ORDER — ONDANSETRON HCL 4 MG/2ML IJ SOLN: 4.0000 mg | INTRAMUSCULAR | Status: DC | PRN

## 2022-02-28 MED ORDER — CHLORHEXIDINE GLUCONATE CLOTH 2 % EX PADS: 6.0000 | MEDICATED_PAD | Freq: Once | CUTANEOUS | Status: DC

## 2022-02-28 MED ORDER — ROSUVASTATIN CALCIUM 5 MG PO TABS
5.0000 mg | ORAL_TABLET | Freq: Every day | ORAL | Status: DC
  Administered 2022-03-01: 5 mg via ORAL
  Filled 2022-02-28: qty 1

## 2022-02-28 MED ORDER — DEXAMETHASONE SODIUM PHOSPHATE 10 MG/ML IJ SOLN
INTRAMUSCULAR | Status: DC | PRN
  Administered 2022-02-28: 10 mg via INTRAVENOUS

## 2022-02-28 MED ORDER — SUGAMMADEX SODIUM 200 MG/2ML IV SOLN
INTRAVENOUS | Status: DC | PRN
  Administered 2022-02-28: 200 mg via INTRAVENOUS

## 2022-02-28 MED ORDER — CHLORHEXIDINE GLUCONATE CLOTH 2 % EX PADS
6.0000 | MEDICATED_PAD | Freq: Every day | CUTANEOUS | Status: DC
  Administered 2022-03-01: 6 via TOPICAL

## 2022-02-28 MED ORDER — ONDANSETRON HCL 4 MG/2ML IJ SOLN
INTRAMUSCULAR | Status: DC | PRN
  Administered 2022-02-28: 4 mg via INTRAVENOUS

## 2022-02-28 MED ORDER — INSULIN ASPART 100 UNIT/ML IJ SOLN: 0.0000 [IU] | Freq: Three times a day (TID) | INTRAMUSCULAR | Status: DC

## 2022-02-28 MED ORDER — CEFAZOLIN SODIUM-DEXTROSE 2-4 GM/100ML-% IV SOLN
2.0000 g | INTRAVENOUS | Status: AC
  Administered 2022-02-28: 2 g via INTRAVENOUS
  Filled 2022-02-28: qty 100

## 2022-02-28 MED ORDER — CEFAZOLIN SODIUM-DEXTROSE 2-4 GM/100ML-% IV SOLN
2.0000 g | Freq: Three times a day (TID) | INTRAVENOUS | Status: AC
  Administered 2022-02-28 – 2022-03-01 (×2): 2 g via INTRAVENOUS
  Filled 2022-02-28 (×2): qty 100

## 2022-02-28 MED ORDER — INSULIN ASPART 100 UNIT/ML IJ SOLN: 0.0000 [IU] | INTRAMUSCULAR | Status: DC | PRN

## 2022-02-28 MED ORDER — LABETALOL HCL 5 MG/ML IV SOLN: 10.0000 mg | INTRAVENOUS | Status: DC | PRN

## 2022-02-28 SURGICAL SUPPLY — 88 items
BAG COUNTER SPONGE SURGICOUNT (BAG) ×3 IMPLANT
BAND RUBBER #18 3X1/16 STRL (MISCELLANEOUS) IMPLANT
BENZOIN TINCTURE PRP APPL 2/3 (GAUZE/BANDAGES/DRESSINGS) IMPLANT
BLADE CLIPPER SURG (BLADE) ×3 IMPLANT
BLADE SAW GIGLI 16 STRL (MISCELLANEOUS) IMPLANT
BLADE SURG 15 STRL LF DISP TIS (BLADE) IMPLANT
BLADE SURG 15 STRL SS (BLADE)
BNDG GAUZE ELAST 4 BULKY (GAUZE/BANDAGES/DRESSINGS) IMPLANT
BNDG STRETCH 4X75 STRL LF (GAUZE/BANDAGES/DRESSINGS) IMPLANT
BUR ACORN 9.0 PRECISION (BURR) ×3 IMPLANT
BUR ROUND PRECISION 4.0 (BURR) IMPLANT
BUR SPIRAL ROUTER 2.3 (BUR) ×3 IMPLANT
CANISTER SUCT 3000ML PPV (MISCELLANEOUS) ×6 IMPLANT
CATH VENTRIC 35X38 W/TROCAR LG (CATHETERS) IMPLANT
CLIP VESOCCLUDE MED 6/CT (CLIP) IMPLANT
CNTNR URN SCR LID CUP LEK RST (MISCELLANEOUS) ×2 IMPLANT
CONT SPEC 4OZ STRL OR WHT (MISCELLANEOUS) ×1
COVER MAYO STAND STRL (DRAPES) IMPLANT
DECANTER SPIKE VIAL GLASS SM (MISCELLANEOUS) ×2 IMPLANT
DRAIN SUBARACHNOID (WOUND CARE) IMPLANT
DRAPE HALF SHEET 40X57 (DRAPES) ×3 IMPLANT
DRAPE MICROSCOPE LEICA (MISCELLANEOUS) IMPLANT
DRAPE NEUROLOGICAL W/INCISE (DRAPES) ×3 IMPLANT
DRAPE SHEET LG 3/4 BI-LAMINATE (DRAPES) ×3 IMPLANT
DRAPE STERI IOBAN 125X83 (DRAPES) IMPLANT
DRAPE SURG 17X23 STRL (DRAPES) IMPLANT
DRAPE WARM FLUID 44X44 (DRAPES) ×3 IMPLANT
DRSG ADAPTIC 3X8 NADH LF (GAUZE/BANDAGES/DRESSINGS) IMPLANT
DRSG TELFA 3X8 NADH (GAUZE/BANDAGES/DRESSINGS) IMPLANT
DURAPREP 6ML APPLICATOR 50/CS (WOUND CARE) ×3 IMPLANT
ELECT REM PT RETURN 9FT ADLT (ELECTROSURGICAL) ×3
ELECTRODE REM PT RTRN 9FT ADLT (ELECTROSURGICAL) ×2 IMPLANT
EVACUATOR 1/8 PVC DRAIN (DRAIN) IMPLANT
EVACUATOR SILICONE 100CC (DRAIN) IMPLANT
FORCEPS BIPO MALIS IRRIG 9X1.5 (NEUROSURGERY SUPPLIES) ×2 IMPLANT
GAUZE 4X4 16PLY ~~LOC~~+RFID DBL (SPONGE) IMPLANT
GAUZE SPONGE 4X4 12PLY STRL (GAUZE/BANDAGES/DRESSINGS) IMPLANT
GLOVE EXAM NITRILE LRG STRL (GLOVE) IMPLANT
GLOVE EXAM NITRILE XL STR (GLOVE) IMPLANT
GLOVE EXAM NITRILE XS STR PU (GLOVE) IMPLANT
GLOVE SURG LTX SZ7.5 (GLOVE) ×4 IMPLANT
GLOVE SURG UNDER POLY LF SZ6.5 (GLOVE) ×2 IMPLANT
GLOVE SURG UNDER POLY LF SZ7.5 (GLOVE) ×5 IMPLANT
GOWN STRL REUS W/ TWL LRG LVL3 (GOWN DISPOSABLE) ×4 IMPLANT
GOWN STRL REUS W/ TWL XL LVL3 (GOWN DISPOSABLE) IMPLANT
GOWN STRL REUS W/TWL 2XL LVL3 (GOWN DISPOSABLE) IMPLANT
GOWN STRL REUS W/TWL LRG LVL3 (GOWN DISPOSABLE) ×2
GOWN STRL REUS W/TWL XL LVL3 (GOWN DISPOSABLE)
HEMOSTAT POWDER KIT SURGIFOAM (HEMOSTASIS) ×3 IMPLANT
HEMOSTAT SURGICEL 2X14 (HEMOSTASIS) ×2 IMPLANT
HOOK DURA 1/2IN (MISCELLANEOUS) ×3 IMPLANT
IV NS 1000ML (IV SOLUTION) ×1
IV NS 1000ML BAXH (IV SOLUTION) ×2 IMPLANT
KIT BASIN OR (CUSTOM PROCEDURE TRAY) ×3 IMPLANT
KIT DRAIN CSF ACCUDRAIN (MISCELLANEOUS) IMPLANT
KIT NDL BIOPSY 1.8X235 CRAN (NEEDLE) IMPLANT
KIT NEEDLE BIOPSY 1.8X235 CRAN (NEEDLE) ×3 IMPLANT
KIT TURNOVER KIT B (KITS) ×3 IMPLANT
KIT VARIOGUIDE DRILL 1.9 (KITS) ×1 IMPLANT
MARKER SPHERE PSV REFLC 13MM (MARKER) ×9 IMPLANT
NDL SPNL 18GX3.5 QUINCKE PK (NEEDLE) IMPLANT
NEEDLE HYPO 22GX1.5 SAFETY (NEEDLE) ×3 IMPLANT
NEEDLE SPNL 18GX3.5 QUINCKE PK (NEEDLE) IMPLANT
NS IRRIG 1000ML POUR BTL (IV SOLUTION) ×8 IMPLANT
PACK CRANIOTOMY CUSTOM (CUSTOM PROCEDURE TRAY) ×3 IMPLANT
PAD DRESSING TELFA 3X8 NADH (GAUZE/BANDAGES/DRESSINGS) IMPLANT
PATTIES SURGICAL .25X.25 (GAUZE/BANDAGES/DRESSINGS) IMPLANT
PATTIES SURGICAL .5 X.5 (GAUZE/BANDAGES/DRESSINGS) IMPLANT
PATTIES SURGICAL .5 X3 (DISPOSABLE) IMPLANT
PATTIES SURGICAL 1/4 X 3 (GAUZE/BANDAGES/DRESSINGS) IMPLANT
PATTIES SURGICAL 1X1 (DISPOSABLE) IMPLANT
PIN MAYFIELD SKULL DISP (PIN) ×3 IMPLANT
SPECIMEN JAR SMALL (MISCELLANEOUS) IMPLANT
SPONGE NEURO XRAY DETECT 1X3 (DISPOSABLE) IMPLANT
SPONGE SURGIFOAM ABS GEL 100 (HEMOSTASIS) ×2 IMPLANT
STAPLER VISISTAT 35W (STAPLE) ×3 IMPLANT
SUT ETHILON 3 0 FSL (SUTURE) IMPLANT
SUT ETHILON 3 0 PS 1 (SUTURE) IMPLANT
SUT MNCRL AB 3-0 PS2 18 (SUTURE) ×1 IMPLANT
SUT NURALON 4 0 TR CR/8 (SUTURE) ×6 IMPLANT
SUT SILK 0 TIES 10X30 (SUTURE) IMPLANT
SUT VIC AB 2-0 CP2 18 (SUTURE) ×3 IMPLANT
TOWEL GREEN STERILE (TOWEL DISPOSABLE) ×3 IMPLANT
TOWEL GREEN STERILE FF (TOWEL DISPOSABLE) ×3 IMPLANT
TRAY FOLEY MTR SLVR 16FR STAT (SET/KITS/TRAYS/PACK) ×3 IMPLANT
TUBE CONNECTING 12X1/4 (SUCTIONS) ×3 IMPLANT
UNDERPAD 30X36 HEAVY ABSORB (UNDERPADS AND DIAPERS) ×3 IMPLANT
WATER STERILE IRR 1000ML POUR (IV SOLUTION) ×3 IMPLANT

## 2022-02-28 NOTE — Transfer of Care (Signed)
Immediate Anesthesia Transfer of Care Note ? ?Patient: Zachary Spence ? ?Procedure(s) Performed: Right Stereotactic brain biopsy with brainlab (Right) ?APPLICATION OF CRANIAL NAVIGATION ? ?Patient Location: PACU ? ?Anesthesia Type:General ? ?Level of Consciousness: drowsy ? ?Airway & Oxygen Therapy: Patient Spontanous Breathing and Patient connected to face mask oxygen ? ?Post-op Assessment: Report given to RN, Post -op Vital signs reviewed and stable and Patient moving all extremities X 4 ? ?Post vital signs: Reviewed and stable ? ?Last Vitals:  ?Vitals Value Taken Time  ?BP 105/58 02/28/22 0918  ?Temp    ?Pulse 64 02/28/22 0919  ?Resp 21 02/28/22 0919  ?SpO2 97 % 02/28/22 0919  ?Vitals shown include unvalidated device data. ? ?Last Pain:  ?Vitals:  ? 02/28/22 0609  ?TempSrc:   ?PainSc: 0-No pain  ?   ? ?  ? ?Complications: No notable events documented. ?

## 2022-02-28 NOTE — Progress Notes (Signed)
?  Transition of Care (TOC) Screening Note ? ? ?Patient Details  ?Name: CIRO TASHIRO ?Date of Birth: [birth year] ? ? ?Transition of Care [facility]) CM/SW Contact:    ?Ella Bodo, RN ?Phone Number: ?02/28/2022, 4:28 PM ? ? ? ?Transition of Care Department [facility]) has reviewed patient and no TOC needs have been identified at this time. We will continue to monitor patient advancement through interdisciplinary progression rounds. If new patient transition needs arise, please place a TOC consult. ? ?Reinaldo Raddle, RN, BSN  ?Trauma/Neuro ICU Case Manager ?[phone] ? ?

## 2022-02-28 NOTE — Op Note (Signed)
PATIENT: Zachary Spence ? ?DAY OF SURGERY: 02/28/22 ?  ?PRE-OPERATIVE DIAGNOSIS:  Right occipital brain tumor ?  ?POST-OPERATIVE DIAGNOSIS:  Same ?  ?PROCEDURE:  Right stereotactic brain biopsy ?  ?SURGEON:  Surgeon(s) and Role: ?   Judith Part, MD - Primary ?  ?ANESTHESIA: ETGA ?  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 Given his age, we discussed this at length and the patient wanted to proceed with a biopsy to obtain a diagnosis and help determine his prognosis and expected overall survival. After discussing the risks of the procedure as well as the alternatives, they wised to proceed with a stereotactic biopsy. This was discussed with the patient as well as risks, benefits, and alternatives and wished to proceed with surgery. ?  ?OPERATIVE DETAIL: The patient was taken to the operating room and placed on the OR table in the supine position. A formal time out was performed with two patient identifiers and confirmed the operative site. Anesthesia was induced by the anesthesia team. The [place] head holder was applied to the head and a registration array was attached to the [place]. This was co-registered with the patient's preoperative imaging, the fit appeared to be acceptable.  ? ?Preoperatively, I planned a trajectory on the brainlab system to target the tumor, avoid venous structures, and have the cutting window span the lesion. This was then transferred to the Brainlab intraoperative navigation system. ? ?Using frameless stereotaxy, the operative trajectory was planned and the incision was marked. Hair was clipped with surgical clippers over the incision and the area was then prepped and draped in a sterile fashion. ? ?The varioguide was attached, registered, and trajectory locked in. A small incision was made and the CD4 drill was used with a reducing tube to  create a small burr hole along the trajectory. The dura was pierced with the dural piercing instrument, the reducing tube was switched and the image-guided Brainlab needle was advanced to the pre-determined depth along the trajectory, monitoring the location along the pathway with image guidance. The cutting window was opened and biopsy samples were taken. An air bubble was placed in the biopsy cavity to correlate with post-operative imaging. The biopsy tract was irrigated until clear irrigation returned, the needle was removed, and the incision was irrigated copiously. The biopsy specimen was sent to pathology for analysis. ? ?All instrument and sponge counts were correct, the incision was then closed in layers. The patient was then returned to anesthesia for emergence. No apparent complications at the completion of the procedure. ?  ?EBL:  80m ?  ?DRAINS: none ?  ?SPECIMENS: Right occipital brain tumor ? ?TJudith Part MD ?02/28/22 ?9:09 AM ? ?

## 2022-02-28 NOTE — Evaluation (Signed)
Physical Therapy Evaluation ?Patient Details ?Name: Zachary Spence ?MRN: [id number] ?DOB: 28-Aug-1923 ?Today's Date: 02/28/2022 ? ?History of Present Illness ? 88 y.o. 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 Pt underwent brain biopsy on 3/10. PMH includes DMII, HTN, non-hodgkin's lymphoma, CVA.  ?Clinical Impression ? Pt presents to PT with deficits in gait, balance, endurance, but is not far from his functional baseline. Pt is able to perform bed mobility, transfer, and ambulate without physical assistance at this time. Pt will benefit from continued acute PT services and additional outpatient PT to aide in improving balance due to a history of recent falls.    ?   ? ?Recommendations for follow up therapy are one component of a multi-disciplinary discharge planning process, led by the attending physician.  Recommendations may be updated based on patient status, additional functional criteria and insurance authorization. ? ?Follow Up Recommendations Outpatient PT ? ?  ?Assistance Recommended at Discharge PRN  ?Patient can return home with the following ? A little help with bathing/dressing/bathroom;Help with stairs or ramp for entrance;Assistance with cooking/housework;Assist for transportation ? ?  ?Equipment Recommendations None recommended by PT  ?Recommendations for Other Services ?    ?  ?Functional Status Assessment Patient has had a recent decline in their functional status and demonstrates the ability to make significant improvements in function in a reasonable and predictable amount of time.  ? ?  ?Precautions / Restrictions Precautions ?Precautions: Fall ?Precaution Comments: 3 falls in the last 3 months ?Restrictions ?Weight Bearing Restrictions: No  ? ?  ? ?Mobility ? Bed Mobility ?Overal bed mobility: Needs Assistance ?Bed Mobility: Supine to Sit ?  ?  ?Supine to sit: Supervision ?  ?  ?  ?  ? ?Transfers ?Overall transfer level: Needs  assistance ?Equipment used: Rolling walker (2 wheels) ?Transfers: Sit to/from Stand ?Sit to Stand: Min guard ?  ?  ?  ?  ?  ?  ?  ? ?Ambulation/Gait ?Ambulation/Gait assistance: Supervision ?Gait Distance (Feet): 250 Feet ?Assistive device: Rolling walker (2 wheels) ?Gait Pattern/deviations: Step-through pattern ?Gait velocity: reduced ?Gait velocity interpretation: <1.8 ft/sec, indicate of risk for recurrent falls ?  ?General Gait Details: pt with slowed step-through gait, increased trunk flexion ? ?Stairs ?  ?  ?  ?  ?  ? ?Wheelchair Mobility ?  ? ?Modified Rankin (Stroke Patients Only) ?  ? ?  ? ?Balance Overall balance assessment: Needs assistance ?Sitting-balance support: No upper extremity supported, Feet supported ?Sitting balance-Leahy Scale: Fair ?  ?  ?Standing balance support: Bilateral upper extremity supported, Reliant on assistive device for balance, Single extremity supported ?Standing balance-Leahy Scale: Poor ?  ?  ?  ?  ?  ?  ?  ?  ?  ?  ?  ?  ?   ? ? ? ?Pertinent Vitals/Pain Pain Assessment ?Pain Assessment: No/denies pain  ? ? ?Home Living Family/patient expects to be discharged to:: Private residence ?Living Arrangements: Children ?Available Help at Discharge: Family;Available 24 hours/day ?Type of Home: House ?Home Access: Stairs to enter ?Entrance Stairs-Rails: None ?Entrance Stairs-Number of Steps: 1 + 1 ?  ?Home Layout: One level ?Home Equipment: Cane - single Barista (2 wheels) ?   ?  ?Prior Function Prior Level of Function : Independent/Modified Independent ?  ?  ?  ?  ?  ?  ?Mobility Comments: pt ambulates with RW, utilizes a cane in the gym as it is easier to maneuver. Enjoys working  out ?ADLs Comments: pt cooks microwavable meals, bathes and dresses independently ?  ? ? ?Hand Dominance  ? Dominant Hand: Right ? ?  ?Extremity/Trunk Assessment  ? Upper Extremity Assessment ?Upper Extremity Assessment: Overall WFL for tasks assessed ?  ? ?Lower Extremity Assessment ?Lower  Extremity Assessment: LLE deficits/detail ?LLE Deficits / Details: chronic L foot drop, otherwise WFL ?  ? ?Cervical / Trunk Assessment ?Cervical / Trunk Assessment: Kyphotic  ?Communication  ? Communication: HOH  ?Cognition Arousal/Alertness: Awake/alert ?Behavior During Therapy: [facility] for tasks assessed/performed ?Overall Cognitive Status: Within Functional Limits for tasks assessed ?  ?  ?  ?  ?  ?  ?  ?  ?  ?  ?  ?  ?  ?  ?  ?  ?  ?  ?  ? ?  ?General Comments General comments (skin integrity, edema, etc.): VSS on RA ? ?  ?Exercises    ? ?Assessment/Plan  ?  ?PT Assessment Patient needs continued PT services  ?PT Problem List Decreased activity tolerance;Decreased strength;Decreased balance ? ?   ?  ?PT Treatment Interventions Gait training;Stair training;Functional mobility training;Therapeutic activities;Balance training;Neuromuscular re-education;Patient/family education   ? ?PT Goals (Current goals can be found in the Care Plan section)  ?Acute Rehab PT Goals ?Patient Stated Goal: to return home ?PT Goal Formulation: With patient ?Time For Goal Achievement: 03/14/22 ?Potential to Achieve Goals: Good ? ?  ?Frequency Min 3X/week ?  ? ? ?Co-evaluation   ?  ?  ?  ?  ? ? ?  ?AM-PAC PT "6 Clicks" Mobility  ?Outcome Measure Help needed turning from your back to your side while in a flat bed without using bedrails?: A Little ?Help needed moving from lying on your back to sitting on the side of a flat bed without using bedrails?: A Little ?Help needed moving to and from a bed to a chair (including a wheelchair)?: A Little ?Help needed standing up from a chair using your arms (e.g., wheelchair or bedside chair)?: A Little ?Help needed to walk in hospital room?: A Little ?Help needed climbing 3-5 steps with a railing? : A Little ?6 Click Score: 18 ? ?  ?End of Session   ?Activity Tolerance: Patient tolerated treatment well ?Patient left: in chair;with call bell/phone within reach;with chair alarm set;with family/visitor  present ?Nurse Communication: Mobility status ?PT Visit Diagnosis: Other abnormalities of gait and mobility (R26.89);History of falling (Z91.81) ?  ? ?Time: 3716-9678 ?PT Time Calculation (min) (ACUTE ONLY): 29 min ? ? ?Charges:   PT Evaluation ?$PT Eval Low Complexity: 1 Low ?  ?  ?   ? ? ?Zenaida Niece, PT, DPT ?Acute Rehabilitation ?Pager: [phone] ?Office [phone] ? ? ?Zenaida Niece ?02/28/2022, 4:35 PM ? ?

## 2022-02-28 NOTE — H&P (Signed)
Surgical H&P Update ? ?HPI: 88 y.o. with a history of recent admission with workup showing primary brain tumor. No changes in health since I last saw him in clinic. No new concerns today, here for Sx Bx. ? ?PMHx:  ?Past Medical History:  ?Diagnosis Date  ? Bowel obstruction ([place])   ? Diabetes mellitus without complication ([place])   ? Hypertension   ? Non Hodgkin's lymphoma ([place])   ? Stroke [facility])   ? ?FamHx: History reviewed. No pertinent family history. ?SocHx:  reports that he has quit smoking. He has been exposed to tobacco smoke. He has never used smokeless tobacco. He reports that he does not currently use drugs. He reports that he does not drink alcohol. ? ?Physical Exam: ?Aox3, hard of hearing, PERRL, EOMI, FS & SS, strength 5/5x4, no drift, SILTx4 ? ?Assesment/Plan: ?88 y.o. 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 Risks, benefits, and alternatives discussed and the patient would like to continue with surgery. ? ?-OR today ?-4N ICU post-op ? ?Judith Part, MD ?02/28/22 ?7:18 AM ? ?

## 2022-02-28 NOTE — Anesthesia Postprocedure Evaluation (Signed)
Anesthesia Post Note ? ?Patient: Zachary Spence ? ?Procedure(s) Performed: Right Stereotactic brain biopsy with brainlab (Right) ?APPLICATION OF CRANIAL NAVIGATION ? ?  ? ?Patient location during evaluation: PACU ?Anesthesia Type: General ?Level of consciousness: awake and alert ?Pain management: pain level controlled ?Vital Signs Assessment: post-procedure vital signs reviewed and stable ?Respiratory status: spontaneous breathing, nonlabored ventilation, respiratory function stable and patient connected to nasal cannula oxygen ?Cardiovascular status: blood pressure returned to baseline and stable ?Postop Assessment: no apparent nausea or vomiting ?Anesthetic complications: no ? ? ?No notable events documented. ? ?Last Vitals:  ?Vitals:  ? 02/28/22 0951 02/28/22 1010  ?BP: (!) 99/57 113/69  ?Pulse: 67 72  ?Resp: 16 16  ?Temp: 36.4 ?C   ?SpO2: 95% 91%  ?  ?Last Pain:  ?Vitals:  ? 02/28/22 0951  ?TempSrc:   ?PainSc: 0-No pain  ? ? ?  ?  ?  ?  ?  ?  ? ?[name] ? ? ? ? ?

## 2022-02-28 NOTE — Anesthesia Procedure Notes (Signed)
Arterial Line Insertion ?Start/End3/09/2022 7:00 AM, 02/28/2022 7:10 AM ?Performed by: Reece Agar, CRNA, CRNA ? Patient location: Pre-op. ?Preanesthetic checklist: patient identified, IV checked, site marked, risks and benefits discussed, surgical consent, monitors and equipment checked, pre-op evaluation, timeout performed and anesthesia consent ?Lidocaine 1% used for infiltration ?Left, radial was placed ?Catheter size: 20 G ?Hand hygiene performed , maximum sterile barriers used  and Seldinger technique used ?Allen's test indicative of satisfactory collateral circulation ?Attempts: 1 ?Procedure performed without using ultrasound guided technique. ?Following insertion, dressing applied and Biopatch. ?Post procedure assessment: normal and unchanged ? ? ? ?

## 2022-02-28 NOTE — Anesthesia Procedure Notes (Signed)
Procedure Name: Intubation ?Date/Time: 02/28/2022 7:47 AM ?Performed by: Reece Agar, CRNA ?Pre-anesthesia Checklist: Patient identified, Emergency Drugs available, Suction available and Patient being monitored ?Patient Re-evaluated:Patient Re-evaluated prior to induction ?Oxygen Delivery Method: Circle System Utilized ?Preoxygenation: Pre-oxygenation with 100% oxygen ?Induction Type: IV induction ?Ventilation: Mask ventilation without difficulty and Oral airway inserted - appropriate to patient size ?Laryngoscope Size: Mac and 4 ?Grade View: Grade I ?Tube type: Oral ?Tube size: 7.5 mm ?Number of attempts: 1 ?Airway Equipment and Method: Stylet and Oral airway ?Placement Confirmation: ETT inserted through vocal cords under direct vision, positive ETCO2 and breath sounds checked- equal and bilateral ?Secured at: 23 cm ?Tube secured with: Tape ?Dental Injury: Teeth and Oropharynx as per pre-operative assessment  ? ? ? ? ?

## 2022-03-01 ENCOUNTER — Encounter (HOSPITAL_COMMUNITY): Payer: Self-pay | Admitting: Neurological Surgery

## 2022-03-01 LAB — GLUCOSE, CAPILLARY
Glucose-Capillary: 143 mg/dL — ABNORMAL HIGH (ref 70–99)
Glucose-Capillary: 222 mg/dL — ABNORMAL HIGH (ref 70–99)

## 2022-03-01 MED ORDER — TAMSULOSIN HCL 0.4 MG PO CAPS: 0.4000 mg | ORAL_CAPSULE | Freq: Every day | ORAL | 0 refills | Status: AC

## 2022-03-01 MED ORDER — TAMSULOSIN HCL 0.4 MG PO CAPS
0.4000 mg | ORAL_CAPSULE | Freq: Every day | ORAL | Status: DC
  Administered 2022-03-01: 0.4 mg via ORAL
  Filled 2022-03-01: qty 1

## 2022-03-01 NOTE — Discharge Instructions (Signed)
Craniotomy °Care After °Please read the instructions outlined below and refer to this sheet in the next few weeks. These discharge instructions provide you with general information on caring for yourself after you leave the hospital. Your surgeon may also give you specific instructions. While your treatment has been planned according to the most current medical practices available, unavoidable complications occasionally occur. If you have any problems or questions after discharge, please call your surgeon. °Although there are many types of brain surgery, recovery following craniotomy (surgical opening of the skull) is much the same for each. However, recovery depends on many factors. These include the type and severity of brain injury and the type of surgery. It also depends on any nervous system function problems (neurological deficits) before surgery. If the craniotomy was done for cancer, chemotherapy and radiation could follow. You could be in the hospital from 5 days to a couple weeks. This depends on the type of surgery, findings, and whether there are complications. °HOME CARE INSTRUCTIONS  °· It is not unusual to hear a clicking noise after a craniotomy, the plates and screws used to attach the bone flap can sometimes cause this. It is a normal occurrence if this does happen °· Do not drive for 10 days after the operation °· Your scalp may feel spongy for a while, because of fluid under it. This will gradually get better. Occasionally, the surgeon will not replace the bone that was removed to access the brain. If there is a bony defect, the surgeon will ask you to wear a helmet for protection. This is a discussion you should have with your surgeon prior to leaving the hospital (discharge). °· Numbness may persist in some areas of your scalp. °· Take all medications as directed. Sometimes steroids to control swelling are prescribed. Anticonvulsants to prevent seizures may also be given. Do not use alcohol,  other drugs, or medications unless your surgeon says it is OK. °· Keep the wound dry and clean. The wound may be washed gently with soap and water. Then, you may gently blot or dab it dry, without rubbing. Do not take baths, use swimming pools or hot tubs for 10 days, or as instructed by your caregiver. It is best to wait to see you surgeon at your first postoperative visit, and to get directions at that time. °· Only take over-the-counter or prescription medicines for pain, discomfort, or fever as directed by your caregiver. °· You may continue your normal diet, as directed. °· Walking is OK for exercise. Wait at least 3 months before you return to mild, non-contact sports or as your surgeon suggests. Contact sports should be avoided for at least 1 year, unless your surgeon says it is OK. °· If you are prescribed steroids, take them exactly as prescribed. If you start having a decrease in nervous system functions (neurological deficits) and headaches as the dose of steroids is reduced, tell your surgeon right away. °· When the anticonvulsant prescription is finished you no longer need to take it. °SEEK IMMEDIATE MEDICAL CARE IF:  °· You develop nausea, vomiting, severe headaches, confusion, or you have a seizure. °· You develop chest pain, a stiff neck, or difficulty breathing. °· There is redness, swelling, or increasing pain in the wound or pin insertion sites. °· You have an increase in swelling or bruising around the eyes. °· There is drainage or pus coming from the wound. °· You have an oral temperature above 102° F (38.9° C), not controlled by medicine. °·   You notice a foul smell coming from the wound or dressing. °· The wound breaks open (edges not staying together) after the stitches have been removed. °· You develop dizziness or fainting while standing. °· You develop a rash. °· You develop any reaction or side effects to the medications given. °Document Released: 03/10/2006 Document Revised: 03/01/2012  Document Reviewed: 12/17/2009 °[employer]® Patient Information ©2013 [employer]. ° °

## 2022-03-01 NOTE — Progress Notes (Signed)
Patient was taken to the restroom and successfully empted his bladder. Discharge instruction covered and all questions answered. IV,s removed patient tolerated we. All belongings sent home with patient and son [name]. Patient take to personal car via wheelchair.  ?

## 2022-03-01 NOTE — Evaluation (Signed)
Occupational Therapy Evaluation ?Patient Details ?Name: Zachary Spence ?MRN: [id number] ?DOB: February 25, 1923 ?Today's Date: 03/01/2022 ? ? ?History of Present Illness 88 y.o. 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 Pt underwent brain biopsy on 3/10. PMH includes DMII, HTN, non-hodgkin's lymphoma, CVA.  ? ?Clinical Impression ?  ?Patient evaluated by Occupational Therapy with no further acute OT needs identified. All education has been completed and the patient has no further questions. Pt demonstrates balance deficits, and questionable mild field deficits Lt lower quadrant.  He is able to complete ADLs with supervision.  Anticipate he will progress quickly to mod I with RW.  He lives with his son, and was mod I with ADLs PTA, he does not drive, and son assists with medication management.   See below for any follow-up Occupational Therapy or equipment needs. OT is signing off. Thank you for this referral. ? ?  ?   ? ?Recommendations for follow up therapy are one component of a multi-disciplinary discharge planning process, led by the attending physician.  Recommendations may be updated based on patient status, additional functional criteria and insurance authorization.  ? ?Follow Up Recommendations ? No OT follow up  ?  ?Assistance Recommended at Discharge Intermittent Supervision/Assistance  ?Patient can return home with the following Assistance with cooking/housework;Assist for transportation;Help with stairs or ramp for entrance (supervision initially) ? ?  ?Functional Status Assessment ? Patient has had a recent decline in their functional status and demonstrates the ability to make significant improvements in function in a reasonable and predictable amount of time.  ?Equipment Recommendations ? None recommended by OT  ?  ?Recommendations for Other Services   ? ? ?  ?Precautions / Restrictions Precautions ?Precautions: Fall ?Precaution Comments: 3 falls  in the last 3 months  ? ?  ? ?Mobility Bed Mobility ?Overal bed mobility: Independent ?  ?  ?  ?  ?  ?  ?  ?  ? ?Transfers ?Overall transfer level: Needs assistance ?Equipment used: Rolling walker (2 wheels) ?Transfers: Sit to/from Stand, Bed to chair/wheelchair/BSC ?Sit to Stand: Supervision ?  ?  ?Step pivot transfers: Supervision ?  ? Lateral/Scoot Transfers: Supervision ?  ?  ? ?  ?Balance Overall balance assessment: Needs assistance ?Sitting-balance support: Feet supported ?Sitting balance-Leahy Scale: Good ?  ?  ?Standing balance support: No upper extremity supported ?Standing balance-Leahy Scale: Fair ?  ?  ?  ?  ?  ?  ?  ?  ?  ?  ?  ?  ?   ? ?ADL either performed or assessed with clinical judgement  ? ?ADL Overall ADL's : At baseline ?  ?  ?  ?  ?  ?  ?  ?  ?  ?  ?  ?  ?  ?  ?  ?  ?  ?  ?  ?General ADL Comments: Pt appears to be at, or close to his baseline.  He requires supervision for ADLs currently, but should quickly progress to mod I  ? ? ? ?Vision Baseline Vision/History: 1 Wears glasses ?Ability to See in Adequate Light: 2 Moderately impaired ?Patient Visual Report: Central vision impairment ?Vision Assessment?: Yes ?Eye Alignment: Within Functional Limits ?Ocular Range of Motion: Within Functional Limits ?Alignment/Gaze Preference: Within Defined Limits ?Tracking/Visual Pursuits: Able to track stimulus in all quads without difficulty ?Visual Fields: Left visual field deficit ?Additional Comments: Pt with inconsistent responses to stimuli in Lt inferior quadrant  ?   ?  Perception   ?  ?Praxis   ?  ? ?Pertinent Vitals/Pain Pain Assessment ?Pain Assessment: No/denies pain  ? ? ? ?Hand Dominance Right ?  ?Extremity/Trunk Assessment Upper Extremity Assessment ?Upper Extremity Assessment: RUE deficits/detail;LUE deficits/detail ?RUE Deficits / Details: Pt reports h/o Carpal tunnel with residual sensory impairment bil. hands ?RUE Coordination: decreased fine motor ?LUE Coordination: decreased fine motor ?   ?Lower Extremity Assessment ?Lower Extremity Assessment: Defer to PT evaluation ?  ?Cervical / Trunk Assessment ?Cervical / Trunk Assessment: Kyphotic ?  ?Communication Communication ?Communication: HOH ?  ?Cognition Arousal/Alertness: Awake/alert ?Behavior During Therapy: [facility] for tasks assessed/performed ?Overall Cognitive Status: Within Functional Limits for tasks assessed ?  ?  ?  ?  ?  ?  ?  ?  ?  ?  ?  ?  ?  ?  ?  ?  ?  ?  ?  ?General Comments    ? ?  ?Exercises   ?  ?Shoulder Instructions    ? ? ?Home Living Family/patient expects to be discharged to:: Private residence ?Living Arrangements: Children ?Available Help at Discharge: Family;Available 24 hours/day ?Type of Home: House ?Home Access: Stairs to enter ?Entrance Stairs-Number of Steps: 1 + 1 ?Entrance Stairs-Rails: None ?Home Layout: One level ?  ?  ?Bathroom Shower/Tub: Walk-in shower ?  ?Bathroom Toilet: Standard ?Bathroom Accessibility: Yes ?  ?Home Equipment: Cane - single Barista (2 wheels) ?  ?  ?  ? ?  ?Prior Functioning/Environment Prior Level of Function : Independent/Modified Independent ?  ?  ?  ?  ?  ?  ?Mobility Comments: pt ambulates with RW, utilizes a cane in the gym as it is easier to maneuver. Enjoys working out ?ADLs Comments: pt cooks microwavable meals, bathes and dresses independently.  Pt does not drive.  Son assists pt setting up pill box weekly ?  ? ?  ?  ?OT Problem List: Decreased activity tolerance;Impaired balance (sitting and/or standing);Impaired vision/perception ?  ?   ?OT Treatment/Interventions:    ?  ?OT Goals(Current goals can be found in the care plan section) Acute Rehab OT Goals ?Patient Stated Goal: to get back to the gym ?OT Goal Formulation: All assessment and education complete, DC therapy  ?OT Frequency:   ?  ? ?Co-evaluation   ?  ?  ?  ?  ? ?  ?AM-PAC OT "6 Clicks" Daily Activity     ?Outcome Measure Help from another person eating meals?: None ?Help from another person taking care of personal  grooming?: A Little ?Help from another person toileting, which includes using toliet, bedpan, or urinal?: A Little ?Help from another person bathing (including washing, rinsing, drying)?: A Little ?Help from another person to put on and taking off regular upper body clothing?: A Little ?Help from another person to put on and taking off regular lower body clothing?: A Little ?6 Click Score: 19 ?  ?End of Session Equipment Utilized During Treatment: Rolling walker (2 wheels) ?Nurse Communication: Mobility status ? ?Activity Tolerance: Patient tolerated treatment well ?Patient left: in bed;with call bell/phone within reach ? ?OT Visit Diagnosis: Unsteadiness on feet (R26.81)  ?              ?Time: 6767-2094 ?OT Time Calculation (min): 24 min ?Charges:  OT General Charges ?$OT Visit: 1 Visit ?OT Evaluation ?$OT Eval Moderate Complexity: 1 Mod ?OT Treatments ?$Self Care/Home Management : 8-22 mins ? ?Adrijana Haros C., OTR/L ?Acute Rehabilitation Services ?Pager [phone] ?Office [phone] ? ? ?Daveah Varone M ?  03/01/2022, 10:36 AM ?

## 2022-03-01 NOTE — Discharge Summary (Signed)
Physician Discharge Summary  ?Patient ID: ?Zachary Spence ?MRN: [id number] ?DOB/AGE: 1923-06-13 88 y.o. ? ?Admit date: 02/28/2022 ?Discharge date: 03/01/2022 ? ?Admission Diagnoses:Brain tumor ? ?Discharge Diagnoses:  ?Principal Problem: ?  Primary brain tumor ([place]) ?Active Problems: ?  Brain tumor ([place]) ? ? ?Discharged Condition: good ? ?Hospital Course: Mr. Janowiak was admitted and taken to the operating room for an uncomplicated stereotactic brain biopsy. He has done well post op. Repeat ct was benign. He is eating, ambulating and voiding. Wound is clean, dry, no signs of infection.  ? ?Treatments: surgery: Stereotactic brain biopsy ? ?Discharge Exam: ?Blood pressure 114/66, pulse 72, temperature 97.6 ?F (36.4 ?C), temperature source Oral, resp. rate 14, height '5\' 11"'$  (1.803 m), weight 83 kg, SpO2 96 %. ?General appearance: alert, cooperative, appears stated age, and no distress ? ?Disposition: Discharge disposition: 01-Home or Self Care ? ? ? ? ? ?Brain tumor ? ?Allergies as of 03/01/2022   ? ?   Reactions  ? Erythromycin Swelling  ? Swelling of hands  ? ?  ? ?  ?Medication List  ?  ? ?TAKE these medications   ? ?acetaminophen 325 MG tablet ?Commonly known as: TYLENOL ?Take 2 tablets (650 mg total) by mouth every 4 (four) hours as needed for mild pain (or temp > 37.5 C (99.5 F)). ?  ?ASPIRIN 81 PO ?Take 81 mg by mouth daily. ?  ?calcium carbonate 1250 (500 Ca) MG tablet ?Commonly known as: OS-CAL - dosed in mg of elemental calcium ?Take 1 tablet (1,250 mg total) by mouth daily with breakfast. ?  ?Fish Oil 1000 MG Caps ?Take 1,000 mg by mouth daily. ?  ?folic acid 1 MG tablet ?Commonly known as: FOLVITE ?Take 1 tablet (1 mg total) by mouth daily. ?  ?glipiZIDE 5 MG tablet ?Commonly known as: GLUCOTROL ?Take 1 tablet (5 mg total) by mouth daily. ?What changed: how much to take ?  ?metoprolol succinate 25 MG 24 hr tablet ?Commonly known as: Toprol XL ?Take 0.5 tablets (12.5 mg total) by mouth daily. ?What changed: when  to take this ?  ?multivitamin with minerals Tabs tablet ?Take 1 tablet by mouth daily. ?  ?PRESERVISION AREDS 2 PO ?Take 1 capsule by mouth 2 (two) times daily. ?  ?Prevagen 10 MG Caps ?Generic drug: Apoaequorin ?Take 10 mg by mouth daily. ?  ?rosuvastatin 5 MG tablet ?Commonly known as: CRESTOR ?TAKE 1 TABLET BY MOUTH  DAILY AT 6 PM. ?What changed: when to take this ?  ?sodium chloride 0.65 % Soln nasal spray ?Commonly known as: OCEAN ?Place 1 spray into both nostrils daily at 12 noon. ?  ?tamsulosin 0.4 MG Caps capsule ?Commonly known as: FLOMAX ?Take 1 capsule (0.4 mg total) by mouth daily for 5 days. ?  ?vitamin B-12 500 MCG tablet ?Commonly known as: CYANOCOBALAMIN ?Take 500 mcg by mouth daily. ?  ? ?  ? ? Follow-up Information   ? ? Judith Part, MD Follow up.   ?Specialty: Neurosurgery ?Why: keep your scheduled appointment ?Contact information: ?[place] [id number] ?[phone] ? ? ?  ?  ? ?  ?  ? ?  ? ? ?Signed: ?Ashok Pall ?03/01/2022, 10:20 AM ?  ? ?

## 2022-03-03 ENCOUNTER — Inpatient Hospital Stay: Payer: Medicare Other

## 2022-03-10 ENCOUNTER — Encounter (HOSPITAL_COMMUNITY): Payer: Self-pay | Admitting: Neurological Surgery

## 2022-03-10 ENCOUNTER — Inpatient Hospital Stay: Payer: Medicare Other

## 2022-03-10 LAB — SURGICAL PATHOLOGY

## 2022-03-14 ENCOUNTER — Telehealth: Payer: Self-pay | Admitting: Internal Medicine

## 2022-03-14 NOTE — Telephone Encounter (Signed)
.  Called pt per 3/23 inbasket , Patient was unavailable, a message with appt time and date was left with son number on file.   ?

## 2022-03-17 ENCOUNTER — Other Ambulatory Visit: Payer: Self-pay

## 2022-03-17 ENCOUNTER — Ambulatory Visit: Payer: Medicare Other | Admitting: Cardiovascular Disease

## 2022-03-17 ENCOUNTER — Inpatient Hospital Stay: Payer: Medicare Other | Attending: Internal Medicine

## 2022-03-17 ENCOUNTER — Encounter: Payer: Self-pay | Admitting: Cardiovascular Disease

## 2022-03-17 VITALS — BP 94/56 | HR 69 | Ht 71.0 in | Wt 180.4 lb

## 2022-03-17 DIAGNOSIS — I2510 Atherosclerotic heart disease of native coronary artery without angina pectoris: Secondary | ICD-10-CM

## 2022-03-17 DIAGNOSIS — I350 Nonrheumatic aortic (valve) stenosis: Secondary | ICD-10-CM | POA: Diagnosis not present

## 2022-03-17 DIAGNOSIS — C719 Malignant neoplasm of brain, unspecified: Secondary | ICD-10-CM

## 2022-03-17 DIAGNOSIS — Z951 Presence of aortocoronary bypass graft: Secondary | ICD-10-CM

## 2022-03-17 DIAGNOSIS — C713 Malignant neoplasm of parietal lobe: Secondary | ICD-10-CM | POA: Insufficient documentation

## 2022-03-17 DIAGNOSIS — E785 Hyperlipidemia, unspecified: Secondary | ICD-10-CM

## 2022-03-17 MED ORDER — METOPROLOL SUCCINATE ER 25 MG PO TB24: 12.5000 mg | ORAL_TABLET | ORAL | 6 refills | Status: AC

## 2022-03-17 NOTE — Progress Notes (Signed)
? ?Cardiology Office Note   ? ?Date:  03/17/2022  ? ?ID:  Zachary Spence, DOB Sep 08, 1923, MRN [id number] ? ?PCP:  Merrilee Seashore, MD  ?Cardiologist:  Shelva Majestic, MD  ? ?24-monthF/U cardiology evaluation ? ?History of Present Illness:  ?Zachary KOTCHis a 88y.o. male who established cardiology care with me in August 2020.  I last saw him in April 2022.  He presents for an 180-monthollow-up evaluation. ? ?Mr. SkGuevaraas known coronary artery disease and underwent CABG revascularization surgery x5 in 1903-20-99 He has been followed by [facility]is [facility].  I do not have any of the records from the [facility].  Mr. scales has a history of type 2 diabetes mellitus as well as hyperlipidemia.  He suffered a lacunar stroke in October 2019 and an MRI at that time showed a small focus of acute ischemia within the posterior left corona radiata.  He also had findings of chronic small vessel ischemia including multiple old lacunar infarcts.  He is recovering well with minimal weakness on the right side.  He has had some mild neuropathy.  He has left foot drop and continues to wear a foot brace.  [place]  He had undergone an echo Doppler study in October 2019 which showed an EF of 55 to 60% and there was evidence for mild left ventricular hypertrophy.  His aortic valve was thickened and there was evidence for mild aortic stenosis with a mean gradient of 13 and peak gradient 24.  There was evidence for mitral annular calcification.  [facility] him he denied  any episodes of chest pain or tightness, presyncope or syncope.  He was unaware of any significant dyspnea.   ? ?During his initial evaluation with me, physical examination disclosed a 2/6 systolic murmur compatible with his previously documented aortic valve stenosis.  I recommended a follow-up echo  Doppler evaluation which was completed on February 06, 2020.  This revealed EF 55 to 60% with mild LVH and grade 1 diastolic dysfunction.  His aortic valve was tricuspid.  There was mild aortic insufficiency.  It was suspected most likely that he had moderate aortic stenosis although his mean gradient was only 15 mm with a peak gradient of 28.7.  Aortic valve area by VTI was 1.25 cm?. ? ?I saw Mr. Skills on February 14, 2020 and over the prior 6 months he remained stable without chest pain or shortness of breath.  [place] 202007-03-20nd is now living with his son Zachary Spence He uses a cane to walk outside and a walker to walk at his home.  He does have left foot drop for which he uses a left foot brace.  He continues to be on Toprol-XL 12.5 mg daily.  He is on rosuvastatin 5 mg.  He is diabetic on glipizide 5 mg.   ? ?I last saw him in April 08, 2021 at which time he continued to be stable fro5 days/week and typically goes to the gym where he walks on a treadmill and does some resistance exercises with barbells.  He denies exertional dyspnea.  He admits to occasional ankle swelling.  He was unaware of palpitations, presyncope or syncope.  I discussed the future follow-up echo Doppler study for reassessment  of his aortic stenosis. ? ?He underwent a follow-up echo Doppler study on October 02, 2021 which revealed normal LV function with EF 60 to 65%, mild LVH, and grade 1 diastolic dysfunction.  There was moderate aortic valve stenosis with a mean gradient of 18.8 and a peak gradient of 31.2.  Estimated aortic valve area is 1.27 cm? but visually the excursion appeared more significantly reduced. ? ?Since I last saw him, he had presented to the emergency room on February 08, 2022 after a fall.  CT of his head and spine were negative for acute traumatic pathology.  However there was concern about increased hypodensity in the white matter of his right parietal lobe  and an MRI was recommended for further evaluation.  Unfortunately, an MRI showed widespread abnormal signal in the bilateral brain concerning for diffuse glioma with superimposed malignant enhancement on the right suggestive of high-grade glioblastoma.  He subsequently underwent a biopsy which confirmed grade 4 glioblastoma.  This has been affecting his vision with visual dimness.  He denies any headaches or seizures.  He has been evaluated by Dr. Venetia Constable and is to see a neuro oncologist.  He denies any chest pain palpitations or shortness of breath.  He presents for evaluation ? ? ?Past Medical History:  ?Diagnosis Date  ? Bowel obstruction ([place])   ? Diabetes mellitus without complication (Zachary Spence)   ? Hypertension   ? Non Hodgkin's lymphoma ([place])   ? Stroke Zachary Spence)   ? ? ?Past Surgical History:  ?Procedure Laterality Date  ? APPENDECTOMY    ? APPLICATION OF CRANIAL NAVIGATION N/A 02/28/2022  ? Procedure: APPLICATION OF CRANIAL NAVIGATION;  Surgeon: Judith Part, MD;  Location: [facility];  Service: Neurosurgery;  Laterality: N/A;  ? CORONARY ARTERY BYPASS GRAFT    ? FRAMELESS  BIOPSY WITH BRAINLAB Right 02/28/2022  ? Procedure: Right Stereotactic brain biopsy with brainlab;  Surgeon: Judith Part, MD;  Location: [facility];  Service: Neurosurgery;  Laterality: Right;  ? GALLBLADDER SURGERY    ? SPLENECTOMY    ? ? ?Current Medications: ?Outpatient Medications Prior to Visit  ?Medication Sig Dispense Refill  ? acetaminophen (TYLENOL) 325 MG tablet Take 2 tablets (650 mg total) by mouth every 4 (four) hours as needed for mild pain (or temp > 37.5 C (99.5 F)).    ? Apoaequorin (PREVAGEN) 10 MG CAPS Take 10 mg by mouth daily.    ? calcium carbonate (OS-CAL - DOSED IN MG OF ELEMENTAL CALCIUM) 1250 (500 Ca) MG tablet Take 1 tablet (1,250 mg total) by mouth daily with breakfast. 30 tablet 1  ? folic acid (FOLVITE) 1 MG tablet Take 1 tablet (1 mg total) by mouth daily. 30 tablet 1  ? glipiZIDE (GLUCOTROL) 5 MG tablet Take 1  tablet (5 mg total) by mouth daily. (Patient taking differently: Take 2.5 mg by mouth daily.) 30 tablet 1  ? metoprolol succinate (TOPROL XL) 25 MG 24 hr tablet Take 0.5 tablets (12.5 mg total) by mouth daily. (Patient taking differently: Take 12.5 mg by mouth at bedtime.) 45 tablet 3  ? Multiple Vitamin (MULTIVITAMIN WITH MINERALS) TABS tablet Take 1 tablet by mouth daily.    ? Multiple Vitamins-Minerals (PRESERVISION AREDS 2 PO) Take 1 capsule by mouth 2 (two) times daily.    ? Omega-3 Fatty Acids (FISH OIL) 1000 MG CAPS Take 1,000 mg by mouth daily.     ? rosuvastatin (CRESTOR) 5 MG tablet TAKE 1 TABLET BY MOUTH  DAILY AT 6 PM. (Patient taking  differently: Take 5 mg by mouth at bedtime.) 90 tablet 3  ? sodium chloride (OCEAN) 0.65 % SOLN nasal spray Place 1 spray into both nostrils daily at 12 noon.    ? vitamin B-12 (CYANOCOBALAMIN) 500 MCG tablet Take 500 mcg by mouth daily.    ? ASPIRIN 81 PO Take 81 mg by mouth daily. (Patient not taking: Reported on 03/17/2022)    ? ?No facility-administered medications prior to visit.  ?  ? ?Allergies:   Erythromycin  ? ?Social History  ? ?Socioeconomic History  ? Marital status: Widowed  ?  Spouse name: Not on file  ? Number of children: Not on file  ? Years of education: Not on file  ? Highest education level: Not on file  ?Occupational History  ? Not on file  ?Tobacco Use  ? Smoking status: Former  ?  Passive exposure: Past  ? Smokeless tobacco: Never  ?Vaping Use  ? Vaping Use: Never used  ?Substance and Sexual Activity  ? Alcohol use: No  ?  Alcohol/week: 0.0 standard drinks  ? Drug use: Not Currently  ? Sexual activity: Not on file  ?Other Topics Concern  ? Not on file  ?Social History Narrative  ? Not on file  ? ?Social Determinants of Health  ? ?Financial Resource Strain: Not on file  ?Food Insecurity: Not on file  ?Transportation Needs: Not on file  ?Physical Activity: Not on file  ?Stress: Not on file  ?Social Connections: Not on file  ?  ?Social history is notable  in that he was born in [place] and raised in [place].  He has been in [place] since 1941.  He had worked for [employer] for 37 years.  He retired at age [age].  He is widowed since

## 2022-03-17 NOTE — Patient Instructions (Addendum)
Medication Instructions:  ?DECREASE the Metoprolol Succinate to 12.5 mg every other day. If your systolic blood pressure (the top number) stays less then 95, then please cal the office. We may discontinue the medication. ? ?*If you need a refill on your cardiac medications before your next appointment, please call your pharmacy* ? ? ?Lab Work: ?None ordered ?If you have labs (blood work) drawn today and your tests are completely normal, you will receive your results only by: ?MyChart Message (if you have MyChart) OR ?A paper copy in the mail ?If you have any lab test that is abnormal or we need to change your treatment, we will call you to review the results. ? ? ?Testing/Procedures: ?None ordered ? ? ?Follow-Up: ?At [facility], you and your health needs are our priority.  As part of our continuing mission to provide you with exceptional heart care, we have created designated Provider Care Teams.  These Care Teams include your primary Cardiologist (physician) and Advanced Practice Providers (APPs -  Physician Assistants and Nurse Practitioners) who all work together to provide you with the care you need, when you need it. ? ?We recommend signing up for the patient portal called "MyChart".  Sign up information is provided on this After Visit Summary.  MyChart is used to connect with patients for Virtual Visits (Telemedicine).  Patients are able to view lab/test results, encounter notes, upcoming appointments, etc.  Non-urgent messages can be sent to your provider as well.   ?To learn more about what you can do with MyChart, go to [email or web address].   ? ?Your next appointment:   ?6 month(s) ? ?The format for your next appointment:   ?In Person ? ?Provider:   ?Shelva Majestic, MD { ? ? ?

## 2022-03-20 ENCOUNTER — Other Ambulatory Visit: Payer: Self-pay

## 2022-03-20 ENCOUNTER — Inpatient Hospital Stay: Payer: Medicare Other | Admitting: Internal Medicine

## 2022-03-20 VITALS — BP 101/56 | HR 76 | Temp 97.9°F | Resp 18 | Wt 180.2 lb

## 2022-03-20 DIAGNOSIS — C719 Malignant neoplasm of brain, unspecified: Secondary | ICD-10-CM | POA: Diagnosis not present

## 2022-03-20 DIAGNOSIS — C713 Malignant neoplasm of parietal lobe: Secondary | ICD-10-CM | POA: Diagnosis not present

## 2022-03-20 MED ORDER — DEXAMETHASONE 4 MG PO TABS: 4.0000 mg | ORAL_TABLET | Freq: Every day | ORAL | 1 refills | Status: AC

## 2022-03-20 NOTE — Progress Notes (Signed)
? ?[facility] [address]  ?[place], [place] [address] ?[phone] ? ? ?Interval Evaluation ? ?Date of Service: 03/20/22 ?Patient Name: Zachary Spence ?Patient MRN: [id number] ?Patient DOB: 1923/03/03 ?Provider: Ventura Sellers, MD ? ?Identifying Statement:  ?LAZARIUS RIVKIN is a 88 y.o. male with right parietal glioblastoma  ? ?Oncologic History: ?Oncology History  ?Glioblastoma, IDH-wildtype ([place])  ?02/28/2022 Initial Diagnosis  ? Glioblastoma, IDH-wildtype ([place]) ?  ?02/28/2022 Surgery  ? Stereotactic biopsy with Dr. Zada Finders; path is Glioblastoma IDHwt ?  ? ? ?Biomarkers: ? ?MGMT Unknown.  ?IDH 1/2 Unknown.  ?EGFR Unknown  ?TERT Unknown  ? ?Interval History: ?IVAL PACER presents today for follow up after biopsy, pathology report.  Son reports functional decline over the past several weeks.  He is increasingly having difficulty navigating spaces, including finding bathroom in his home.  He is relying on son for dressing, bathing, toileting, cooking.  He feeds himself.  He still enjoys watching sports on TV and spending time with his grandchildren.  No seizures or headaches. ? ?H+P (02/11/22) Patient presented to medical attention this past weekend, after fall at home led to trauma workup, head imaging.  MRI brain there demonstrated an enhancing and infiltrative brain mass, consistent with primary CNS neoplasm.  He was discharged to home after visiting with neurosurgery Sunday.  Although it's not clear what led to the initial fall, there was a second fall yesterday, which was from him "missing his chair" when sitting down.  His son, present today, describes impairments in short term memory, conversational recall, in recent weeks.  He is otherwise able to care for most of his own needs, currently walks with a walker.  Strongly denies any history of seizures. ? ?Medications: ?Current Outpatient Medications on File Prior to Visit  ?Medication Sig Dispense Refill  ?  acetaminophen (TYLENOL) 325 MG tablet Take 2 tablets (650 mg total) by mouth every 4 (four) hours as needed for mild pain (or temp > 37.5 C (99.5 F)).    ? Apoaequorin (PREVAGEN) 10 MG CAPS Take 10 mg by mouth daily.    ? calcium carbonate (OS-CAL - DOSED IN MG OF ELEMENTAL CALCIUM) 1250 (500 Ca) MG tablet Take 1 tablet (1,250 mg total) by mouth daily with breakfast. 30 tablet 1  ? folic acid (FOLVITE) 1 MG tablet Take 1 tablet (1 mg total) by mouth daily. 30 tablet 1  ? glipiZIDE (GLUCOTROL) 5 MG tablet Take 1 tablet (5 mg total) by mouth daily. (Patient taking differently: Take 2.5 mg by mouth daily.) 30 tablet 1  ? metoprolol succinate (TOPROL XL) 25 MG 24 hr tablet Take 0.5 tablets (12.5 mg total) by mouth every other day. 10 tablet 6  ? Multiple Vitamin (MULTIVITAMIN WITH MINERALS) TABS tablet Take 1 tablet by mouth daily.    ? Multiple Vitamins-Minerals (PRESERVISION AREDS 2 PO) Take 1 capsule by mouth 2 (two) times daily.    ? Omega-3 Fatty Acids (FISH OIL) 1000 MG CAPS Take 1,000 mg by mouth daily.     ? rosuvastatin (CRESTOR) 5 MG tablet TAKE 1 TABLET BY MOUTH  DAILY AT 6 PM. (Patient taking differently: Take 5 mg by mouth at bedtime.) 90 tablet 3  ? sodium chloride (OCEAN) 0.65 % SOLN nasal spray Place 1 spray into both nostrils daily at 12 noon.    ? vitamin B-12 (CYANOCOBALAMIN) 500 MCG tablet Take 500 mcg by mouth daily.    ? ?No current facility-administered medications on file  prior to visit.  ? ? ?Allergies:  ?Allergies  ?Allergen Reactions  ? Erythromycin Swelling  ?  Swelling of hands  ? ?Past Medical History:  ?Past Medical History:  ?Diagnosis Date  ? Bowel obstruction ([place])   ? Diabetes mellitus without complication ([place])   ? Hypertension   ? Non Hodgkin's lymphoma ([place])   ? Stroke [facility])   ? ?Past Surgical History:  ?Past Surgical History:  ?Procedure Laterality Date  ? APPENDECTOMY    ? APPLICATION OF CRANIAL NAVIGATION N/A 02/28/2022  ? Procedure: APPLICATION OF CRANIAL NAVIGATION;  Surgeon:  Judith Part, MD;  Location: [facility];  Service: Neurosurgery;  Laterality: N/A;  ? CORONARY ARTERY BYPASS GRAFT    ? FRAMELESS  BIOPSY WITH BRAINLAB Right 02/28/2022  ? Procedure: Right Stereotactic brain biopsy with brainlab;  Surgeon: Judith Part, MD;  Location: [facility];  Service: Neurosurgery;  Laterality: Right;  ? GALLBLADDER SURGERY    ? SPLENECTOMY    ? ?Social History:  ?Social History  ? ?Socioeconomic History  ? Marital status: Widowed  ?  Spouse name: Not on file  ? Number of children: Not on file  ? Years of education: Not on file  ? Highest education level: Not on file  ?Occupational History  ? Not on file  ?Tobacco Use  ? Smoking status: Former  ?  Passive exposure: Past  ? Smokeless tobacco: Never  ?Vaping Use  ? Vaping Use: Never used  ?Substance and Sexual Activity  ? Alcohol use: No  ?  Alcohol/week: 0.0 standard drinks  ? Drug use: Not Currently  ? Sexual activity: Not on file  ?Other Topics Concern  ? Not on file  ?Social History Narrative  ? Not on file  ? ?Social Determinants of Health  ? ?Financial Resource Strain: Not on file  ?Food Insecurity: Not on file  ?Transportation Needs: Not on file  ?Physical Activity: Not on file  ?Stress: Not on file  ?Social Connections: Not on file  ?Intimate Partner Violence: Not on file  ? ?Family History: No family history on file. ? ?Review of Systems: ?Constitutional: Doesn't report fevers, chills or abnormal weight loss ?Eyes: Doesn't report blurriness of vision ?Ears, nose, mouth, throat, and face: Doesn't report sore throat ?Respiratory: Doesn't report cough, dyspnea or wheezes ?Cardiovascular: Doesn't report palpitation, chest discomfort  ?Gastrointestinal:  Doesn't report nausea, constipation, diarrhea ?GU: Doesn't report incontinence ?Skin: Doesn't report skin rashes ?Neurological: Per HPI ?Musculoskeletal: Doesn't report joint pain ?Behavioral/Psych: Doesn't report anxiety ? ?Physical Exam: ?Vitals:  ? 03/20/22 1000  ?BP: (!) 101/56   ?Pulse: 76  ?Resp: 18  ?Temp: 97.9 ?F (36.6 ?C)  ?SpO2: 99%  ? ?KPS: 60. ?General: Alert, cooperative, pleasant, in no acute distress ?Head: Normal ?EENT: No conjunctival injection or scleral icterus.  ?Lungs: Resp effort normal ?Cardiac: Regular rate ?Abdomen: Non-distended abdomen ?Skin: No rashes cyanosis or petechiae. ?Extremities: No clubbing or edema ? ?Neurologic Exam: ?Mental Status: Awake, alert, attentive to examiner. Oriented to self and environment. Language is fluent with intact comprehension.  ?Cranial Nerves: Visual acuity is grossly normal. Left hemianopia. Extra-ocular movements intact. No ptosis. Face is symmetric ?Motor: Tone and bulk are normal. Power is full in both arms and legs. Reflexes are symmetric, no pathologic reflexes present.  ?Sensory: Intact to light touch ?Gait: Orthopedic limitations ? ? ?Labs: ?I have reviewed the data as listed ?   ?Component Value Date/Time  ? NA 137 02/25/2022 1119  ? NA 138 04/18/2015 1333  ? K  4.3 02/25/2022 1119  ? K 4.0 04/18/2015 1333  ? CL 103 02/25/2022 1119  ? CL 106 04/18/2015 1333  ? CO2 26 02/25/2022 1119  ? CO2 27 04/18/2015 1333  ? GLUCOSE 116 (H) 02/25/2022 1119  ? GLUCOSE 93 04/18/2015 1333  ? BUN 23 02/25/2022 1119  ? BUN 20 04/18/2015 1333  ? CREATININE 1.04 02/28/2022 1046  ? CREATININE 1.05 04/18/2015 1333  ? CALCIUM 9.4 02/25/2022 1119  ? CALCIUM 8.8 (L) 04/18/2015 1333  ? PROT 7.1 10/20/2018 0612  ? PROT 7.3 04/18/2015 1333  ? ALBUMIN 3.9 10/20/2018 0612  ? ALBUMIN 4.2 04/18/2015 1333  ? AST 23 10/20/2018 0612  ? AST 28 04/18/2015 1333  ? ALT 19 10/20/2018 0612  ? ALT 26 04/18/2015 1333  ? ALKPHOS 59 10/20/2018 0612  ? ALKPHOS 59 04/18/2015 1333  ? BILITOT 1.2 10/20/2018 0612  ? BILITOT 0.8 04/18/2015 1333  ? GFRNONAA >60 02/28/2022 1046  ? GFRNONAA >60 04/18/2015 1333  ? GFRAA >60 10/20/2018 0612  ? GFRAA >60 04/18/2015 1333  ? ?Lab Results  ?Component Value Date  ? WBC 8.9 02/28/2022  ? NEUTROABS 4.2 10/20/2018  ? HGB 13.2 02/28/2022  ?  HCT 39.5 02/28/2022  ? MCV 95.0 02/28/2022  ? PLT 283 02/28/2022  ? ? ?Imaging: ?CT HEAD WO CONTRAST ? ?Result Date: 02/28/2022 ?CLINICAL DATA:  CNS neoplasm, status post stereotactic brain biopsy EXAM: CT HEAD WITH

## 2022-03-20 NOTE — Progress Notes (Signed)
Wheelchair order placed through parach[facility] ?

## 2022-03-23 ENCOUNTER — Encounter: Payer: Self-pay | Admitting: Cardiovascular Disease

## 2022-03-24 DIAGNOSIS — C719 Malignant neoplasm of brain, unspecified: Secondary | ICD-10-CM | POA: Diagnosis not present

## 2022-03-25 ENCOUNTER — Encounter: Payer: Self-pay | Admitting: Internal Medicine

## 2022-03-26 ENCOUNTER — Telehealth: Payer: Self-pay | Admitting: Pharmacist

## 2022-03-26 ENCOUNTER — Other Ambulatory Visit: Payer: Self-pay | Admitting: Internal Medicine

## 2022-03-26 ENCOUNTER — Telehealth: Payer: Self-pay | Admitting: Internal Medicine

## 2022-03-26 ENCOUNTER — Other Ambulatory Visit (HOSPITAL_COMMUNITY): Payer: Self-pay

## 2022-03-26 ENCOUNTER — Telehealth: Payer: Self-pay

## 2022-03-26 ENCOUNTER — Encounter: Payer: Self-pay | Admitting: Internal Medicine

## 2022-03-26 DIAGNOSIS — C719 Malignant neoplasm of brain, unspecified: Secondary | ICD-10-CM

## 2022-03-26 MED ORDER — ONDANSETRON HCL 8 MG PO TABS
8.0000 mg | ORAL_TABLET | Freq: Two times a day (BID) | ORAL | 1 refills | Status: AC | PRN
  Filled 2022-03-26 (×2): qty 30, 15d supply, fill #0

## 2022-03-26 MED ORDER — TEMOZOLOMIDE 100 MG PO CAPS
150.0000 mg/m2/d | ORAL_CAPSULE | Freq: Every day | ORAL | 0 refills | Status: AC
  Filled 2022-03-26: qty 15, 5d supply, fill #0
  Filled 2022-03-26: qty 15, 28d supply, fill #0

## 2022-03-26 NOTE — Telephone Encounter (Signed)
Oral Oncology Pharmacist Encounter ? ?Received new prescription for Temodar (temozolomide) for the treatment of glioblastoma, IDH-wild type, WHO grade 4, planned duration 6-12 cycles pending tolerance. ? ?CBC from 02/28/22 and BMP from 02/25/22 assessed, noted Scr of 1.28 mg/dL (CrCl ~39 mL/min). No dose adjustments recommended per package insert. Prescription dose and frequency assessed for appropriateness. ? ?Current medication list in Epic reviewed, no relevant/significant DDIs with Temodar identified. ? ?Evaluated chart and no patient barriers to medication adherence noted.  ? ?Prescription has been e-scribed to the [facility] for benefits analysis and approval. ? ?Oral Oncology Clinic will continue to follow for insurance authorization, copayment issues, initial counseling and start date. ? ?Leron Croak, PharmD, BCPS ?Hematology/Oncology Clinical Pharmacist ?[facility] Oral Chemotherapy Navigation Clinics ?[phone] ?03/26/2022 12:14 PM ? ?

## 2022-03-26 NOTE — Telephone Encounter (Signed)
Pt's son, Zachary Spence, has been advised regarding Temodar Rx and that the chemo pharmacist will be contacting him to discuss Temodar instructions. ? ?His MRI has been scheduled for Friday 03/28/22 at 5:00 ? ?Beverly in scheduling has been advised to schedule a follow up appt 5/8 or 5/9 ?

## 2022-03-26 NOTE — Progress Notes (Signed)
START ON PATHWAY REGIMEN - Neuro ? ? ?  A cycle is every 28 days: ?    Temozolomide  ? ?**Always confirm dose/schedule in your pharmacy ordering system** ? ?Patient Characteristics: ?Glioma, Glioblastoma, IDH-wildtype, Newly Diagnosed / Treatment Naive, Poor Performance Status and/or Elderly Patient ?Disease Classification: Glioma ?Disease Classification: Glioblastoma, IDH-wildtype ?Disease Status: Newly Diagnosed / Treatment Naive ?Performance Status: Poor Performance Status and/or Elderly Patient ?Intent of Therapy: ?Non-Curative / Palliative Intent, Discussed with Patient ?

## 2022-03-26 NOTE — Telephone Encounter (Signed)
Oral Chemotherapy Pharmacist Encounter ? ?I spoke with patient's son, Quinnlan Abruzzo, for overview of: Temodar (temozolomide) for the treatment of glioblastoma, IDH-wild type, WHO grade 4, planned duration 6-12 cycles pending tolerance. ? ?Counseled on administration, dosing, side effects, monitoring, drug-food interactions, safe handling, storage, and disposal. ? ?Patient will take Temodar 154m capsules, 3 capsules, 3033mtotal daily dose, by mouth once daily, may take at bedtime and on an empty stomach to decrease nausea and vomiting. ? ?Patient will take Temodar daily for 5 days on, 23 days off, and repeated. ? ?Temodar start date: 03/30/22 PM ?  ?Patient will take Zofran 23m71mablet, 1 tablet by mouth 30-60 min prior to Temodar dose to help decrease N/V. ?  ?Adverse effects include but are not limited to: nausea, vomiting, anorexia, GI upset, rash, drug fever, and fatigue. Rare but serious adverse effects of pneumocystis pneumonia and secondary malignancy also discussed. ? ?Discussed with patient's son strategies to manage constipation if they occur secondary to ondansetron dosing. ? ?PCP prophylaxis will not be initiated at this time, but may be added based on lymphocyte count in the future. ? ?Reviewed importance of keeping a medication schedule and plan for any missed doses. No barriers to medication adherence identified. ? ?Medication reconciliation performed and medication/allergy list updated. ? ?Insurance authorization for Temodar has been obtained. ?Test claim at the pharmacy revealed copayment $115 for 1st fill of Temodar. ?This will ship from the [facility] 03/26/22 to deliver to patient's home on 03/27/22. ? ?Patient's son informed the pharmacy will reach out 5-7 days prior to needing next fill of Temodar to coordinate continued medication acquisition to prevent break in therapy. ? ?All questions answered. ? ?DavGreig Castillaiced understanding and appreciation.  ? ?Medication education  handout and medication calendar placed in mail for patient and patient's family. Patient's family knows to call the office with questions or concerns. Oral Chemotherapy Clinic phone number provided.  ? ?RebLeron CroakharmD, BCPS ?Hematology/Oncology Clinical Pharmacist ?[phone]/04/2022 2:09 PM ? ? ? ?

## 2022-03-26 NOTE — Telephone Encounter (Signed)
Oral Oncology Patient Advocate Encounter ? ?After completing a benefits investigation, prior authorization for Temodar is not required at this time through [facility]. ? ?Patient's copay is $115.   ? ?Wynn Maudlin CPHT ?Specialty Pharmacy Patient Advocate ?[facility] ?Phone [phone] ?Fax [phone] ?03/26/2022 1:38 PM ? ?  ? ?

## 2022-03-26 NOTE — Telephone Encounter (Signed)
Scheduled per 4/5 secure chat, pts son has been called and confirmed ?

## 2022-03-27 ENCOUNTER — Other Ambulatory Visit (HOSPITAL_COMMUNITY): Payer: Self-pay

## 2022-03-27 DIAGNOSIS — R350 Frequency of micturition: Secondary | ICD-10-CM | POA: Diagnosis not present

## 2022-03-28 ENCOUNTER — Ambulatory Visit (HOSPITAL_COMMUNITY)
Admission: RE | Admit: 2022-03-28 | Discharge: 2022-03-28 | Disposition: A | Discharge status: Home / Self Care | Payer: Medicare Other | Source: Ambulatory Visit | Attending: Internal Medicine | Admitting: Internal Medicine

## 2022-03-28 DIAGNOSIS — C719 Malignant neoplasm of brain, unspecified: Secondary | ICD-10-CM | POA: Insufficient documentation

## 2022-03-28 DIAGNOSIS — G9389 Other specified disorders of brain: Secondary | ICD-10-CM | POA: Diagnosis not present

## 2022-03-28 MED ORDER — GADOBUTROL 1 MMOL/ML IV SOLN
8.0000 mL | Freq: Once | INTRAVENOUS | Status: AC | PRN
  Administered 2022-03-28: 8 mL via INTRAVENOUS

## 2022-03-29 ENCOUNTER — Encounter: Payer: Self-pay | Admitting: Internal Medicine

## 2022-03-31 ENCOUNTER — Encounter (HOSPITAL_COMMUNITY): Payer: Self-pay

## 2022-03-31 ENCOUNTER — Encounter: Payer: Self-pay | Admitting: Internal Medicine

## 2022-03-31 ENCOUNTER — Other Ambulatory Visit: Payer: Self-pay

## 2022-03-31 ENCOUNTER — Emergency Department (HOSPITAL_COMMUNITY)
Admission: EM | Admit: 2022-03-31 | Discharge: 2022-03-31 | Disposition: A | Discharge status: Home / Self Care | Payer: Medicare Other | Attending: Emergency Medicine | Admitting: Emergency Medicine

## 2022-03-31 DIAGNOSIS — R339 Retention of urine, unspecified: Secondary | ICD-10-CM | POA: Insufficient documentation

## 2022-03-31 DIAGNOSIS — Z79899 Other long term (current) drug therapy: Secondary | ICD-10-CM | POA: Diagnosis not present

## 2022-03-31 DIAGNOSIS — R338 Other retention of urine: Secondary | ICD-10-CM

## 2022-03-31 LAB — CBC WITH DIFFERENTIAL/PLATELET
Abs Immature Granulocytes: 0.08 10*3/uL — ABNORMAL HIGH (ref 0.00–0.07)
Basophils Absolute: 0 10*3/uL (ref 0.0–0.1)
Basophils Relative: 0 %
Eosinophils Absolute: 0 10*3/uL (ref 0.0–0.5)
Eosinophils Relative: 0 %
HCT: 43.5 % (ref 39.0–52.0)
Hemoglobin: 14.1 g/dL (ref 13.0–17.0)
Immature Granulocytes: 1 %
Lymphocytes Relative: 8 %
Lymphs Abs: 1.1 10*3/uL (ref 0.7–4.0)
MCH: 31.3 pg (ref 26.0–34.0)
MCHC: 32.4 g/dL (ref 30.0–36.0)
MCV: 96.7 fL (ref 80.0–100.0)
Monocytes Absolute: 1 10*3/uL (ref 0.1–1.0)
Monocytes Relative: 8 %
Neutro Abs: 11 10*3/uL — ABNORMAL HIGH (ref 1.7–7.7)
Neutrophils Relative %: 83 %
Platelets: 287 10*3/uL (ref 150–400)
RBC: 4.5 MIL/uL (ref 4.22–5.81)
RDW: 14.3 % (ref 11.5–15.5)
WBC: 13.3 10*3/uL — ABNORMAL HIGH (ref 4.0–10.5)
nRBC: 0 % (ref 0.0–0.2)

## 2022-03-31 LAB — URINALYSIS, ROUTINE W REFLEX MICROSCOPIC
Bilirubin Urine: NEGATIVE
Glucose, UA: NEGATIVE mg/dL
Hgb urine dipstick: NEGATIVE
Ketones, ur: NEGATIVE mg/dL
Leukocytes,Ua: NEGATIVE
Nitrite: NEGATIVE
Protein, ur: NEGATIVE mg/dL
Specific Gravity, Urine: 1.01 (ref 1.005–1.030)
pH: 7 (ref 5.0–8.0)

## 2022-03-31 LAB — BASIC METABOLIC PANEL
Anion gap: 8 (ref 5–15)
BUN: 35 mg/dL — ABNORMAL HIGH (ref 8–23)
CO2: 24 mmol/L (ref 22–32)
Calcium: 8.9 mg/dL (ref 8.9–10.3)
Chloride: 101 mmol/L (ref 98–111)
Creatinine, Ser: 1.28 mg/dL — ABNORMAL HIGH (ref 0.61–1.24)
GFR, Estimated: 51 mL/min — ABNORMAL LOW (ref >60)
Glucose, Bld: 197 mg/dL — ABNORMAL HIGH (ref 70–99)
Potassium: 4.8 mmol/L (ref 3.5–5.1)
Sodium: 133 mmol/L — ABNORMAL LOW (ref 135–145)

## 2022-03-31 MED ORDER — CALCIUM CARBONATE ANTACID 500 MG PO CHEW
1.0000 | CHEWABLE_TABLET | Freq: Once | ORAL | Status: AC
  Administered 2022-03-31: 200 mg via ORAL
  Filled 2022-03-31: qty 1

## 2022-03-31 NOTE — ED Triage Notes (Signed)
Pt states that he has been dribbling urine over the past 24 hrs. Pt reports having a full feelings and pain in his groin.  ?

## 2022-03-31 NOTE — ED Provider Notes (Signed)
?[facility] DEPT ?Provider Note ? ? ?CSN: [id number] ?Arrival date & time: 03/31/22  1953 ? ?  ? ?History ? ?Chief Complaint  ?Patient presents with  ? Urinary Retention  ? ? ?Zachary Spence is a 88 y.o. male. ? ?HPI ? ?88 year old male presenting to the emergency department with a chief complaint of urinary retention.  The patient states that he has been inconsistently dribbling urine over the past 24 hours and incompletely voiding.  He has been having feelings of bladder fullness and pelvic pain associated with this.  He has a history of prior CVA, bowel obstruction, DM 2, non-Hodgkin's lymphoma, recent diagnosis of glioblastoma.  No nausea or vomiting.  No fevers or chills. ? ?Home Medications ?Prior to Admission medications   ?Medication Sig Start Date End Date Taking? Authorizing Provider  ?acetaminophen (TYLENOL) 325 MG tablet Take 2 tablets (650 mg total) by mouth every 4 (four) hours as needed for mild pain (or temp > 37.5 C (99.5 F)). 11/03/18   Cathlyn Parsons, PA-C  ?Apoaequorin (PREVAGEN) 10 MG CAPS Take 10 mg by mouth daily.    [provider]  ?calcium carbonate (OS-CAL - DOSED IN MG OF ELEMENTAL CALCIUM) 1250 (500 Ca) MG tablet Take 1 tablet (1,250 mg total) by mouth daily with breakfast. 11/03/18   Angiulli, Lavon Paganini, PA-C  ?dexamethasone (DECADRON) 4 MG tablet Take 1 tablet (4 mg total) by mouth daily. 03/20/22   Ventura Sellers, MD  ?folic acid (FOLVITE) 1 MG tablet Take 1 tablet (1 mg total) by mouth daily. 11/03/18   Angiulli, Lavon Paganini, PA-C  ?glipiZIDE (GLUCOTROL) 5 MG tablet Take 1 tablet (5 mg total) by mouth daily. ?Patient taking differently: Take 2.5 mg by mouth daily. 11/03/18   Angiulli, Lavon Paganini, PA-C  ?metoprolol succinate (TOPROL XL) 25 MG 24 hr tablet Take 0.5 tablets (12.5 mg total) by mouth every other day. 03/17/22   Troy Sine, MD  ?Multiple Vitamin (MULTIVITAMIN WITH MINERALS) TABS tablet Take 1 tablet by mouth daily.    [provider]  ?Multiple Vitamins-Minerals (PRESERVISION AREDS 2 PO) Take 1 capsule by mouth 2 (two) times daily.    [provider]  ?Omega-3 Fatty Acids (FISH OIL) 1000 MG CAPS Take 1,000 mg by mouth daily.     [provider]  ?ondansetron (ZOFRAN) 8 MG tablet Take 1 tablet by mouth 2 times daily as needed (nausea and vomiting). May take 30 - 60 minutes prior to Temodar administration if nausea/vomiting occurs. 03/26/22   Ventura Sellers, MD  ?rosuvastatin (CRESTOR) 5 MG tablet TAKE 1 TABLET BY MOUTH  DAILY AT 6 PM. ?Patient taking differently: Take 5 mg by mouth at bedtime. 09/26/21   Troy Sine, MD  ?sodium chloride (OCEAN) 0.65 % SOLN nasal spray Place 1 spray into both nostrils daily at 12 noon.    [provider]  ?temozolomide (TEMODAR) 100 MG capsule Take 3 capsules (300 mg total) by mouth daily. May take on an empty stomach to decrease nausea & vomiting. 03/26/22   Ventura Sellers, MD  ?vitamin B-12 (CYANOCOBALAMIN) 500 MCG tablet Take 500 mcg by mouth daily.    [provider]  ?   ? ?Allergies    ?Erythromycin   ? ?Review of Systems   ?Review of Systems  ?All other systems reviewed and are negative. ? ?Physical Exam ?Updated Vital Signs ?BP 133/90 (BP Location: Left Arm)   Pulse 70   Temp 98 ?F (36.7 ?  C) (Oral)   Resp 20   Ht '5\' 11"'$  (1.803 m)   Wt 81.6 kg   SpO2 95%   BMI 25.10 kg/m?  ?Physical Exam ?Vitals and nursing note reviewed.  ?Constitutional:   ?   General: He is not in acute distress. ?   Appearance: He is well-developed.  ?HENT:  ?   Head: Normocephalic and atraumatic.  ?Eyes:  ?   Conjunctiva/sclera: Conjunctivae normal.  ?Cardiovascular:  ?   Rate and Rhythm: Normal rate and regular rhythm.  ?   Heart sounds: No murmur heard. ?Pulmonary:  ?   Effort: Pulmonary effort is normal. No respiratory distress.  ?   Breath sounds: Normal breath sounds.  ?Abdominal:  ?   Palpations: Abdomen is soft.  ?   Tenderness: There is abdominal tenderness in the  suprapubic area. There is no guarding or rebound.  ?Musculoskeletal:     ?   General: No swelling.  ?   Cervical back: Neck supple.  ?Skin: ?   General: Skin is warm and dry.  ?   Capillary Refill: Capillary refill takes less than 2 seconds.  ?Neurological:  ?   Mental Status: He is alert.  ?Psychiatric:     ?   Mood and Affect: Mood normal.  ? ? ?ED Results / Procedures / Treatments   ?Labs ?(all labs ordered are listed, but only abnormal results are displayed) ?Labs Reviewed  ?CBC WITH DIFFERENTIAL/PLATELET - Abnormal; Notable for the following components:  ?    Result Value  ? WBC 13.3 (*)   ? Neutro Abs 11.0 (*)   ? Abs Immature Granulocytes 0.08 (*)   ? All other components within normal limits  ?BASIC METABOLIC PANEL - Abnormal; Notable for the following components:  ? Sodium 133 (*)   ? Glucose, Bld 197 (*)   ? BUN 35 (*)   ? Creatinine, Ser 1.28 (*)   ? GFR, Estimated 51 (*)   ? All other components within normal limits  ?URINALYSIS, ROUTINE W REFLEX MICROSCOPIC  ? ? ?EKG ?None ? ?Radiology ?No results found. ? ?Procedures ?Ultrasound ED Renal ? ?Date/Time: 04/01/2022 7:11 PM ?Performed by: Regan Lemming, MD ?Authorized by: Regan Lemming, MD  ? ?Procedure details:  ?  Indications: urinary retention   ?  Technique:  BladderImages: not archived ?Bladder findings:  ?  Bladder:  Visualized ?  Free pelvic fluid: not identified   ?Comments:  ?   Large distended bladder present  ? ? ?Medications Ordered in ED ?Medications  ?calcium carbonate (TUMS - dosed in mg elemental calcium) chewable tablet 200 mg of elemental calcium (200 mg of elemental calcium Oral Given 03/31/22 2331)  ? ? ?ED Course/ Medical Decision Making/ A&P ?  ?                        ?Medical Decision Making ?Amount and/or Complexity of Data Reviewed ?Labs: ordered. ? ?Risk ?OTC drugs. ? ? ? ?88 year old male presenting to the emergency department with a chief complaint of urinary retention.  The patient states that he has been inconsistently  dribbling urine over the past 24 hours and incompletely voiding.  He has been having feelings of bladder fullness and pelvic pain associated with this.  He has a history of prior CVA, bowel obstruction, DM 2, non-Hodgkin's lymphoma, recent diagnosis of glioblastoma.  No nausea or vomiting.  No fevers or chills. ? ?On arrival, the patient was hypertensive BP 169/86, otherwise vitally stable.  Sinus rhythm noted on cardiac telemetry.  Presenting with concern for urinary retention over the past 4 hours with inability to void and dribbling urine.  A bladder scan/ultrasound was performed and revealed a large distended bladder present.  Patient unable to void in the ED.  Concern for acute urinary retention.  A Foley catheter was placed  with subsequent relief of symptoms.  The patient on repeat assessment had no abdominal pain or tenderness with a soft, nontender, nondistended abdomen.  No concern for bowel obstruction at this time.  The patient had a bowel movement in the emergency department.  Screening laboratory evaluation significant for urinalysis negative for UTI, BMP with elevated blood glucose to 197, BMP with an elevated serum creatinine to 1.28 from 1.04 66-monthago.  With acute urinary retention, suspect obstruction as a cause of the patient's worsening renal function.  With relief of symptoms, patient overall stable for discharge at this time with close urology follow-up.  Family members educated on Foley catheter care maintenance by nursing staff prior to discharge.  Referral placed for follow-up with urology in clinic. ? ?Final Clinical Impression(s) / ED Diagnoses ?Final diagnoses:  ?Acute urinary retention  ? ? ?Rx / DC Orders ?ED Discharge Orders   ? ?      Ordered  ?  Ambulatory referral to Urology       ? 03/31/22 2303  ? ?  ?  ? ?  ? ? ?  ?LRegan Lemming MD ?04/01/22 1914 ? ?

## 2022-03-31 NOTE — ED Notes (Signed)
Patient and son (caregiver) educated on foley care and maintenance. Demonstration provided.  ?

## 2022-03-31 NOTE — Discharge Instructions (Addendum)
You were evaluated in the Emergency Department and after careful evaluation, we did not find any emergent condition requiring admission or further testing in the hospital. ? ?Your exam/testing today was concerning for acute urinary retention.  A Foley catheter has been placed.  Please keep that in place until follow-up with urology. ? ?Please return to the Emergency Department if you experience any worsening of your condition.  Thank you for allowing [place] to be a part of your care. ? ?

## 2022-04-01 ENCOUNTER — Encounter: Payer: Self-pay | Admitting: Internal Medicine

## 2022-04-02 ENCOUNTER — Encounter: Payer: Self-pay | Admitting: Internal Medicine

## 2022-04-02 ENCOUNTER — Telehealth: Payer: Self-pay

## 2022-04-02 NOTE — Telephone Encounter (Signed)
Contacted pt's son regarding his MyChart message. Pt's son states pt has constant feeling of needing to have a BM but has been leaking liquid stool. Pt has been up inn the bathroom hourly with no results. Encouraged son to have pt use stool softener on a regular basis.  Pt appears to be constipated and is leaking stool around the hard stool. Pt's son verbalized understanding of education about using  MOM and  stool softeners. Pt to use a suppository if no results and and will call back if this continues to be an issue, Encouraged son to use some type of barrier on pt's reddened skin like Vaseline or Aquaphor. Pt's son acknowledged above.    ?

## 2022-04-08 DIAGNOSIS — R339 Retention of urine, unspecified: Secondary | ICD-10-CM | POA: Diagnosis not present

## 2022-04-11 ENCOUNTER — Encounter: Payer: Self-pay | Admitting: Internal Medicine

## 2022-04-11 DIAGNOSIS — R339 Retention of urine, unspecified: Secondary | ICD-10-CM | POA: Diagnosis not present

## 2022-04-14 ENCOUNTER — Encounter: Payer: Self-pay | Admitting: Internal Medicine

## 2022-04-14 ENCOUNTER — Other Ambulatory Visit: Payer: Self-pay | Admitting: Internal Medicine

## 2022-04-14 MED ORDER — TRAZODONE HCL 50 MG PO TABS: 50.0000 mg | ORAL_TABLET | Freq: Every day | ORAL | 2 refills | Status: AC

## 2022-04-15 ENCOUNTER — Other Ambulatory Visit (HOSPITAL_COMMUNITY): Payer: Self-pay

## 2022-04-16 ENCOUNTER — Other Ambulatory Visit (HOSPITAL_COMMUNITY): Payer: Self-pay

## 2022-04-18 ENCOUNTER — Encounter: Payer: Self-pay | Admitting: Internal Medicine

## 2022-04-21 ENCOUNTER — Encounter: Payer: Self-pay | Admitting: Internal Medicine

## 2022-04-21 ENCOUNTER — Other Ambulatory Visit: Payer: Self-pay | Admitting: *Deleted

## 2022-04-21 DIAGNOSIS — C719 Malignant neoplasm of brain, unspecified: Secondary | ICD-10-CM

## 2022-04-21 NOTE — Progress Notes (Signed)
Spoke with patients son.  He confirmed rapid decline of patient and he does agree with hospice being the next step.  Order Placed and called to [facility]. ?

## 2022-04-23 DIAGNOSIS — C719 Malignant neoplasm of brain, unspecified: Secondary | ICD-10-CM | POA: Diagnosis not present

## 2022-04-28 ENCOUNTER — Inpatient Hospital Stay: Payer: Medicare Other | Admitting: Internal Medicine

## 2022-04-28 ENCOUNTER — Other Ambulatory Visit (HOSPITAL_COMMUNITY): Payer: Self-pay

## 2022-05-12 ENCOUNTER — Telehealth: Payer: Self-pay | Admitting: *Deleted

## 2022-05-12 ENCOUNTER — Other Ambulatory Visit (HOSPITAL_COMMUNITY): Payer: Self-pay

## 2022-05-12 NOTE — Telephone Encounter (Signed)
Received notification from [facility].  Patient deceased at [facility] on 2022/05/17

## 2022-05-22 DEATH — deceased

## 2022-06-04 ENCOUNTER — Ambulatory Visit: Payer: Medicare Other | Admitting: Podiatry

## 2023-02-23 ENCOUNTER — Encounter (INDEPENDENT_AMBULATORY_CARE_PROVIDER_SITE_OTHER): Payer: Medicare Other | Admitting: Ophthalmology

## 2024-03-22 IMAGING — MR MR HEAD WO/W CM
15 series · 48 of 48 positions shown · IV contrast (GADAVIST)
Comparison: Brain MRI most recently 02/09/2022, head CT [DATE] and
02/28/2022

CLINICAL DATA: GBM

EXAM:
MRI HEAD WITHOUT AND WITH CONTRAST
TECHNIQUE: Multiplanar, multiecho pulse sequences of the brain and surrounding
structures were obtained without and with intravenous contrast.
CONTRAST:  8mL GADAVIST GADOBUTROL 1 MMOL/ML IV SOLN

[Series 9: DWI · axial · 3.0mm · 1.36mm/px · z∈[-46,+119]mm · 5 of 96 slices shown (1 of 2)]
[im 1/96]
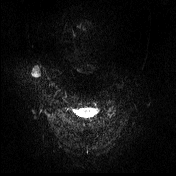
[im 24/96]
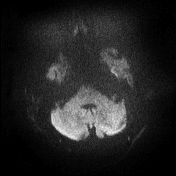
[im 48/96]
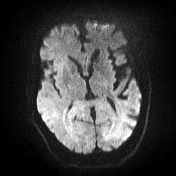
[im 72/96]
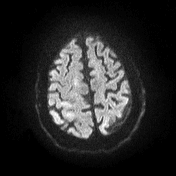
[im 96/96]
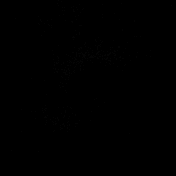

[Series 10: DWI · axial · 3.0mm · 1.36mm/px · z∈[-46,+115]mm · 3 of 47 slices shown (2 of 2)]
[im 1/47]
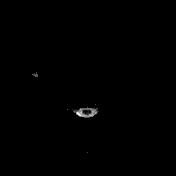
[im 24/47]
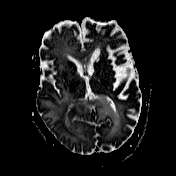
[im 47/47]
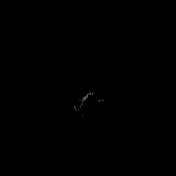

[Series 11: T1 · sagittal · 5.0mm · 0.75mm/px · 1 of 24 slices shown (1 of 4)]
[im 1/24]
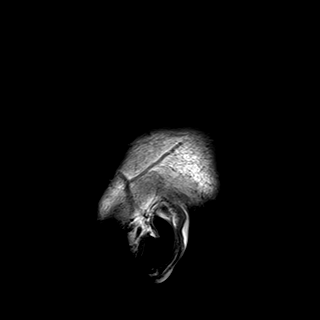

[Series 12: T2 · axial · 5.0mm · 0.62mm/px · 1 of 26 slices shown]
[im 1/26]
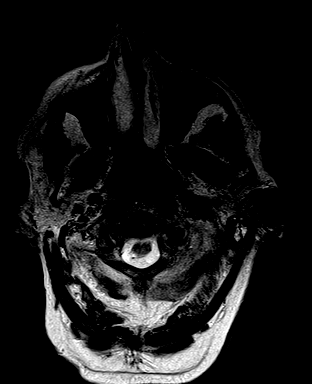

[Series 13: swi_images · axial · 3.0mm · 0.75mm/px · z∈[-39,+129]mm · 3 of 60 slices shown]
[im 1/60]
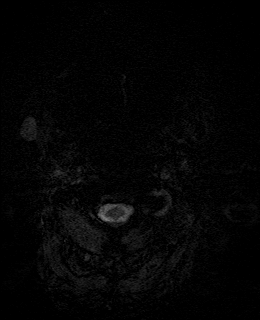
[im 30/60]
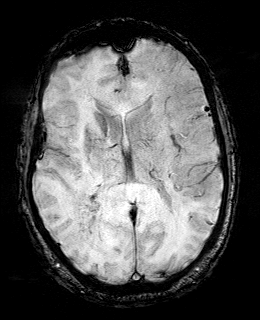
[im 60/60]
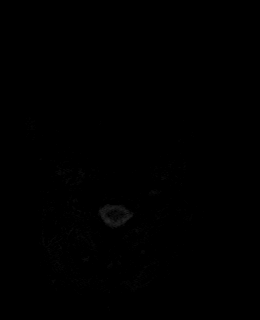

[Series 15: FLAIR · axial · 3.0mm · 0.75mm/px · z∈[-27,+118]mm · 3 of 52 slices shown]
[im 1/52]
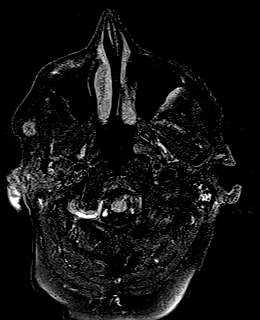
[im 26/52]
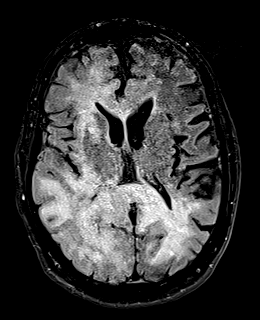
[im 52/52]
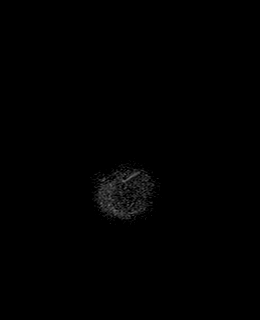

[Series 16: T1 · axial · 1.0mm · 0.94mm/px · z∈[-30,+121]mm · 9 of 160 slices shown (2 of 4)]
[im 1/160]
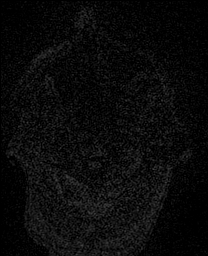
[im 20/160]
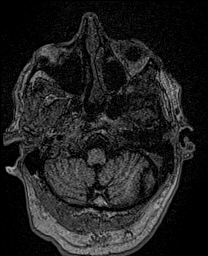
[im 40/160]
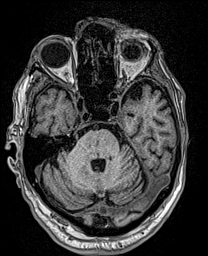
[im 60/160]
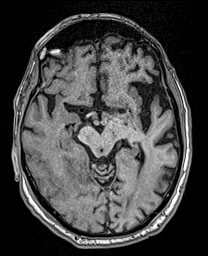
[im 80/160]
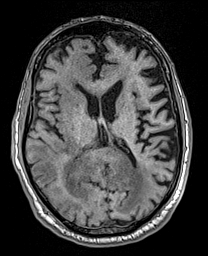
[im 100/160]
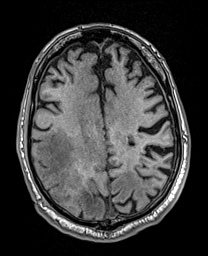
[im 120/160]
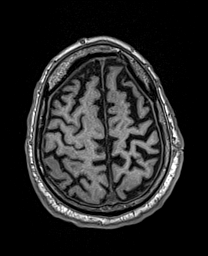
[im 140/160]
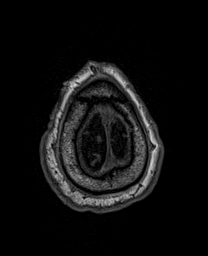
[im 160/160]
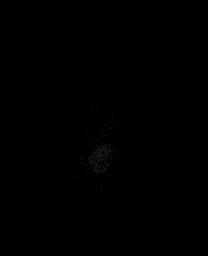

[Series 17: cor dwi_tracew · coronal · 5.0mm · 1.53mm/px · 3 of 56 slices shown]
[im 1/56]
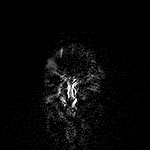
[im 28/56]
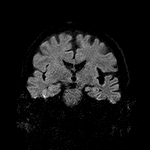
[im 56/56]
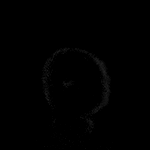

[Series 18: cor dwi_adc · coronal · 5.0mm · 1.53mm/px · 1 of 27 slices shown]
[im 1/27]
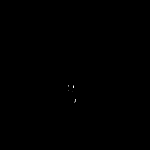

[Series 19: T2 post-contrast · coronal · 5.0mm · 0.57mm/px · 2 of 28 slices shown]
[im 1/28]
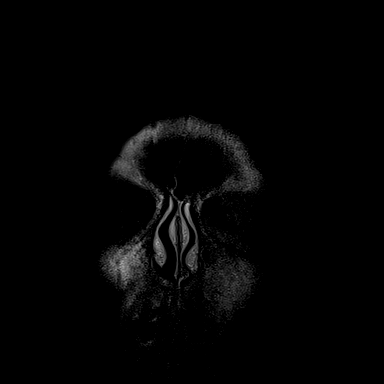
[im 28/28]
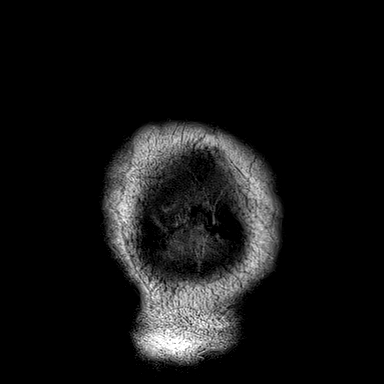

[Series 20: T1 post-contrast · axial · 1.0mm · 0.94mm/px · z∈[-30,+121]mm · 9 of 160 slices shown (1 of 3)]
[im 1/160]
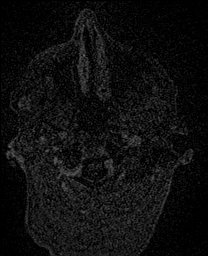
[im 20/160]
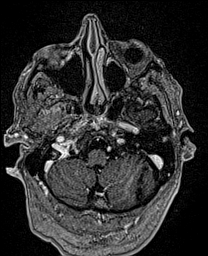
[im 40/160]
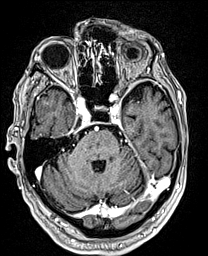
[im 60/160]
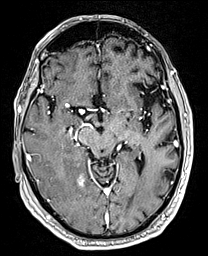
[im 80/160]
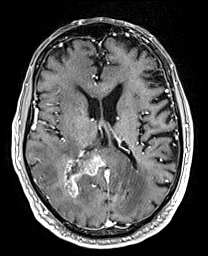
[im 100/160]
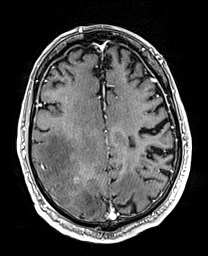
[im 120/160]
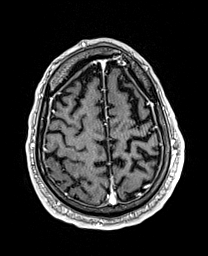
[im 140/160]
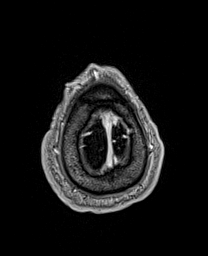
[im 160/160]
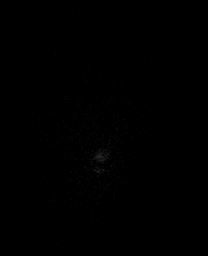

[Series 21: T1 · sagittal · 4.0mm · 0.94mm/px · 2 of 38 slices shown (3 of 4)]
[im 1/38]
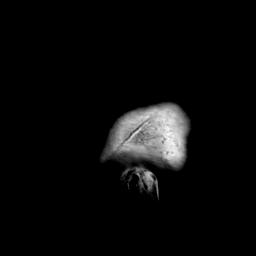
[im 38/38]
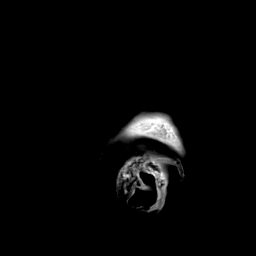

[Series 22: T1 · coronal · 4.0mm · 0.94mm/px · 3 of 47 slices shown (4 of 4)]
[im 1/47]
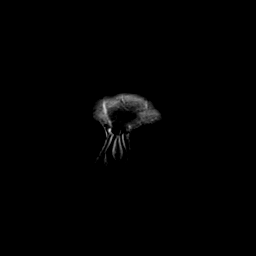
[im 24/47]
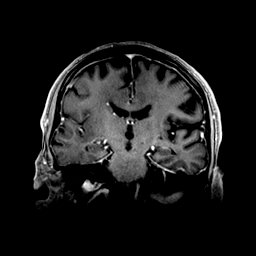
[im 47/47]
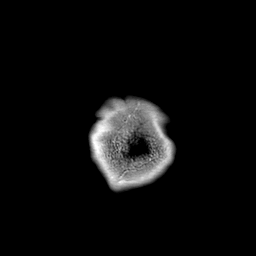

[Series 23: T1 post-contrast · coronal · 5.0mm · 0.43mm/px · 2 of 28 slices shown (2 of 3)]
[im 1/28]
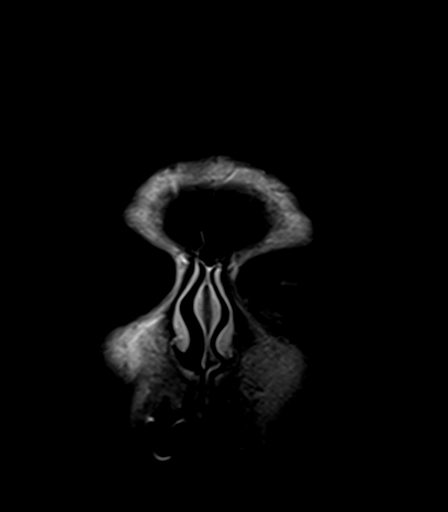
[im 28/28]
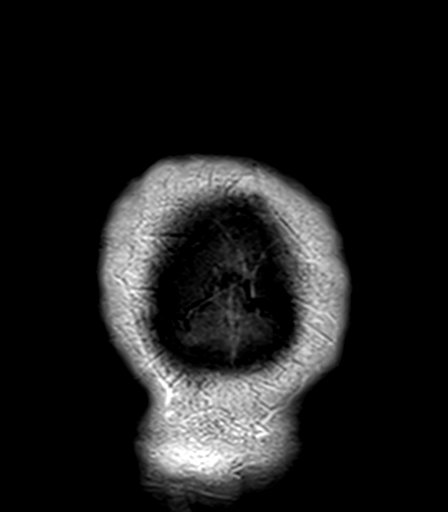

[Series 24: T1 post-contrast · sagittal · 5.0mm · 0.75mm/px · 1 of 24 slices shown (3 of 3)]
[im 1/24]
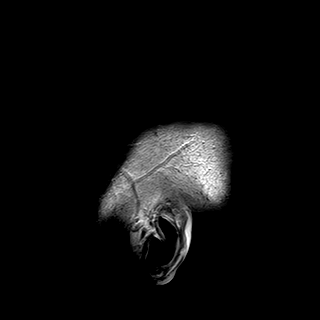

[48 of 48 positions shown; findings below may reference images not displayed]

FINDINGS: Brain: An infiltrative heterogeneously enhancing mass lesion is
again seen in the right periatrial white matter consistent with the
history of glioblastoma. The enhancing component today measures
approximately 4.4 cm x 2.6 cm in the axial plane, increased from
cm x 1.6 cm on the prior study from 02/09/2022 (20-84). The
enhancement extends further medially in the splenium of the corpus
callosum and approaches but does not cross midline. The enhancement
also extends more inferiorly towards the tail of the hippocampus.

Extensive FLAIR signal abnormality throughout the right parietal
lobe and occipital lobe extending into the frontal and temporal
lobes is again seen, overall not significantly changed. The FLAIR
signal abnormality crosses midline through the corpus callosum and
involves much of the left parietal and occipital lobes. The signal
abnormality in the corpus callosum appears more masslike on the
current study compared to the prior. There is also more masslike
FLAIR signal abnormality in the medial parietal lobe (15-31). There
is mild regional mass effect with sulcal effacement primarily in the
right parietal lobe but no midline shift.

SWI signal dropout within the region of signal abnormality is
consistent with prior biopsy. There is no frank hematoma.

The ventricles are stable in size. There is no evidence of acute
intracranial hemorrhage, extra-axial fluid collection, or acute
infarct. A left cerebellar hemisphere developmental venous anomaly
is again noted. Small remote lacunar infarcts are unchanged.

Vascular: Normal flow voids.

Skull and upper cervical spine: Normal marrow signal.

Sinuses/Orbits: Paranasal sinuses are clear. Bilateral lens implants
are in place. The globes and orbits are otherwise unremarkable.

Other: A left mastoid effusion is again seen.
IMPRESSION: Since 02/09/2022:

Extensive FLAIR signal abnormality centered in the right parietal
lobe crossing midline via the splenium of the corpus callosum is
similar in extent to the prior study, but appears more
expansile/masslike, and the enhancing component centered in the
right periatrial white matter has increased in size and now
approaches midline. Findings concerning for disease progression.
